# Patient Record
Sex: Female | Born: 1945
Health system: Southern US, Community
[De-identification: ages and names within clinical notes are randomized; demographics above are authoritative.]

## PROBLEM LIST (undated history)

## (undated) DIAGNOSIS — Z972 Presence of dental prosthetic device (complete) (partial): Secondary | ICD-10-CM

## (undated) DIAGNOSIS — D649 Anemia, unspecified: Secondary | ICD-10-CM

## (undated) DIAGNOSIS — K635 Polyp of colon: Secondary | ICD-10-CM

## (undated) DIAGNOSIS — E119 Type 2 diabetes mellitus without complications: Secondary | ICD-10-CM

## (undated) DIAGNOSIS — I10 Essential (primary) hypertension: Secondary | ICD-10-CM

## (undated) DIAGNOSIS — M1990 Unspecified osteoarthritis, unspecified site: Secondary | ICD-10-CM

## (undated) HISTORY — DX: Polyp of colon: K63.5

---

## 2014-06-02 DIAGNOSIS — E119 Type 2 diabetes mellitus without complications: Secondary | ICD-10-CM

## 2014-06-02 HISTORY — DX: Type 2 diabetes mellitus without complications: E11.9

## 2015-06-27 DIAGNOSIS — R030 Elevated blood-pressure reading, without diagnosis of hypertension: Secondary | ICD-10-CM | POA: Diagnosis not present

## 2015-06-27 DIAGNOSIS — J189 Pneumonia, unspecified organism: Secondary | ICD-10-CM | POA: Diagnosis not present

## 2015-06-29 DIAGNOSIS — I169 Hypertensive crisis, unspecified: Secondary | ICD-10-CM | POA: Diagnosis not present

## 2015-06-29 DIAGNOSIS — I10 Essential (primary) hypertension: Secondary | ICD-10-CM | POA: Diagnosis not present

## 2015-06-29 DIAGNOSIS — R062 Wheezing: Secondary | ICD-10-CM | POA: Diagnosis not present

## 2015-06-29 DIAGNOSIS — Z1239 Encounter for other screening for malignant neoplasm of breast: Secondary | ICD-10-CM | POA: Diagnosis not present

## 2015-06-29 DIAGNOSIS — D649 Anemia, unspecified: Secondary | ICD-10-CM | POA: Diagnosis not present

## 2015-07-03 ENCOUNTER — Other Ambulatory Visit: Payer: Self-pay | Admitting: Nurse Practitioner

## 2015-07-03 DIAGNOSIS — Z1239 Encounter for other screening for malignant neoplasm of breast: Secondary | ICD-10-CM

## 2015-07-05 DIAGNOSIS — I10 Essential (primary) hypertension: Secondary | ICD-10-CM | POA: Diagnosis not present

## 2015-07-05 DIAGNOSIS — D508 Other iron deficiency anemias: Secondary | ICD-10-CM | POA: Diagnosis not present

## 2015-07-11 ENCOUNTER — Ambulatory Visit
Admission: RE | Admit: 2015-07-11 | Discharge: 2015-07-11 | Disposition: A | Discharge status: Home / Self Care | Payer: Commercial Managed Care - HMO | Source: Ambulatory Visit | Attending: Nurse Practitioner | Admitting: Nurse Practitioner

## 2015-07-11 ENCOUNTER — Other Ambulatory Visit: Payer: Self-pay | Admitting: Nurse Practitioner

## 2015-07-11 DIAGNOSIS — Z1231 Encounter for screening mammogram for malignant neoplasm of breast: Secondary | ICD-10-CM | POA: Diagnosis not present

## 2015-07-11 DIAGNOSIS — Z1239 Encounter for other screening for malignant neoplasm of breast: Secondary | ICD-10-CM

## 2015-07-11 DIAGNOSIS — Z0000 Encounter for general adult medical examination without abnormal findings: Secondary | ICD-10-CM | POA: Diagnosis not present

## 2015-07-11 DIAGNOSIS — I10 Essential (primary) hypertension: Secondary | ICD-10-CM | POA: Diagnosis not present

## 2015-07-12 ENCOUNTER — Other Ambulatory Visit: Payer: Self-pay | Admitting: Nurse Practitioner

## 2015-07-12 DIAGNOSIS — R928 Other abnormal and inconclusive findings on diagnostic imaging of breast: Secondary | ICD-10-CM

## 2015-07-26 ENCOUNTER — Ambulatory Visit
Admission: RE | Admit: 2015-07-26 | Discharge: 2015-07-26 | Disposition: A | Discharge status: Home / Self Care | Payer: Commercial Managed Care - HMO | Source: Ambulatory Visit | Attending: Nurse Practitioner | Admitting: Nurse Practitioner

## 2015-07-26 DIAGNOSIS — R928 Other abnormal and inconclusive findings on diagnostic imaging of breast: Secondary | ICD-10-CM | POA: Diagnosis not present

## 2015-07-26 DIAGNOSIS — N6489 Other specified disorders of breast: Secondary | ICD-10-CM | POA: Diagnosis not present

## 2015-10-09 DIAGNOSIS — D509 Iron deficiency anemia, unspecified: Secondary | ICD-10-CM | POA: Diagnosis not present

## 2015-10-09 DIAGNOSIS — I10 Essential (primary) hypertension: Secondary | ICD-10-CM | POA: Diagnosis not present

## 2015-10-09 DIAGNOSIS — E119 Type 2 diabetes mellitus without complications: Secondary | ICD-10-CM | POA: Diagnosis not present

## 2015-10-09 DIAGNOSIS — Z79899 Other long term (current) drug therapy: Secondary | ICD-10-CM | POA: Diagnosis not present

## 2015-10-09 DIAGNOSIS — E782 Mixed hyperlipidemia: Secondary | ICD-10-CM | POA: Diagnosis not present

## 2015-10-15 DIAGNOSIS — Z79899 Other long term (current) drug therapy: Secondary | ICD-10-CM | POA: Diagnosis not present

## 2015-10-15 DIAGNOSIS — E782 Mixed hyperlipidemia: Secondary | ICD-10-CM | POA: Diagnosis not present

## 2015-10-15 DIAGNOSIS — I10 Essential (primary) hypertension: Secondary | ICD-10-CM | POA: Diagnosis not present

## 2015-10-15 DIAGNOSIS — Z1211 Encounter for screening for malignant neoplasm of colon: Secondary | ICD-10-CM | POA: Diagnosis not present

## 2015-10-15 DIAGNOSIS — E119 Type 2 diabetes mellitus without complications: Secondary | ICD-10-CM | POA: Diagnosis not present

## 2015-10-15 DIAGNOSIS — D649 Anemia, unspecified: Secondary | ICD-10-CM | POA: Diagnosis not present

## 2015-11-13 DIAGNOSIS — D649 Anemia, unspecified: Secondary | ICD-10-CM | POA: Diagnosis not present

## 2015-11-16 DIAGNOSIS — Z1211 Encounter for screening for malignant neoplasm of colon: Secondary | ICD-10-CM | POA: Diagnosis not present

## 2015-11-16 DIAGNOSIS — Z800 Family history of malignant neoplasm of digestive organs: Secondary | ICD-10-CM | POA: Diagnosis not present

## 2016-01-09 ENCOUNTER — Ambulatory Visit: Payer: Commercial Managed Care - HMO | Admitting: Anesthesiology

## 2016-01-09 ENCOUNTER — Encounter: Payer: Self-pay | Admitting: *Deleted

## 2016-01-09 ENCOUNTER — Encounter
Admission: RE | Disposition: A | Discharge status: Home / Self Care | Payer: Self-pay | Source: Ambulatory Visit | Attending: Gastroenterology

## 2016-01-09 ENCOUNTER — Ambulatory Visit
Admission: RE | Admit: 2016-01-09 | Discharge: 2016-01-09 | Disposition: A | Discharge status: Home / Self Care | Payer: Commercial Managed Care - HMO | Source: Ambulatory Visit | Attending: Gastroenterology | Admitting: Gastroenterology

## 2016-01-09 DIAGNOSIS — D125 Benign neoplasm of sigmoid colon: Secondary | ICD-10-CM | POA: Diagnosis not present

## 2016-01-09 DIAGNOSIS — I10 Essential (primary) hypertension: Secondary | ICD-10-CM | POA: Diagnosis not present

## 2016-01-09 DIAGNOSIS — E119 Type 2 diabetes mellitus without complications: Secondary | ICD-10-CM | POA: Diagnosis not present

## 2016-01-09 DIAGNOSIS — D649 Anemia, unspecified: Secondary | ICD-10-CM | POA: Diagnosis not present

## 2016-01-09 DIAGNOSIS — D126 Benign neoplasm of colon, unspecified: Secondary | ICD-10-CM | POA: Diagnosis not present

## 2016-01-09 DIAGNOSIS — Z1211 Encounter for screening for malignant neoplasm of colon: Secondary | ICD-10-CM | POA: Insufficient documentation

## 2016-01-09 DIAGNOSIS — K5730 Diverticulosis of large intestine without perforation or abscess without bleeding: Secondary | ICD-10-CM | POA: Diagnosis not present

## 2016-01-09 DIAGNOSIS — D123 Benign neoplasm of transverse colon: Secondary | ICD-10-CM | POA: Insufficient documentation

## 2016-01-09 DIAGNOSIS — Z79899 Other long term (current) drug therapy: Secondary | ICD-10-CM | POA: Insufficient documentation

## 2016-01-09 DIAGNOSIS — Z7984 Long term (current) use of oral hypoglycemic drugs: Secondary | ICD-10-CM | POA: Insufficient documentation

## 2016-01-09 DIAGNOSIS — K5289 Other specified noninfective gastroenteritis and colitis: Secondary | ICD-10-CM | POA: Diagnosis not present

## 2016-01-09 DIAGNOSIS — K529 Noninfective gastroenteritis and colitis, unspecified: Secondary | ICD-10-CM | POA: Insufficient documentation

## 2016-01-09 DIAGNOSIS — D127 Benign neoplasm of rectosigmoid junction: Secondary | ICD-10-CM | POA: Diagnosis not present

## 2016-01-09 DIAGNOSIS — K635 Polyp of colon: Secondary | ICD-10-CM | POA: Diagnosis not present

## 2016-01-09 DIAGNOSIS — K6389 Other specified diseases of intestine: Secondary | ICD-10-CM | POA: Diagnosis not present

## 2016-01-09 DIAGNOSIS — Z800 Family history of malignant neoplasm of digestive organs: Secondary | ICD-10-CM | POA: Diagnosis not present

## 2016-01-09 HISTORY — PX: COLONOSCOPY WITH PROPOFOL: SHX5780

## 2016-01-09 HISTORY — DX: Type 2 diabetes mellitus without complications: E11.9

## 2016-01-09 HISTORY — DX: Anemia, unspecified: D64.9

## 2016-01-09 HISTORY — DX: Essential (primary) hypertension: I10

## 2016-01-09 LAB — GLUCOSE, CAPILLARY: GLUCOSE-CAPILLARY: 123 mg/dL — AB (ref 65–99)

## 2016-01-09 SURGERY — COLONOSCOPY WITH PROPOFOL
Anesthesia: General

## 2016-01-09 MED ORDER — FENTANYL CITRATE (PF) 100 MCG/2ML IJ SOLN
INTRAMUSCULAR | Status: DC | PRN
  Administered 2016-01-09 (×2): 50 ug via INTRAVENOUS

## 2016-01-09 MED ORDER — PROPOFOL 500 MG/50ML IV EMUL
INTRAVENOUS | Status: DC | PRN
  Administered 2016-01-09: 120 ug/kg/min via INTRAVENOUS

## 2016-01-09 MED ORDER — MIDAZOLAM HCL 2 MG/2ML IJ SOLN
INTRAMUSCULAR | Status: DC | PRN
  Administered 2016-01-09: 1 mg via INTRAVENOUS

## 2016-01-09 MED ORDER — SODIUM CHLORIDE 0.9 % IV SOLN: INTRAVENOUS | Status: DC

## 2016-01-09 MED ORDER — PROPOFOL 10 MG/ML IV BOLUS
INTRAVENOUS | Status: DC | PRN
  Administered 2016-01-09: 30 mg via INTRAVENOUS

## 2016-01-09 MED ORDER — PHENYLEPHRINE HCL 10 MG/ML IJ SOLN
INTRAMUSCULAR | Status: DC | PRN
  Administered 2016-01-09 (×2): 100 ug via INTRAVENOUS

## 2016-01-09 MED ORDER — SODIUM CHLORIDE 0.9 % IV SOLN
INTRAVENOUS | Status: DC
  Administered 2016-01-09: 10:00:00 via INTRAVENOUS

## 2016-01-09 NOTE — Op Note (Signed)
[facility] Gastroenterology Patient Name: [name] Procedure Date: 01/09/2016 10:01 AM MRN: [id number] Account #: [id number] Date of Birth: 1945/11/12 Admit Type: Outpatient Age: 70 Room: [facility] ENDO ROOM 1 Gender: Female Note Status: Finalized Procedure:            Colonoscopy Indications:          Screening in patient at increased risk: Family history                        of 1st-degree relative with colorectal cancer, This is                        the patient's first colonoscopy Providers:            Lollie Sails, MD Referring MD:         Cletis Athens, MD (Referring MD) Medicines:            Monitored Anesthesia Care Complications:        No immediate complications. Procedure:            Pre-Anesthesia Assessment:                       - ASA Grade Assessment: II - A patient with mild                        systemic disease.                       After obtaining informed consent, the colonoscope was                        passed under direct vision. Throughout the procedure,                        the patient's blood pressure, pulse, and oxygen                        saturations were monitored continuously. The                        Colonoscope was introduced through the anus and                        advanced to the the cecum, identified by appendiceal                        orifice and ileocecal valve. The colonoscopy was                        performed with moderate difficulty due to poor bowel                        prep. Successful completion of the procedure was aided                        by lavage. Findings:      A 4 mm polyp was found in the recto-sigmoid colon. The polyp was       sessile. The polyp was removed with a cold snare. Resection and       retrieval were complete.  mucosal tag, noted at distal transverse colon, Biopsies were taken with       a cold forceps for histology. To prevent bleeding after the biopsy, one   hemostatic clip was successfully placed. There was no bleeding at the       end of the maneuver.      A 6 mm polyp was found in the proximal transverse colon. The polyp was       sessile. The polyp was removed with a cold snare. Resection and       retrieval were complete. To prevent bleeding after the polypectomy, two       hemostatic clips were successfully placed. There was no bleeding at the       end of the maneuver.      A 2 mm polyp was found in the proximal transverse colon. The polyp was       sessile. The polyp was removed with a cold biopsy forceps. Resection and       retrieval were complete.      An infiltrative and polypoid non-obstructing medium-sized mass was found       from 20 to 25 cm proximal to the anus. The mass was non-circumferential.       The mass measured three cm in length. Biopsies were taken with a cold       forceps for histology. Area was tattooed with an injection of 2 mL of       [place] ink.      A few small-mouthed diverticula were found in the sigmoid colon and       descending colon.      The retroflexed view of the distal rectum and anal verge was normal and       showed no anal or rectal abnormalities.      The digital rectal exam was normal. Impression:           - One 4 mm polyp at the recto-sigmoid colon, removed                        with a cold snare. Resected and retrieved.                       - One 6 mm polyp in the proximal transverse colon,                        removed with a cold snare. Resected and retrieved.                        Clips were placed.                       - One 2 mm polyp in the proximal transverse colon,                        removed with a cold biopsy forceps. Resected and                        retrieved.                       - Rule out malignancy, tumor from 20 to 25 cm proximal  to the anus. Biopsied.                       - Diverticulosis in the sigmoid colon and in the                         descending colon.                       - The distal rectum and anal verge are normal on                        retroflexion view. Recommendation:       - Await pathology results.                       - Return to GI clinic in 1 week. Procedure Code(s):    --- Professional ---                       [phone], Colonoscopy, flexible; with removal of tumor(s),                        polyp(s), or other lesion(s) by snare technique                       45380, 85, Colonoscopy, flexible; with biopsy, single                        or multiple                       45381, Colonoscopy, flexible; with directed submucosal                        injection(s), any substance Diagnosis Code(s):    --- Professional ---                       Z80.0, Family history of malignant neoplasm of                        digestive organs                       D12.7, Benign neoplasm of rectosigmoid junction                       D12.3, Benign neoplasm of transverse colon (hepatic                        flexure or splenic flexure)                       D49.0, Neoplasm of unspecified behavior of digestive                        system                       K57.30, Diverticulosis of large intestine without                        perforation or abscess without bleeding CPT copyright 2016 American Medical Association. All rights reserved. The codes  documented in this report are preliminary and upon coder review may  be revised to meet current compliance requirements. Lollie Sails, MD 01/09/2016 11:04:20 AM This report has been signed electronically. Number of Addenda: 0 Note Initiated On: 01/09/2016 10:01 AM Scope Withdrawal Time: 0 hours 28 minutes 40 seconds  Total Procedure Duration: 0 hours 48 minutes 26 seconds       [facility]

## 2016-01-09 NOTE — OR Nursing (Signed)
Copy of full liquid and low residue diet given to patient and reviewed along with discharge instructions.

## 2016-01-09 NOTE — Transfer of Care (Signed)
Immediate Anesthesia Transfer of Care Note  Patient: [name]  Procedure(s) Performed: Procedure(s): COLONOSCOPY WITH PROPOFOL (N/A)  Patient Location: PACU  Anesthesia Type:General  Level of Consciousness: awake  Airway & Oxygen Therapy: Patient Spontanous Breathing and Patient connected to nasal cannula oxygen  Post-op Assessment: Report given to RN  Post vital signs: Reviewed  Last Vitals:  Vitals:   01/09/16 0945  BP: (!) 178/99  Pulse: (!) 106  Resp: 20  Temp: 36.4 C    Last Pain:  Vitals:   01/09/16 0945  TempSrc: Tympanic         Complications: No apparent anesthesia complications

## 2016-01-09 NOTE — Anesthesia Postprocedure Evaluation (Signed)
Anesthesia Post Note  Patient: [name]  Procedure(s) Performed: Procedure(s) (LRB): COLONOSCOPY WITH PROPOFOL (N/A)  Patient location during evaluation: PACU Anesthesia Type: General Level of consciousness: awake Pain management: pain level controlled Respiratory status: spontaneous breathing Cardiovascular status: stable Anesthetic complications: no    Last Vitals:  Vitals:   01/09/16 1118 01/09/16 1128  BP: (!) 155/73 133/72  Pulse: 82 74  Resp: 14 13  Temp:      Last Pain:  Vitals:   01/09/16 1108  TempSrc:   PainSc: Asleep                 VAN STAVEREN,Sueanne Maniaci

## 2016-01-09 NOTE — H&P (Signed)
Outpatient short stay form Pre-procedure 01/09/2016 10:01 AM Lollie Sails MD  Primary Physician: Barry Dienes NP  Reason for visit:  Colon cancer screening  History of present illness:  Patient is a 70 year old female presenting today for colonoscopy. She has never had a colonoscopy before. There is a family history of colon cancer in a primary relative, sister. She tolerated her prep well. She takes no aspirin or blood thinning agents.    Current Facility-Administered Medications:  .  0.9 %  sodium chloride infusion, , Intravenous, Continuous, Lollie Sails, MD, Last Rate: 20 mL/hr at 01/09/16 0957 .  0.9 %  sodium chloride infusion, , Intravenous, Continuous, Lollie Sails, MD  Prescriptions Prior to Admission  Medication Sig Dispense Refill Last Dose  . amLODipine (NORVASC) 5 MG tablet Take 5 mg by mouth daily.   01/08/2016 at Unknown time  . ferrous sulfate 325 (65 FE) MG tablet Take 325 mg by mouth daily with breakfast.   Past Week at Unknown time  . hydrochlorothiazide (HYDRODIURIL) 25 MG tablet Take 25 mg by mouth daily.   01/09/2016 at 0600  . lisinopril (PRINIVIL,ZESTRIL) 40 MG tablet Take 40 mg by mouth daily.   01/08/2016 at Unknown time  . metFORMIN (GLUCOPHAGE) 500 MG tablet Take by mouth 2 (two) times daily with a meal.   01/08/2016 at Unknown time     No Known Allergies   Past Medical History:  Diagnosis Date  . Anemia   . Diabetes mellitus without complication ([place])   . Hypertension     Review of systems:      Physical Exam    Heart and lungs: Regular rate and rhythm without rub or gallop, lungs are bilaterally clear    HEENT: Normocephalic atraumatic eyes are anicteric    Other:     Pertinant exam for procedure: Soft nontender nondistended bowel sounds positive normoactive.    Planned proceedures: Colonoscopy and indicated procedures. I have discussed the risks benefits and complications of procedures to include not limited to bleeding, infection,  perforation and the risk of sedation and the patient wishes to proceed.    Lollie Sails, MD Gastroenterology 01/09/2016  10:01 AM

## 2016-01-09 NOTE — Anesthesia Preprocedure Evaluation (Signed)
Anesthesia Evaluation  Patient identified by MRN, date of birth, ID band Patient awake    Reviewed: Allergy & Precautions, NPO status , Patient's Chart, lab work & pertinent test results  Airway Mallampati: II       Dental  (+) Upper Dentures, Lower Dentures   Pulmonary neg pulmonary ROS,    breath sounds clear to auscultation       Cardiovascular Exercise Tolerance: Good hypertension, Pt. on medications  Rhythm:Regular     Neuro/Psych    GI/Hepatic negative GI ROS, Neg liver ROS,   Endo/Other  diabetes, Type 2, Oral Hypoglycemic Agents  Renal/GU negative Renal ROS     Musculoskeletal   Abdominal Normal abdominal exam  (+)   Peds  Hematology  (+) anemia ,   Anesthesia Other Findings   Reproductive/Obstetrics                             Anesthesia Physical Anesthesia Plan  ASA: II  Anesthesia Plan: General   Post-op Pain Management:    Induction: Intravenous  Airway Management Planned: Natural Airway and Nasal Cannula  Additional Equipment:   Intra-op Plan:   Post-operative Plan:   Informed Consent: I have reviewed the patients History and Physical, chart, labs and discussed the procedure including the risks, benefits and alternatives for the proposed anesthesia with the patient or authorized representative who has indicated his/her understanding and acceptance.     Plan Discussed with:   Anesthesia Plan Comments:         Anesthesia Quick Evaluation

## 2016-01-10 ENCOUNTER — Encounter: Payer: Self-pay | Admitting: Gastroenterology

## 2016-01-10 LAB — SURGICAL PATHOLOGY

## 2016-01-16 ENCOUNTER — Encounter: Payer: Self-pay | Admitting: *Deleted

## 2016-01-16 DIAGNOSIS — I10 Essential (primary) hypertension: Secondary | ICD-10-CM | POA: Insufficient documentation

## 2016-01-16 DIAGNOSIS — D126 Benign neoplasm of colon, unspecified: Secondary | ICD-10-CM | POA: Diagnosis not present

## 2016-01-16 DIAGNOSIS — Z800 Family history of malignant neoplasm of digestive organs: Secondary | ICD-10-CM | POA: Diagnosis not present

## 2016-01-17 DIAGNOSIS — D126 Benign neoplasm of colon, unspecified: Secondary | ICD-10-CM | POA: Diagnosis not present

## 2016-01-17 DIAGNOSIS — E119 Type 2 diabetes mellitus without complications: Secondary | ICD-10-CM | POA: Diagnosis not present

## 2016-01-21 ENCOUNTER — Encounter (INDEPENDENT_AMBULATORY_CARE_PROVIDER_SITE_OTHER): Payer: Self-pay

## 2016-01-22 ENCOUNTER — Encounter: Payer: Self-pay | Admitting: General Surgery

## 2016-01-22 ENCOUNTER — Ambulatory Visit (INDEPENDENT_AMBULATORY_CARE_PROVIDER_SITE_OTHER): Payer: Commercial Managed Care - HMO | Admitting: General Surgery

## 2016-01-22 VITALS — BP 162/92 | HR 78 | Resp 14 | Ht 63.0 in | Wt 127.0 lb

## 2016-01-22 DIAGNOSIS — D125 Benign neoplasm of sigmoid colon: Secondary | ICD-10-CM | POA: Diagnosis not present

## 2016-01-22 DIAGNOSIS — K635 Polyp of colon: Secondary | ICD-10-CM

## 2016-01-22 NOTE — Progress Notes (Signed)
Patient ID: [name], female   DOB: 08/15/1945, 70 y.o.   MRN: [id number]  Chief Complaint  Patient presents with  . Mass    colon    HPI [name] is a 70 y.o. female here today for a evaluation of a colon mass.Patient had a colonoscopy done on 01/09/16. Path showed tubular adenoma. This was not amenable to removal via colonoscope. Denies any gastrointestinal issues. Bowels move daily, no bleeding. She had a sister with colon cancer. Her primary physician found blood in her stool at an office visit back in July. She is here today with her sister in law, [name]. I have reviewed the history of present illness with the patient.  HPI  Past Medical History:  Diagnosis Date  . Anemia   . Diabetes mellitus without complication ([place])   . Hypertension     Past Surgical History:  Procedure Laterality Date  . COLONOSCOPY WITH PROPOFOL N/A 01/09/2016   Procedure: COLONOSCOPY WITH PROPOFOL;  Surgeon: Lollie Sails, MD;  Location: [facility] ENDOSCOPY;  Service: Endoscopy;  Laterality: N/A;    Family History  Problem Relation Age of Onset  . Breast cancer Sister   . Colon cancer Sister     Social History Social History  Substance Use Topics  . Smoking status: Never Smoker  . Smokeless tobacco: Never Used  . Alcohol use No    No Known Allergies  Current Outpatient Prescriptions  Medication Sig Dispense Refill  . amLODipine (NORVASC) 5 MG tablet Take 5 mg by mouth daily.    . ferrous sulfate 325 (65 FE) MG tablet Take 325 mg by mouth daily with breakfast.    . hydrochlorothiazide (HYDRODIURIL) 25 MG tablet Take 25 mg by mouth daily.    Marland Kitchen lisinopril (PRINIVIL,ZESTRIL) 40 MG tablet Take 40 mg by mouth daily.    . metFORMIN (GLUCOPHAGE) 500 MG tablet Take by mouth 2 (two) times daily with a meal.     No current facility-administered medications for this visit.     Review of Systems Review of Systems  Constitutional: Negative.   Respiratory: Negative.    Cardiovascular: Negative.     Blood pressure (!) 162/92, pulse 78, resp. rate 14, height 5\' 3"  (1.6 m), weight 127 lb (57.6 kg).  Physical Exam Physical Exam  Constitutional: She is oriented to person, place, and time. She appears well-developed and well-nourished.  HENT:  Mouth/Throat: Oropharynx is clear and moist.  Eyes: Conjunctivae are normal. No scleral icterus.  Neck: Neck supple.  Cardiovascular: Normal rate, regular rhythm and normal heart sounds.   Pulmonary/Chest: Effort normal and breath sounds normal.  Abdominal: Soft. Normal appearance and bowel sounds are normal. There is no hepatosplenomegaly. There is no tenderness.  Lymphadenopathy:    She has no cervical adenopathy.  Neurological: She is alert and oriented to person, place, and time.  Skin: Skin is warm and dry.  Psychiatric: Her behavior is normal.    Data Reviewed Colonoscopy and progress notes.  Assessment    Sigmoid colon polyp-biopsy showed tubular adenoma. Polyp appears too big for removal bo colonoscope. Given FH of colon cancer surgical resection is reasonable.     Plan    Discussed in detail risk and benefits of colon resection to completely remove colon polyp, patient agrees. Procedure explained to her.    The patient is scheduled for surgery at [facility] on 01/30/16. She will pre admit by phone. The patient is aware of date and instructions.  This information has been scribed by Rosann Auerbach  Hatch RN, BSN,BC.   Yanelli Zapanta G 01/22/2016, 10:12 AM

## 2016-01-22 NOTE — Patient Instructions (Addendum)
The patient is aware to call back for any questions or concerns. Laparoscopic Colectomy Laparoscopic colectomy is surgery to remove part or all of the large intestine (colon). This procedure is used to treat several conditions, including:  Inflammation and infection of the colon (diverticulitis).  Tumors or masses in the colon.  Inflammatory bowel disease, such as Crohn disease or ulcerative colitis. Colectomy is an option when symptoms cannot be controlled with medicines.  Bleeding from the colon that cannot be controlled by another method.  Blockage or obstruction of the colon. LET [facility] CARE PROVIDER KNOW ABOUT:  Any allergies you have.  All medicines you are taking, including vitamins, herbs, eye drops, creams, and over-the-counter medicines.  Previous problems you or members of your family have had with the use of anesthetics.  Any blood disorders you have.  Previous surgeries you have had.  Medical conditions you have. RISKS AND COMPLICATIONS Generally, this is a safe procedure. However, as with any procedure, complications can occur. Possible complications include:  Infection.  Bleeding.  Damage to other organs.  Leaking from where the colon was sewn together.  Future blockage of the small intestines from scar tissue. Another surgery may be needed to repair this. In some cases, complications such as damage to other organs or excessive bleeding may require the surgeon to convert from a laparoscopic procedure to an open procedure. This involves making a larger incision in the abdomen to perform the procedure. BEFORE THE PROCEDURE  Ask your health care provider about changing or stopping any regular medicines.  You may be prescribed an oral bowel prep. This involves drinking a large amount of medicated liquid, starting the day before your surgery. The liquid will cause you to have multiple loose stools until your stool is almost clear or light green. This cleans  out your colon in preparation for the surgery.  Do not eat or drink anything else once you have started the bowel prep, unless your health care provider tells you it is safe to do so.  You may also be given antibiotic pills to clean out your colon of bacteria. Be sure to follow the directions carefully and take the medicine at the correct time. PROCEDURE   Small monitors will be put on your body. They are used to check your heart, blood pressure, and oxygen level.  An IV access tube will be put into one of your veins. Medicine will be able to flow directly into your body through this IV tube.  You might be given a medicine to help you relax (sedative).  You will be given a medicine to make you sleep through the procedure (general anesthetic). A breathing tube may be placed into your lungs during the procedure.  A thin, flexible tube (catheter) will be placed into your bladder to collect urine.  A tube may be put in through your nose. It is called a nasogastric tube. It is used to remove stomach fluids after surgery until the intestines start working again.  Your abdomen will be filled with air so that it expands. This gives the surgeon more room to operate and makes your organs easier to see.  Several small cuts (incisions) are made in your abdomen.  A thin, lighted tube with a tiny camera on the end (laparoscope) is put through one of the small incisions. The camera on the laparoscope sends a picture to a TV screen in the operating room. This gives the surgeon a good view inside your abdomen.  Hollow tubes  are put through the other small incisions in your abdomen. The tools needed for the procedure are put through these tubes.  Clamps or staples are put on both ends of the diseased part of the colon.  The part of the intestine between the clamps or staples is removed.  If possible, the ends of the healthy colon that remain will be stitched or stapled together to allow your body to  expel waste (stool).  Sometimes, the remaining colon cannot be stitched back together. If this is the case, a colostomy is needed. For a colostomy:  An opening (stoma) to the outside of your body is made through the abdomen.  The end of the colon is brought to the opening. It is stitched to the skin.  A bag is attached to the opening. Stool will drain into this bag. The bag is removable.  The colostomy can be temporary or permanent.  The incisions from the colectomy are closed with stitches or staples. AFTER THE PROCEDURE  You will be monitored closely in a recovery area until you are stable and doing well. You will then be moved to a regular hospital room.  You will need to receive fluids through an IV tube until your bowel function has returned. This may take 1-3 days. Once your bowels are working again, you will be started on clear liquids and then advanced to solid food as tolerated.  You will be given pain medicines to control your pain.   This information is not intended to replace advice given to you by your health care provider. Make sure you discuss any questions you have with your health care provider.   Document Released: 08/09/2002 Document Revised: 03/09/2013 Document Reviewed: 12/29/2012 Elsevier Interactive Patient Education [employer].  The patient is scheduled for surgery at [facility] on 01/30/16. She will pre admit by phone. The patient is aware of date and instructions.

## 2016-01-23 ENCOUNTER — Telehealth: Payer: Self-pay | Admitting: *Deleted

## 2016-01-23 ENCOUNTER — Encounter
Admission: RE | Admit: 2016-01-23 | Discharge: 2016-01-23 | Disposition: A | Discharge status: Home / Self Care | Payer: Commercial Managed Care - HMO | Source: Ambulatory Visit | Attending: General Surgery | Admitting: General Surgery

## 2016-01-23 HISTORY — DX: Unspecified osteoarthritis, unspecified site: M19.90

## 2016-01-23 LAB — BASIC METABOLIC PANEL
BUN / CREAT RATIO: 26 (ref 12–28)
BUN: 29 mg/dL — ABNORMAL HIGH (ref 8–27)
CHLORIDE: 102 mmol/L (ref 96–106)
CO2: 23 mmol/L (ref 18–29)
Calcium: 10.2 mg/dL (ref 8.7–10.3)
Creatinine, Ser: 1.11 mg/dL — ABNORMAL HIGH (ref 0.57–1.00)
GFR calc non Af Amer: 51 mL/min/{1.73_m2} — ABNORMAL LOW (ref >59)
GFR, EST AFRICAN AMERICAN: 59 mL/min/{1.73_m2} — AB (ref >59)
GLUCOSE: 151 mg/dL — AB (ref 65–99)
POTASSIUM: 5.1 mmol/L (ref 3.5–5.2)
Sodium: 139 mmol/L (ref 134–144)

## 2016-01-23 LAB — CBC WITH DIFFERENTIAL/PLATELET
BASOS: 0 %
Basophils Absolute: 0 10*3/uL (ref 0.0–0.2)
EOS (ABSOLUTE): 0.1 10*3/uL (ref 0.0–0.4)
EOS: 1 %
HEMATOCRIT: 29.9 % — AB (ref 34.0–46.6)
Hemoglobin: 9.9 g/dL — ABNORMAL LOW (ref 11.1–15.9)
IMMATURE GRANS (ABS): 0 10*3/uL (ref 0.0–0.1)
IMMATURE GRANULOCYTES: 0 %
LYMPHS: 44 %
Lymphocytes Absolute: 3.8 10*3/uL — ABNORMAL HIGH (ref 0.7–3.1)
MCH: 26.5 pg — ABNORMAL LOW (ref 26.6–33.0)
MCHC: 33.1 g/dL (ref 31.5–35.7)
MCV: 80 fL (ref 79–97)
Monocytes Absolute: 0.4 10*3/uL (ref 0.1–0.9)
Monocytes: 4 %
NEUTROS PCT: 51 %
Neutrophils Absolute: 4.4 10*3/uL (ref 1.4–7.0)
PLATELETS: 250 10*3/uL (ref 150–379)
RBC: 3.73 x10E6/uL — ABNORMAL LOW (ref 3.77–5.28)
RDW: 12.5 % (ref 12.3–15.4)
WBC: 8.7 10*3/uL (ref 3.4–10.8)

## 2016-01-23 LAB — CEA: CEA: 2.7 ng/mL (ref 0.0–4.7)

## 2016-01-23 NOTE — Telephone Encounter (Signed)
Notified patient as instructed, patient pleased. Discussed follow-up appointments, patient agrees  

## 2016-01-23 NOTE — Progress Notes (Signed)
Inform pt labs are normal. Hgb is 9.7- stable.Tell her to drink more fluids.

## 2016-01-23 NOTE — Patient Instructions (Signed)
  Your procedure is scheduled on: 01-30-16 Report to Same Day Surgery 2nd floor medical mall To find out your arrival time please call [phone] between 1PM - 3PM on 01-29-16  Remember: Instructions that are not followed completely may result in serious medical risk, up to and including death, or upon the discretion of your surgeon and anesthesiologist your surgery may need to be rescheduled.    _x___ 1. Do not eat food or drink liquids after midnight. No gum chewing or hard candies.     __x__ 2. No Alcohol for 24 hours before or after surgery.   __x__3. No Smoking for 24 prior to surgery.   ____  4. Bring all medications with you on the day of surgery if instructed.    __x__ 5. Notify your doctor if there is any change in your medical condition     (cold, fever, infections).     Do not wear jewelry, make-up, hairpins, clips or nail polish.  Do not wear lotions, powders, or perfumes. You may wear deodorant.  Do not shave 48 hours prior to surgery. Men may shave face and neck.  Do not bring valuables to the hospital.    [facility] is not responsible for any belongings or valuables.               Contacts, dentures or bridgework may not be worn into surgery.  Leave your suitcase in the car. After surgery it may be brought to your room.  For patients admitted to the hospital, discharge time is determined by your treatment team.   Patients discharged the day of surgery will not be allowed to drive home.    Please read over the following fact sheets that you were given:    _x___ Take these medicines the morning of surgery with A SIP OF WATER:    1. AMLODIPINE  2.  3.  4.  5.  6.  ____ Fleet Enema (as directed)   _x___ Use CHG Soap or sage wipes as directed on instruction sheet   ____ Use inhalers on the day of surgery and bring to hospital day of surgery  _X___ Stop metformin 2 days prior to surgery-LAST DOSE ON Sunday AUGUST 27TH    ____ Take 1/2 of usual insulin dose  the night before surgery and none on the morning of   surgery.   ____ Stop aspirin or coumadin, or plavix  _x__ Stop Anti-inflammatories such as Advil, Aleve, Ibuprofen, Motrin, Naproxen,          Naprosyn, Goodies powders or aspirin products. Ok to take Tylenol.   ____ Stop supplements until after surgery.    ____ Bring C-Pap to the hospital.

## 2016-01-23 NOTE — Telephone Encounter (Signed)
-----   Message from Christene Lye, MD sent at 01/23/2016  2:03 PM EDT ----- Inform pt labs are normal. Hgb is 9.7- stable.Tell her to drink more fluids.

## 2016-01-28 NOTE — Pre-Procedure Instructions (Signed)
Phone call to Dr. Jennette Kettle office to follow up on a request for EKG to be faxed to our office for upcoming surgery with Dr. Jamal Collin on 01/30/16.  Received Dr. Jennette Kettle nurse Ashely's voice mail, message for above left on voicemail.

## 2016-01-29 NOTE — Pre-Procedure Instructions (Signed)
Spoke with Melissa at Dr. Jennette Kettle office regarding faxing EKG to PAT for upcoming surgery with Dr. Jamal Collin.  Melissa said she would fax over the EKG tracing.

## 2016-01-29 NOTE — Pre-Procedure Instructions (Signed)
EKG tracing received, NSR no acute changes.

## 2016-01-30 ENCOUNTER — Encounter: Payer: Self-pay | Admitting: *Deleted

## 2016-01-30 ENCOUNTER — Encounter
Admission: RE | Disposition: A | Discharge status: Home / Self Care | Payer: Self-pay | Source: Ambulatory Visit | Attending: General Surgery

## 2016-01-30 ENCOUNTER — Inpatient Hospital Stay
Admission: RE | Admit: 2016-01-30 | Discharge: 2016-02-02 | DRG: 331 | Disposition: A | Discharge status: Home / Self Care | Payer: Commercial Managed Care - HMO | Source: Ambulatory Visit | Attending: General Surgery | Admitting: General Surgery

## 2016-01-30 ENCOUNTER — Inpatient Hospital Stay: Payer: Commercial Managed Care - HMO | Admitting: Anesthesiology

## 2016-01-30 DIAGNOSIS — Z800 Family history of malignant neoplasm of digestive organs: Secondary | ICD-10-CM

## 2016-01-30 DIAGNOSIS — Z853 Personal history of malignant neoplasm of breast: Secondary | ICD-10-CM

## 2016-01-30 DIAGNOSIS — K635 Polyp of colon: Secondary | ICD-10-CM

## 2016-01-30 DIAGNOSIS — Z79899 Other long term (current) drug therapy: Secondary | ICD-10-CM

## 2016-01-30 DIAGNOSIS — Z7984 Long term (current) use of oral hypoglycemic drugs: Secondary | ICD-10-CM | POA: Diagnosis not present

## 2016-01-30 DIAGNOSIS — Z9889 Other specified postprocedural states: Secondary | ICD-10-CM

## 2016-01-30 DIAGNOSIS — E119 Type 2 diabetes mellitus without complications: Secondary | ICD-10-CM | POA: Diagnosis present

## 2016-01-30 DIAGNOSIS — D125 Benign neoplasm of sigmoid colon: Principal | ICD-10-CM | POA: Diagnosis present

## 2016-01-30 DIAGNOSIS — I10 Essential (primary) hypertension: Secondary | ICD-10-CM | POA: Diagnosis present

## 2016-01-30 DIAGNOSIS — Z23 Encounter for immunization: Secondary | ICD-10-CM

## 2016-01-30 DIAGNOSIS — K660 Peritoneal adhesions (postprocedural) (postinfection): Secondary | ICD-10-CM | POA: Diagnosis not present

## 2016-01-30 HISTORY — DX: Polyp of colon: K63.5

## 2016-01-30 HISTORY — PX: LAPAROSCOPIC SIGMOID COLECTOMY: SHX5928

## 2016-01-30 LAB — CBC
HCT: 28.4 % — ABNORMAL LOW (ref 35.0–47.0)
HEMOGLOBIN: 9.9 g/dL — AB (ref 12.0–16.0)
MCH: 27.3 pg (ref 26.0–34.0)
MCHC: 34.8 g/dL (ref 32.0–36.0)
MCV: 78.7 fL — ABNORMAL LOW (ref 80.0–100.0)
Platelets: 203 10*3/uL (ref 150–440)
RBC: 3.61 MIL/uL — AB (ref 3.80–5.20)
RDW: 12.6 % (ref 11.5–14.5)
WBC: 11.9 10*3/uL — ABNORMAL HIGH (ref 3.6–11.0)

## 2016-01-30 LAB — GLUCOSE, CAPILLARY
GLUCOSE-CAPILLARY: 151 mg/dL — AB (ref 65–99)
Glucose-Capillary: 158 mg/dL — ABNORMAL HIGH (ref 65–99)

## 2016-01-30 LAB — CREATININE, SERUM
CREATININE: 0.97 mg/dL (ref 0.44–1.00)
GFR calc Af Amer: >60 mL/min (ref >60)
GFR, EST NON AFRICAN AMERICAN: 59 mL/min — AB (ref >60)

## 2016-01-30 LAB — MRSA PCR SCREENING: MRSA BY PCR: NEGATIVE

## 2016-01-30 SURGERY — COLECTOMY, SIGMOID, LAPAROSCOPIC
Anesthesia: General | Wound class: Clean Contaminated

## 2016-01-30 MED ORDER — ENOXAPARIN SODIUM 40 MG/0.4ML ~~LOC~~ SOLN
40.0000 mg | SUBCUTANEOUS | Status: DC
  Administered 2016-01-31 – 2016-02-02 (×3): 40 mg via SUBCUTANEOUS
  Filled 2016-01-30 (×3): qty 0.4

## 2016-01-30 MED ORDER — FENTANYL CITRATE (PF) 100 MCG/2ML IJ SOLN
INTRAMUSCULAR | Status: DC | PRN
  Administered 2016-01-30 (×4): 50 ug via INTRAVENOUS

## 2016-01-30 MED ORDER — FAMOTIDINE 20 MG PO TABS
20.0000 mg | ORAL_TABLET | Freq: Once | ORAL | Status: AC
  Administered 2016-01-30: 20 mg via ORAL

## 2016-01-30 MED ORDER — SUGAMMADEX SODIUM 200 MG/2ML IV SOLN
INTRAVENOUS | Status: DC | PRN
  Administered 2016-01-30: 110 mg via INTRAVENOUS

## 2016-01-30 MED ORDER — ONDANSETRON HCL 4 MG/2ML IJ SOLN: 4.0000 mg | Freq: Once | INTRAMUSCULAR | Status: DC | PRN

## 2016-01-30 MED ORDER — SODIUM CHLORIDE 0.9 % IV SOLN
INTRAVENOUS | Status: DC
  Administered 2016-01-30: 11:00:00 via INTRAVENOUS

## 2016-01-30 MED ORDER — LIDOCAINE HCL (CARDIAC) 20 MG/ML IV SOLN
INTRAVENOUS | Status: DC | PRN
  Administered 2016-01-30: 50 mg via INTRAVENOUS

## 2016-01-30 MED ORDER — LISINOPRIL 20 MG PO TABS
40.0000 mg | ORAL_TABLET | Freq: Every day | ORAL | Status: DC
  Administered 2016-01-30 – 2016-02-01 (×3): 40 mg via ORAL
  Filled 2016-01-30 (×3): qty 2

## 2016-01-30 MED ORDER — BUPIVACAINE HCL (PF) 0.5 % IJ SOLN
INTRAMUSCULAR | Status: DC | PRN
  Administered 2016-01-30: 30 mL

## 2016-01-30 MED ORDER — FENTANYL CITRATE (PF) 100 MCG/2ML IJ SOLN
INTRAMUSCULAR | Status: AC
  Filled 2016-01-30: qty 2

## 2016-01-30 MED ORDER — ROCURONIUM BROMIDE 100 MG/10ML IV SOLN
INTRAVENOUS | Status: DC | PRN
  Administered 2016-01-30: 50 mg via INTRAVENOUS

## 2016-01-30 MED ORDER — CHLORHEXIDINE GLUCONATE CLOTH 2 % EX PADS: 6.0000 | MEDICATED_PAD | Freq: Once | CUTANEOUS | Status: DC

## 2016-01-30 MED ORDER — ERTAPENEM SODIUM 1 G IJ SOLR
1.0000 g | INTRAMUSCULAR | Status: AC
  Administered 2016-01-30: 1 g via INTRAVENOUS
  Filled 2016-01-30: qty 1

## 2016-01-30 MED ORDER — FENTANYL CITRATE (PF) 100 MCG/2ML IJ SOLN
25.0000 ug | INTRAMUSCULAR | Status: DC | PRN
  Administered 2016-01-30 (×4): 25 ug via INTRAVENOUS

## 2016-01-30 MED ORDER — OXYCODONE HCL 5 MG PO TABS
5.0000 mg | ORAL_TABLET | ORAL | Status: DC | PRN
  Administered 2016-01-30: 5 mg via ORAL
  Filled 2016-01-30: qty 1

## 2016-01-30 MED ORDER — ACETAMINOPHEN 10 MG/ML IV SOLN
INTRAVENOUS | Status: AC
  Filled 2016-01-30: qty 100

## 2016-01-30 MED ORDER — ONDANSETRON HCL 4 MG/2ML IJ SOLN
4.0000 mg | Freq: Four times a day (QID) | INTRAMUSCULAR | Status: DC | PRN
Start: 2016-01-30 — End: 2016-02-02
  Administered 2016-01-30: 4 mg via INTRAVENOUS
  Filled 2016-01-30: qty 2

## 2016-01-30 MED ORDER — FAMOTIDINE 20 MG PO TABS
ORAL_TABLET | ORAL | Status: AC
  Administered 2016-01-30: 20 mg via ORAL
  Filled 2016-01-30: qty 1

## 2016-01-30 MED ORDER — BUPIVACAINE HCL (PF) 0.5 % IJ SOLN
INTRAMUSCULAR | Status: AC
  Filled 2016-01-30: qty 30

## 2016-01-30 MED ORDER — ACETAMINOPHEN 325 MG PO TABS: 650.0000 mg | ORAL_TABLET | Freq: Four times a day (QID) | ORAL | Status: DC | PRN

## 2016-01-30 MED ORDER — ALVIMOPAN 12 MG PO CAPS
ORAL_CAPSULE | ORAL | Status: AC
  Administered 2016-01-30: 12 mg via ORAL
  Filled 2016-01-30: qty 1

## 2016-01-30 MED ORDER — ACETAMINOPHEN 650 MG RE SUPP: 650.0000 mg | Freq: Four times a day (QID) | RECTAL | Status: DC | PRN

## 2016-01-30 MED ORDER — PANTOPRAZOLE SODIUM 40 MG PO TBEC
40.0000 mg | DELAYED_RELEASE_TABLET | Freq: Every day | ORAL | Status: DC
  Administered 2016-01-31 – 2016-02-02 (×3): 40 mg via ORAL
  Filled 2016-01-30 (×3): qty 1

## 2016-01-30 MED ORDER — HYDROCHLOROTHIAZIDE 25 MG PO TABS
25.0000 mg | ORAL_TABLET | Freq: Every day | ORAL | Status: DC
Start: 2016-01-30 — End: 2016-02-02
  Administered 2016-01-31 – 2016-02-02 (×3): 25 mg via ORAL
  Filled 2016-01-30 (×3): qty 1

## 2016-01-30 MED ORDER — FERROUS SULFATE 325 (65 FE) MG PO TABS
325.0000 mg | ORAL_TABLET | Freq: Every day | ORAL | Status: DC
  Administered 2016-01-31 – 2016-02-02 (×3): 325 mg via ORAL
  Filled 2016-01-30 (×3): qty 1

## 2016-01-30 MED ORDER — ALVIMOPAN 12 MG PO CAPS
12.0000 mg | ORAL_CAPSULE | Freq: Two times a day (BID) | ORAL | Status: DC
  Administered 2016-01-31 – 2016-02-02 (×5): 12 mg via ORAL
  Filled 2016-01-30 (×5): qty 1

## 2016-01-30 MED ORDER — PNEUMOCOCCAL VAC POLYVALENT 25 MCG/0.5ML IJ INJ
0.5000 mL | INJECTION | INTRAMUSCULAR | Status: AC
  Administered 2016-02-01: 0.5 mL via INTRAMUSCULAR
  Filled 2016-01-30 (×2): qty 0.5

## 2016-01-30 MED ORDER — ZOLPIDEM TARTRATE 5 MG PO TABS: 5.0000 mg | ORAL_TABLET | Freq: Every evening | ORAL | Status: DC | PRN

## 2016-01-30 MED ORDER — PROPOFOL 10 MG/ML IV BOLUS
INTRAVENOUS | Status: DC | PRN
Start: 2016-01-30 — End: 2016-01-30
  Administered 2016-01-30: 30 mg via INTRAVENOUS
  Administered 2016-01-30: 140 mg via INTRAVENOUS

## 2016-01-30 MED ORDER — ALVIMOPAN 12 MG PO CAPS
12.0000 mg | ORAL_CAPSULE | Freq: Once | ORAL | Status: AC
  Administered 2016-01-30: 12 mg via ORAL

## 2016-01-30 MED ORDER — SODIUM CHLORIDE 0.45 % IV SOLN
INTRAVENOUS | Status: DC
  Administered 2016-01-30: 1000 mL via INTRAVENOUS
  Administered 2016-01-30 – 2016-02-01 (×4): via INTRAVENOUS

## 2016-01-30 MED ORDER — MIDAZOLAM HCL 2 MG/2ML IJ SOLN
INTRAMUSCULAR | Status: DC | PRN
  Administered 2016-01-30: 2 mg via INTRAVENOUS

## 2016-01-30 MED ORDER — SUCCINYLCHOLINE CHLORIDE 20 MG/ML IJ SOLN
INTRAMUSCULAR | Status: DC | PRN
  Administered 2016-01-30: 100 mg via INTRAVENOUS

## 2016-01-30 MED ORDER — MORPHINE SULFATE (PF) 2 MG/ML IV SOLN
2.0000 mg | INTRAVENOUS | Status: DC | PRN
  Filled 2016-01-30: qty 1

## 2016-01-30 MED ORDER — ACETAMINOPHEN 10 MG/ML IV SOLN
INTRAVENOUS | Status: DC | PRN
  Administered 2016-01-30: 1000 mg via INTRAVENOUS

## 2016-01-30 MED ORDER — AMLODIPINE BESYLATE 5 MG PO TABS
5.0000 mg | ORAL_TABLET | ORAL | Status: DC
  Administered 2016-01-31 – 2016-02-02 (×3): 5 mg via ORAL
  Filled 2016-01-30 (×4): qty 1

## 2016-01-30 MED ORDER — LACTATED RINGERS IV SOLN
INTRAVENOUS | Status: DC | PRN
  Administered 2016-01-30: 14:00:00 via INTRAVENOUS

## 2016-01-30 MED ORDER — ONDANSETRON 4 MG PO TBDP: 4.0000 mg | ORAL_TABLET | Freq: Four times a day (QID) | ORAL | Status: DC | PRN

## 2016-01-30 MED ORDER — ONDANSETRON HCL 4 MG/2ML IJ SOLN
INTRAMUSCULAR | Status: DC | PRN
  Administered 2016-01-30: 4 mg via INTRAVENOUS

## 2016-01-30 SURGICAL SUPPLY — 82 items
APPLIER CLIP ROT 10 11.4 M/L (STAPLE) ×3
BLADE SURG 10 STRL SS SAFETY (BLADE) ×3 IMPLANT
BLADE SURG 11 STRL SS SAFETY (MISCELLANEOUS) ×3 IMPLANT
CANISTER SUCT 1200ML W/VALVE (MISCELLANEOUS) ×3 IMPLANT
CANNULA DILATOR 10 W/SLV (CANNULA) ×2 IMPLANT
CANNULA DILATOR 10MM W/SLV (CANNULA) ×1
CATH TRAY 16F METER LATEX (MISCELLANEOUS) ×3 IMPLANT
CHLORAPREP W/TINT 26ML (MISCELLANEOUS) ×3 IMPLANT
CLEANER CAUTERY TIP 5X5 PAD (MISCELLANEOUS) ×1 IMPLANT
CLIP APPLIE ROT 10 11.4 M/L (STAPLE) ×1 IMPLANT
COVER CLAMP SIL LG PBX B (MISCELLANEOUS) IMPLANT
DEFOGGER SCOPE WARMER CLEARIFY (MISCELLANEOUS) ×3 IMPLANT
DERMABOND ADVANCED (GAUZE/BANDAGES/DRESSINGS) ×2
DERMABOND ADVANCED .7 DNX12 (GAUZE/BANDAGES/DRESSINGS) ×1 IMPLANT
DEVICE HAND ACCESS DEXTUS (MISCELLANEOUS) IMPLANT
DRAPE INCISE IOBAN 66X45 STRL (DRAPES) ×3 IMPLANT
DRAPE LEGGINS SURG 28X43 STRL (DRAPES) ×3 IMPLANT
DRAPE PERI LITHO V/GYN (MISCELLANEOUS) ×3 IMPLANT
DRAPE UNDER BUTTOCK W/FLU (DRAPES) ×3 IMPLANT
DRSG OPSITE POSTOP 4X10 (GAUZE/BANDAGES/DRESSINGS) IMPLANT
DRSG OPSITE POSTOP 4X8 (GAUZE/BANDAGES/DRESSINGS) IMPLANT
DRSG TEGADERM 2-3/8X2-3/4 SM (GAUZE/BANDAGES/DRESSINGS) IMPLANT
DRSG TEGADERM 4X4.75 (GAUZE/BANDAGES/DRESSINGS) IMPLANT
DRSG TELFA 3X8 NADH (GAUZE/BANDAGES/DRESSINGS) IMPLANT
DRSG TELFA 4X8 ISLAND PHMB (GAUZE/BANDAGES/DRESSINGS) IMPLANT
ELECT BLADE 6.5 EXT (BLADE) ×3 IMPLANT
ELECT REM PT RETURN 9FT ADLT (ELECTROSURGICAL) ×3
ELECTRODE REM PT RTRN 9FT ADLT (ELECTROSURGICAL) ×1 IMPLANT
FILTER LAP SMOKE EVAC STRL (MISCELLANEOUS) ×3 IMPLANT
GLOVE BIO SURGEON STRL SZ7 (GLOVE) ×12 IMPLANT
GLOVE BIO SURGEON STRL SZ7.5 (GLOVE) ×12 IMPLANT
GLOVE INDICATOR 8.0 STRL GRN (GLOVE) ×6 IMPLANT
GOWN STRL REUS W/ TWL LRG LVL3 (GOWN DISPOSABLE) ×5 IMPLANT
GOWN STRL REUS W/TWL LRG LVL3 (GOWN DISPOSABLE) ×10
HANDLE YANKAUER SUCT BULB TIP (MISCELLANEOUS) ×3 IMPLANT
IRRIGATION STRYKERFLOW (MISCELLANEOUS) ×1 IMPLANT
IRRIGATOR STRYKERFLOW (MISCELLANEOUS) ×3
IV LACTATED RINGERS 1000ML (IV SOLUTION) ×3 IMPLANT
LABEL OR SOLS (LABEL) ×3 IMPLANT
NDL INSUFF ACCESS 14 VERSASTEP (NEEDLE) ×3 IMPLANT
NEEDLE HYPO 22GX1.5 SAFETY (NEEDLE) ×3 IMPLANT
NS IRRIG 1000ML POUR BTL (IV SOLUTION) ×3 IMPLANT
NS IRRIG 500ML POUR BTL (IV SOLUTION) ×3 IMPLANT
PACK COLON CLEAN CLOSURE (MISCELLANEOUS) ×3 IMPLANT
PACK LAP CHOLECYSTECTOMY (MISCELLANEOUS) ×3 IMPLANT
PAD CLEANER CAUTERY TIP 5X5 (MISCELLANEOUS) ×2
PAD PREP 24X41 OB/GYN DISP (PERSONAL CARE ITEMS) ×3 IMPLANT
PENCIL ELECTRO HAND CTR (MISCELLANEOUS) ×6 IMPLANT
PROT DEXTUS HAND ACCESS (MISCELLANEOUS)
RELOAD PROXIMATE 75MM BLUE (ENDOMECHANICALS) ×3 IMPLANT
RETRACTOR FIXED LENGTH SML (MISCELLANEOUS) IMPLANT
RETRACTOR WOUND ALXS 18CM MED (MISCELLANEOUS) ×1 IMPLANT
RTRCTR WOUND ALEXIS O 18CM MED (MISCELLANEOUS) ×3
SCISSORS METZENBAUM CVD 33 (INSTRUMENTS) ×3 IMPLANT
SET YANKAUER POOLE SUCT (MISCELLANEOUS) ×3 IMPLANT
SHEARS HARMONIC ACE PLUS 36CM (ENDOMECHANICALS) ×3 IMPLANT
SLEEVE ENDOPATH XCEL 5M (ENDOMECHANICALS) ×3 IMPLANT
SPONGE LAP 18X18 5 PK (GAUZE/BANDAGES/DRESSINGS) ×3 IMPLANT
STAPLER PROXIMATE 75MM BLUE (STAPLE) ×3 IMPLANT
SUT MNCRL AB 3-0 PS2 27 (SUTURE) ×3 IMPLANT
SUT PDS AB 0 CT1 27 (SUTURE) ×6 IMPLANT
SUT PROLENE 0 CT 1 30 (SUTURE) ×12 IMPLANT
SUT SILK 2 0 (SUTURE) ×2
SUT SILK 2-0 18XBRD TIE 12 (SUTURE) ×1 IMPLANT
SUT SILK 3-0 (SUTURE) ×3 IMPLANT
SUT VIC AB 0 CT1 36 (SUTURE) ×3 IMPLANT
SUT VIC AB 2-0 BRD 54 (SUTURE) ×3 IMPLANT
SUT VIC AB 2-0 CT1 27 (SUTURE) ×4
SUT VIC AB 2-0 CT1 TAPERPNT 27 (SUTURE) ×2 IMPLANT
SUT VIC AB 3-0 54X BRD REEL (SUTURE) ×2 IMPLANT
SUT VIC AB 3-0 BRD 54 (SUTURE) ×4
SUT VIC AB 3-0 SH 27 (SUTURE) ×8
SUT VIC AB 3-0 SH 27X BRD (SUTURE) ×4 IMPLANT
SUT VIC AB 4-0 FS2 27 (SUTURE) ×6 IMPLANT
SWABSTK COMLB BENZOIN TINCTURE (MISCELLANEOUS) ×3 IMPLANT
SYR 30ML LL (SYRINGE) ×3 IMPLANT
SYR BULB IRRIG 60ML STRL (SYRINGE) ×3 IMPLANT
TROCAR XCEL NON-BLD 11X100MML (ENDOMECHANICALS) ×3 IMPLANT
TROCAR XCEL NON-BLD 5MMX100MML (ENDOMECHANICALS) ×3 IMPLANT
TROCAR XCEL UNIV SLVE 11M 100M (ENDOMECHANICALS) ×6 IMPLANT
TUBING INSUFFLATOR HEATED (MISCELLANEOUS) ×3 IMPLANT
WATER STERILE IRR 1000ML POUR (IV SOLUTION) ×3 IMPLANT

## 2016-01-30 NOTE — Anesthesia Procedure Notes (Signed)
Procedure Name: Intubation Performed by: Livia Tarr Pre-anesthesia Checklist: Patient identified, Emergency Drugs available, Timeout performed, Patient being monitored and Suction available Patient Re-evaluated:Patient Re-evaluated prior to inductionOxygen Delivery Method: Circle system utilized Preoxygenation: Pre-oxygenation with 100% oxygen Intubation Type: IV induction Laryngoscope Size: Mac and 3 Grade View: Grade I Tube type: Oral Number of attempts: 1 Secured at: 21 cm Tube secured with: Tape

## 2016-01-30 NOTE — Transfer of Care (Signed)
Immediate Anesthesia Transfer of Care Note  Patient: [name]  Procedure(s) Performed: Procedure(s): LAPAROSCOPIC SIGMOID COLECTOMY (N/A)  Patient Location: PACU  Anesthesia Type:General  Level of Consciousness: awake and alert   Airway & Oxygen Therapy: Patient Spontanous Breathing and Patient connected to face mask oxygen  Post-op Assessment: Report given to RN  Post vital signs: Reviewed and stable  Last Vitals:  Vitals:   01/30/16 1421 01/30/16 1422  BP: (!) 158/72 (!) 152/78  Pulse: 88 88  Resp: 17 17  Temp: 36.3 C 36.8 C    Last Pain:  Vitals:   01/30/16 1130  TempSrc: Oral         Complications: No apparent anesthesia complications

## 2016-01-30 NOTE — H&P (View-Only) (Signed)
Patient ID: [name], female   DOB: Sep 15, 1945, 70 y.o.   MRN: [id number]  Chief Complaint  Patient presents with  . Mass    colon    HPI [name] is a 70 y.o. female here today for a evaluation of a colon mass.Patient had a colonoscopy done on 01/09/16. Path showed tubular adenoma. This was not amenable to removal via colonoscope. Denies any gastrointestinal issues. Bowels move daily, no bleeding. She had a sister with colon cancer. Her primary physician found blood in her stool at an office visit back in July. She is here today with her sister in law, [name]. I have reviewed the history of present illness with the patient.  HPI  Past Medical History:  Diagnosis Date  . Anemia   . Diabetes mellitus without complication ([place])   . Hypertension     Past Surgical History:  Procedure Laterality Date  . COLONOSCOPY WITH PROPOFOL N/A 01/09/2016   Procedure: COLONOSCOPY WITH PROPOFOL;  Surgeon: Lollie Sails, MD;  Location: [facility] ENDOSCOPY;  Service: Endoscopy;  Laterality: N/A;    Family History  Problem Relation Age of Onset  . Breast cancer Sister   . Colon cancer Sister     Social History Social History  Substance Use Topics  . Smoking status: Never Smoker  . Smokeless tobacco: Never Used  . Alcohol use No    No Known Allergies  Current Outpatient Prescriptions  Medication Sig Dispense Refill  . amLODipine (NORVASC) 5 MG tablet Take 5 mg by mouth daily.    . ferrous sulfate 325 (65 FE) MG tablet Take 325 mg by mouth daily with breakfast.    . hydrochlorothiazide (HYDRODIURIL) 25 MG tablet Take 25 mg by mouth daily.    Marland Kitchen lisinopril (PRINIVIL,ZESTRIL) 40 MG tablet Take 40 mg by mouth daily.    . metFORMIN (GLUCOPHAGE) 500 MG tablet Take by mouth 2 (two) times daily with a meal.     No current facility-administered medications for this visit.     Review of Systems Review of Systems  Constitutional: Negative.   Respiratory: Negative.    Cardiovascular: Negative.     Blood pressure (!) 162/92, pulse 78, resp. rate 14, height 5\' 3"  (1.6 m), weight 127 lb (57.6 kg).  Physical Exam Physical Exam  Constitutional: She is oriented to person, place, and time. She appears well-developed and well-nourished.  HENT:  Mouth/Throat: Oropharynx is clear and moist.  Eyes: Conjunctivae are normal. No scleral icterus.  Neck: Neck supple.  Cardiovascular: Normal rate, regular rhythm and normal heart sounds.   Pulmonary/Chest: Effort normal and breath sounds normal.  Abdominal: Soft. Normal appearance and bowel sounds are normal. There is no hepatosplenomegaly. There is no tenderness.  Lymphadenopathy:    She has no cervical adenopathy.  Neurological: She is alert and oriented to person, place, and time.  Skin: Skin is warm and dry.  Psychiatric: Her behavior is normal.    Data Reviewed Colonoscopy and progress notes.  Assessment    Sigmoid colon polyp-biopsy showed tubular adenoma. Polyp appears too big for removal bo colonoscope. Given FH of colon cancer surgical resection is reasonable.     Plan    Discussed in detail risk and benefits of colon resection to completely remove colon polyp, patient agrees. Procedure explained to her.    The patient is scheduled for surgery at [facility] on 01/30/16. She will pre admit by phone. The patient is aware of date and instructions.  This information has been scribed by Rosann Auerbach  Hatch RN, BSN,BC.   SANKAR,SEEPLAPUTHUR G 01/22/2016, 10:12 AM

## 2016-01-30 NOTE — Interval H&P Note (Signed)
History and Physical Interval Note:  01/30/2016 11:12 AM  [name]  has presented today for surgery, with the diagnosis of SIGMOID COLON OLYP  The various methods of treatment have been discussed with the patient and family. After consideration of risks, benefits and other options for treatment, the patient has consented to  Procedure(s): LAPAROSCOPIC SIGMOID COLECTOMY (N/A) as a surgical intervention .  The patient's history has been reviewed, patient examined, no change in status, stable for surgery.  I have reviewed the patient's chart and labs.  Questions were answered to the patient's satisfaction.     SANKAR,SEEPLAPUTHUR G

## 2016-01-30 NOTE — OR Nursing (Signed)
Dr. Jamal Collin notified that MRSA screen was sent, no new orders

## 2016-01-30 NOTE — Op Note (Signed)
Preop diagnosis: Polyp of the sigmoid colon  Post op diagnosis: Same  Operation: Laparoscopy sigmoid colon resection  Surgeon: Mckinley Jewel  Assistant: Ollen Bowl   Anesthesia: Gen.  Complications: None  EBL: 50 mL  Drains: None  Description: Patient was put to sleep in supine position the operating table. Foley catheter was inserted. Her legs were placed in adjustable stirrups. The abdomen and the rectal areas were prepped and draped as sterile field using Betadine for rectal area and ChloraPrep for the abdominal area. Timeout was then performed. Initial port side was set up planned over the epigastric region where a small transverse incision was made and a Veress needle position the peritoneal cavity verified of the hanging drop method. 10 mm port was placed after instillation of CO2 and the camera introduced with good visualization the peritoneal cavity. Right lower and right mid port side was planned 11 mm in the lower aspect and 5 mm in the mid right side. The sigmoid colon was noted be fairly mobile. There are couplet tight adhesions on the lateral aspect the sigmoid to the lateral peritoneum and these were taken down with the harmonic device. Sigmoid colon and the distal descending colon was then mobilized by taking down the peritoneal reflection on that side with the use of the harmonic scalpel. The ovary was identified and appeared to be free of any pathology. The inked area was between the midportion and the distal portion of the sigmoid colon. After the sigmoid was adequately mobilized was noted to be easily movable all the way to the right side. This point the laparoscopy portion was concluded and the ports removed and infraumbilical vertical midline incision was made approximately 5 cm and deepened through the layers of abdominal wall and bleeding controlled cautery. A wound protector was placed in the sigmoid colon was brought up and visualized effectively the inked area was  noted contain the palpable 2 cm polyp. Leaving an adequate margin it appeared that the the very distal sigmoid colon near the rectosigmoid junction with twice a day he'll follow distal side of transection. In the proximal colon the midportion was selected for transection the mesentery was then scored and then taken down with the use of the harmonic device except for the main the vessel supplying the sigmoid which was doubly ligated with 2-0 silk and cut. Given the free mobility was decided to was side-to-side anastomosis of the proximal and distal bowel these were then brought together and held in place with some silk stitches. Opening was made on both the size of the bowel and it GIA stapler was used to perform the side-to-side anastomosis. A second application of the GIA was used to transect the bowel and thereby closed the remaining opening. The anastomotic corners were then reinforced with 3-0 silk. The mesentery was closed with a running 3-0 Vicryl.. Abdomen irrigated with this but 200 mL of saline and after ensuring proper hemostasis closure obtained. The fascia and peritoneum closed in single layer with interrupted figure-of-eight stitches of 0 PDS. Wound was irrigated and subcutaneous tissue closed with 2-0 Vicryl. The skin and the port site skin incision was then closed with subcuticular 3-0 Monocryl covered with Dermabond. Patient  tolerated the procedure well with no immediate problems encountered. She subsequently was extubated and returned recovery room stable condition

## 2016-01-30 NOTE — Anesthesia Preprocedure Evaluation (Signed)
Anesthesia Evaluation  Patient identified by MRN, date of birth, ID band Patient awake    Reviewed: Allergy & Precautions, NPO status , Patient's Chart, lab work & pertinent test results  Airway Mallampati: II       Dental  (+) Upper Dentures, Lower Dentures   Pulmonary neg pulmonary ROS,    breath sounds clear to auscultation       Cardiovascular Exercise Tolerance: Good hypertension, Pt. on medications  Rhythm:Regular     Neuro/Psych negative neurological ROS     GI/Hepatic negative GI ROS, Neg liver ROS,   Endo/Other  diabetes, Type 2, Oral Hypoglycemic Agents  Renal/GU negative Renal ROS     Musculoskeletal   Abdominal Normal abdominal exam  (+)   Peds  Hematology  (+) anemia ,   Anesthesia Other Findings   Reproductive/Obstetrics                             Anesthesia Physical Anesthesia Plan  ASA: II  Anesthesia Plan: General   Post-op Pain Management:    Induction: Intravenous  Airway Management Planned: Oral ETT  Additional Equipment:   Intra-op Plan:   Post-operative Plan: Extubation in OR  Informed Consent: I have reviewed the patients History and Physical, chart, labs and discussed the procedure including the risks, benefits and alternatives for the proposed anesthesia with the patient or authorized representative who has indicated his/her understanding and acceptance.     Plan Discussed with: CRNA  Anesthesia Plan Comments:         Anesthesia Quick Evaluation

## 2016-01-31 ENCOUNTER — Encounter: Payer: Self-pay | Admitting: General Surgery

## 2016-01-31 LAB — BASIC METABOLIC PANEL
ANION GAP: 7 (ref 5–15)
BUN: 16 mg/dL (ref 6–20)
CHLORIDE: 105 mmol/L (ref 101–111)
CO2: 23 mmol/L (ref 22–32)
Calcium: 8.5 mg/dL — ABNORMAL LOW (ref 8.9–10.3)
Creatinine, Ser: 0.85 mg/dL (ref 0.44–1.00)
GFR calc Af Amer: >60 mL/min (ref >60)
GLUCOSE: 112 mg/dL — AB (ref 65–99)
POTASSIUM: 3.9 mmol/L (ref 3.5–5.1)
SODIUM: 135 mmol/L (ref 135–145)

## 2016-01-31 LAB — CBC
HEMATOCRIT: 27.5 % — AB (ref 35.0–47.0)
HEMOGLOBIN: 9.5 g/dL — AB (ref 12.0–16.0)
MCH: 27.1 pg (ref 26.0–34.0)
MCHC: 34.6 g/dL (ref 32.0–36.0)
MCV: 78.2 fL — AB (ref 80.0–100.0)
Platelets: 195 10*3/uL (ref 150–440)
RBC: 3.52 MIL/uL — ABNORMAL LOW (ref 3.80–5.20)
RDW: 12.6 % (ref 11.5–14.5)
WBC: 14.2 10*3/uL — AB (ref 3.6–11.0)

## 2016-01-31 NOTE — Progress Notes (Signed)
Patient ID: [name], female   DOB: 16-Oct-1945, 70 y.o.   MRN: [id number] Patient with no complaints. Pain is minimal. No nausea or vomiting. AVSS. Excellent urine output. Abdomen is soft and flat with good bowel sounds. Incision and port sites are clean. Lungs clear   Labs-hemoglobin stable at 9.5, WBC is mildly elevated 14 K, chemistries are normal. Stable course post sigmoid colon resection. Advised patient to get out of bed and ambulate today and discussed this with nursing also. Started on clear liquid diet.

## 2016-01-31 NOTE — Progress Notes (Signed)
Initial Nutrition Assessment  DOCUMENTATION CODES:   Not applicable  INTERVENTION:  -Recommend addition of Ensure Enlive po BID once diet advanced, each supplement provides 350 kcal and 20 grams of protein -Cater to pt preferences  NUTRITION DIAGNOSIS:   Inadequate oral intake related to acute illness as evidenced by per patient/family report (CL diet).  GOAL:   Patient will meet greater than or equal to 90% of their needs  MONITOR:   Diet advancement, PO intake, Labs, Weight trends  REASON FOR ASSESSMENT:   Malnutrition Screening Tool    ASSESSMENT:    70 yo female admitted with colon polyp s/p sigmoid colon resection on 8/30  Tolerating CL diet without signs of GI intolerance.   Pt reports fairly good appetite prior to admission, eating 3 meals per day and drinking Ensure 1-2 x per day. Pt reports 10 pound wt loss in past couple of months (7.1% wt loss)   Nutrition-Focused physical exam completed. Findings are WDL for fat depletion, muscle depletion, and edema.    Past Medical History:  Diagnosis Date  . Anemia   . Arthritis    hands and knees  . Diabetes mellitus without complication ([place])   . Hypertension     Diet Order:  Diet bariatric clear liquid Room service appropriate? Yes; Fluid consistency: Thin  Skin:  Reviewed, no issues  Last BM:  8/30   Labs: reviewed  Meds: 1/2 NS at 100 ml/hr  Height:   Ht Readings from Last 1 Encounters:  01/30/16 5\' 3"  (1.6 m)    Weight:   Wt Readings from Last 1 Encounters:  01/30/16 130 lb 1.6 oz (59 kg)    BMI:  Body mass index is 23.05 kg/m.  Estimated Nutritional Needs:   Kcal:  1770-2065 kcals   Protein:  88-100 g  Fluid:  >/= 1.8 L  EDUCATION NEEDS:   No education needs identified at this time  [place], [place], [place] [phone] Pager  [phone] Weekend/On-Call Pager

## 2016-02-01 DIAGNOSIS — I10 Essential (primary) hypertension: Secondary | ICD-10-CM | POA: Diagnosis not present

## 2016-02-01 DIAGNOSIS — D125 Benign neoplasm of sigmoid colon: Secondary | ICD-10-CM | POA: Diagnosis not present

## 2016-02-01 DIAGNOSIS — E119 Type 2 diabetes mellitus without complications: Secondary | ICD-10-CM | POA: Diagnosis not present

## 2016-02-01 DIAGNOSIS — Z9889 Other specified postprocedural states: Secondary | ICD-10-CM | POA: Diagnosis not present

## 2016-02-01 DIAGNOSIS — Z7984 Long term (current) use of oral hypoglycemic drugs: Secondary | ICD-10-CM | POA: Diagnosis not present

## 2016-02-01 DIAGNOSIS — Z853 Personal history of malignant neoplasm of breast: Secondary | ICD-10-CM | POA: Diagnosis not present

## 2016-02-01 DIAGNOSIS — Z79899 Other long term (current) drug therapy: Secondary | ICD-10-CM | POA: Diagnosis not present

## 2016-02-01 DIAGNOSIS — Z800 Family history of malignant neoplasm of digestive organs: Secondary | ICD-10-CM | POA: Diagnosis not present

## 2016-02-01 DIAGNOSIS — K660 Peritoneal adhesions (postprocedural) (postinfection): Secondary | ICD-10-CM | POA: Diagnosis not present

## 2016-02-01 LAB — CBC WITH DIFFERENTIAL/PLATELET
Basophils Absolute: 0 10*3/uL (ref 0–0.1)
Basophils Relative: 0 %
Eosinophils Absolute: 0.1 10*3/uL (ref 0–0.7)
Eosinophils Relative: 1 %
HEMATOCRIT: 26.6 % — AB (ref 35.0–47.0)
HEMOGLOBIN: 9.4 g/dL — AB (ref 12.0–16.0)
LYMPHS ABS: 3.8 10*3/uL — AB (ref 1.0–3.6)
Lymphocytes Relative: 27 %
MCH: 27.8 pg (ref 26.0–34.0)
MCHC: 35.2 g/dL (ref 32.0–36.0)
MCV: 78.8 fL — AB (ref 80.0–100.0)
MONO ABS: 0.7 10*3/uL (ref 0.2–0.9)
MONOS PCT: 5 %
NEUTROS ABS: 9.5 10*3/uL — AB (ref 1.4–6.5)
NEUTROS PCT: 67 %
Platelets: 181 10*3/uL (ref 150–440)
RBC: 3.37 MIL/uL — ABNORMAL LOW (ref 3.80–5.20)
RDW: 12.6 % (ref 11.5–14.5)
WBC: 14.1 10*3/uL — ABNORMAL HIGH (ref 3.6–11.0)

## 2016-02-01 LAB — POTASSIUM: Potassium: 3.4 mmol/L — ABNORMAL LOW (ref 3.5–5.1)

## 2016-02-01 LAB — SURGICAL PATHOLOGY

## 2016-02-01 NOTE — Care Management Important Message (Signed)
Important Message  Patient Details  Name: [name] MRN: [id number] Date of Birth: 1945-06-18   Medicare Important Message Given:  Yes    Beverly Sessions, RN 02/01/2016, 12:52 PM

## 2016-02-01 NOTE — Progress Notes (Signed)
Patient ID: [name], female   DOB: 1945-06-22, 70 y.o.   MRN: [id number] No complaints. Had a good BM this am. AVSS.  Abdomen soft, flat, active bowel sounds. Incision clean and intact. Lungs clear. Labs stable. Good u/o. Doing very well. Advance diet. Anticipate discharge in 1-2 days.

## 2016-02-02 MED ORDER — OXYCODONE HCL 5 MG PO TABS: 5.0000 mg | ORAL_TABLET | ORAL | 0 refills | Status: DC | PRN

## 2016-02-02 NOTE — Discharge Summary (Signed)
Physician Discharge Summary  Patient ID: [name] MRN: [id number] DOB/AGE: 10-22-1945 70 y.o.  Admit date: 01/30/2016 Discharge date: 02/02/2016  Admission Diagnoses:Polyp of sigmoid colon  Discharge Diagnoses: Tubulovillous adenoma sigmoid colon Active Problems:   Benign neoplasm of sigmoid colon   Discharged Condition: good  Hospital Course: This 70 year old female was found on colonoscopy to have a polyp in the distal sigmoid colon that was difficult to remove the other colonoscope. Few other adenomatous polyps removed at the same time. Surgical resection was planned. On 01/30/2016 the patient underwent a laparoscopy and sigmoid colon resection. Procedure was uneventful. She had a very stable postoperative course. She was started on clear liquid diet on day 1 and advanced to solid food on day 2. Her bowels are functioning. Pain is minimal. Incision is clean and intact as also the port site.  Consults: None  Significant Diagnostic Studies: Pathology  Treatments: surgery: Laparoscopy and sigmoid colon resection  Discharge Exam: Blood pressure 138/60, pulse 86, temperature 98.6 F (37 C), temperature source Oral, resp. rate 20, height 5\' 3"  (1.6 m), weight 130 lb 1.6 oz (59 kg), SpO2 100 %. Incision/Wound: Intact and clean wound. She is afebrile with stable vital signs. Abdomen is soft with good bowel sounds  Disposition: 01-Home or Self Care  Discharge Instructions    Call MD for:  persistant nausea and vomiting    Complete by:  As directed   Call MD for:  redness, tenderness, or signs of infection (pain, swelling, redness, odor or green/yellow discharge around incision site)    Complete by:  As directed   Call MD for:  severe uncontrolled pain    Complete by:  As directed   Diet - low sodium heart healthy    Complete by:  As directed   Discharge instructions    Complete by:  As directed   May shower. No exertional activity. No driving for 1 more week.       Medication  List    TAKE these medications   amLODipine 5 MG tablet Commonly known as:  NORVASC Take 5 mg by mouth every morning.   ENSURE Take 237 mLs by mouth daily.   ferrous sulfate 325 (65 FE) MG tablet Take 325 mg by mouth daily with breakfast.   hydrochlorothiazide 25 MG tablet Commonly known as:  HYDRODIURIL Take 25 mg by mouth daily.   lisinopril 40 MG tablet Commonly known as:  PRINIVIL,ZESTRIL Take 40 mg by mouth at bedtime.   metFORMIN 500 MG tablet Commonly known as:  GLUCOPHAGE Take by mouth 2 (two) times daily with a meal.   oxyCODONE 5 MG immediate release tablet Commonly known as:  Oxy IR/ROXICODONE Take 1-2 tablets (5-10 mg total) by mouth every 4 (four) hours as needed for moderate pain.        SignedChristene Lye 02/02/2016, 8:45 AM

## 2016-02-05 ENCOUNTER — Telehealth: Payer: Self-pay | Admitting: General Surgery

## 2016-02-05 NOTE — Telephone Encounter (Signed)
PT CALLED IN TO MAKE AN APPOINTMENT FROM BEING IN THE HOSPITAL.HAD LAP SIG COLECTOMY SX ON 01-30-16.SHE STATES DR Jamal Collin TOLD HER TO CALL HERE ON Tuesday & MAKE AN APPOINTMENT. CAOYL-LYN LOOKED IN THE NOTES & WAS UNABLE TO TELL WHEN SHE NEEDED TO RETURN FOR HER P/O. SHE VERBALLY STATED TO MAKE IT FOR NEXT WEEK. WHEN DOES PT NEED TO RETURN FOR HER POST OP?? CURRENT APPT SCHEDULED FOR 02-12-16 @ 1:30PM

## 2016-02-06 NOTE — Telephone Encounter (Signed)
PER CARYL-LYN,DR SANKAR STATES PTS P/O APPT  SCHEDULED FOR 02-12-16 @ 1:30 WAS FINE/MTH

## 2016-02-10 NOTE — Anesthesia Postprocedure Evaluation (Signed)
Anesthesia Post Note  Patient: [name]  Procedure(s) Performed: Procedure(s) (LRB): LAPAROSCOPIC SIGMOID COLECTOMY (N/A)  Patient location during evaluation: PACU Anesthesia Type: General Level of consciousness: awake Pain management: satisfactory to patient Vital Signs Assessment: post-procedure vital signs reviewed and stable Respiratory status: nonlabored ventilation Cardiovascular status: stable Anesthetic complications: no    Last Vitals:  Vitals:   02/01/16 2028 02/02/16 0455  BP: 125/69 138/60  Pulse: 94 86  Resp: 20 20  Temp: 37.5 C 37 C    Last Pain:  Vitals:   02/02/16 0455  TempSrc: Oral  PainSc:                  VAN STAVEREN,Aritzel Krusemark

## 2016-02-12 ENCOUNTER — Ambulatory Visit (INDEPENDENT_AMBULATORY_CARE_PROVIDER_SITE_OTHER): Payer: Commercial Managed Care - HMO | Admitting: General Surgery

## 2016-02-12 ENCOUNTER — Encounter: Payer: Self-pay | Admitting: General Surgery

## 2016-02-12 VITALS — BP 120/68 | HR 72 | Resp 14 | Ht 63.0 in | Wt 125.0 lb

## 2016-02-12 DIAGNOSIS — D125 Benign neoplasm of sigmoid colon: Secondary | ICD-10-CM

## 2016-02-12 NOTE — Progress Notes (Signed)
Patient ID: [name], female   DOB: 28-Feb-1946, 70 y.o.   MRN: [id number]  Chief Complaint  Patient presents with  . Routine Post Op    HPI [name] is a 70 y.o. female here today for her post-op colectomy done on 01/30/16. Patient states she is doing well and has no complaints.   I have reviewed the history of present illness with the patient.  HPI  Past Medical History:  Diagnosis Date  . Anemia   . Arthritis    hands and knees  . Diabetes mellitus without complication ([place])   . Hypertension     Past Surgical History:  Procedure Laterality Date  . COLONOSCOPY WITH PROPOFOL N/A 01/09/2016   Procedure: COLONOSCOPY WITH PROPOFOL;  Surgeon: Lollie Sails, MD;  Location: [facility] ENDOSCOPY;  Service: Endoscopy;  Laterality: N/A;  . LAPAROSCOPIC SIGMOID COLECTOMY N/A 01/30/2016   Procedure: LAPAROSCOPIC SIGMOID COLECTOMY;  Surgeon: Christene Lye, MD;  Location: [facility] ORS;  Service: General;  Laterality: N/A;    Family History  Problem Relation Age of Onset  . Breast cancer Sister   . Colon cancer Sister     Social History Social History  Substance Use Topics  . Smoking status: Never Smoker  . Smokeless tobacco: Never Used  . Alcohol use No    No Known Allergies  Current Outpatient Prescriptions  Medication Sig Dispense Refill  . amLODipine (NORVASC) 5 MG tablet Take 5 mg by mouth every morning.     Marland Kitchen ENSURE (ENSURE) Take 237 mLs by mouth daily.    . ferrous sulfate 325 (65 FE) MG tablet Take 325 mg by mouth daily with breakfast.    . hydrochlorothiazide (HYDRODIURIL) 25 MG tablet Take 25 mg by mouth daily.    Marland Kitchen lisinopril (PRINIVIL,ZESTRIL) 40 MG tablet Take 40 mg by mouth at bedtime.     . metFORMIN (GLUCOPHAGE) 500 MG tablet Take by mouth 2 (two) times daily with a meal.    . oxyCODONE (OXY IR/ROXICODONE) 5 MG immediate release tablet Take 1-2 tablets (5-10 mg total) by mouth every 4 (four) hours as needed for moderate pain. 30 tablet 0   No current  facility-administered medications for this visit.     Review of Systems Review of Systems  Constitutional: Negative.   Respiratory: Negative.   Cardiovascular: Negative.     Blood pressure 120/68, pulse 72, resp. rate 14, height 5\' 3"  (1.6 m), weight 125 lb (56.7 kg).  Physical Exam Physical Exam  Constitutional: She is oriented to person, place, and time. She appears well-developed and well-nourished.  Cardiovascular: Normal rate, regular rhythm and normal heart sounds.   Pulmonary/Chest: Effort normal and breath sounds normal.  Abdominal: Soft. Normal appearance and bowel sounds are normal. There is no tenderness.    Neurological: She is alert and oriented to person, place, and time.  Skin: Skin is warm and dry.    Data Reviewed   Path- benign adenoma of sigmoid colon  Assessment    Stable exam. Port sites and incision are clean, dry, and intact without evidence of infection.    Plan    Patient to follow up in one month. Repeat colonoscopy in 1 year.    This information has been scribed by Gaspar Cola CMA.    SANKAR,SEEPLAPUTHUR G 02/12/2016, 2:43 PM

## 2016-02-12 NOTE — Patient Instructions (Signed)
Return in one month.  

## 2016-03-05 ENCOUNTER — Other Ambulatory Visit: Payer: Self-pay | Admitting: Internal Medicine

## 2016-03-05 DIAGNOSIS — N6489 Other specified disorders of breast: Secondary | ICD-10-CM

## 2016-03-11 DIAGNOSIS — D126 Benign neoplasm of colon, unspecified: Secondary | ICD-10-CM | POA: Diagnosis not present

## 2016-03-11 DIAGNOSIS — R627 Adult failure to thrive: Secondary | ICD-10-CM | POA: Diagnosis not present

## 2016-03-12 ENCOUNTER — Ambulatory Visit (INDEPENDENT_AMBULATORY_CARE_PROVIDER_SITE_OTHER): Payer: Commercial Managed Care - HMO | Admitting: General Surgery

## 2016-03-12 ENCOUNTER — Encounter: Payer: Self-pay | Admitting: General Surgery

## 2016-03-12 VITALS — BP 132/70 | HR 70 | Resp 12 | Ht 63.0 in | Wt 136.0 lb

## 2016-03-12 DIAGNOSIS — K635 Polyp of colon: Secondary | ICD-10-CM

## 2016-03-12 DIAGNOSIS — D125 Benign neoplasm of sigmoid colon: Secondary | ICD-10-CM

## 2016-03-12 NOTE — Progress Notes (Signed)
Patient ID: [name], female   DOB: 1945/09/23, 70 y.o.   MRN: [id number]  Chief Complaint  Patient presents with  . Routine Post Op    Colectomy    HPI [name] is a 70 y.o. female here today for a post op colectomy done on 01/30/16 for a colon polyp. She states she is doing well. No problems moving her bowels.   I have reviewed the history of present illness with the patient.  HPI  Past Medical History:  Diagnosis Date  . Anemia   . Arthritis    hands and knees  . Colon polyp 01/30/2016  . Diabetes mellitus without complication ([place])   . Hypertension     Past Surgical History:  Procedure Laterality Date  . COLONOSCOPY WITH PROPOFOL N/A 01/09/2016   Procedure: COLONOSCOPY WITH PROPOFOL;  Surgeon: Lollie Sails, MD;  Location: [facility] ENDOSCOPY;  Service: Endoscopy;  Laterality: N/A;  . LAPAROSCOPIC SIGMOID COLECTOMY N/A 01/30/2016   Procedure: LAPAROSCOPIC SIGMOID COLECTOMY;  Surgeon: Christene Lye, MD;  Location: [facility] ORS;  Service: General;  Laterality: N/A;    Family History  Problem Relation Age of Onset  . Breast cancer Sister   . Colon cancer Sister     Social History Social History  Substance Use Topics  . Smoking status: Never Smoker  . Smokeless tobacco: Never Used  . Alcohol use No    No Known Allergies  Current Outpatient Prescriptions  Medication Sig Dispense Refill  . amLODipine (NORVASC) 5 MG tablet Take 5 mg by mouth every morning.     Marland Kitchen ENSURE (ENSURE) Take 237 mLs by mouth daily.    . ferrous sulfate 325 (65 FE) MG tablet Take 325 mg by mouth daily with breakfast.    . hydrochlorothiazide (HYDRODIURIL) 25 MG tablet Take 25 mg by mouth daily.    Marland Kitchen lisinopril (PRINIVIL,ZESTRIL) 40 MG tablet Take 40 mg by mouth at bedtime.     . metFORMIN (GLUCOPHAGE) 500 MG tablet Take by mouth 2 (two) times daily with a meal.    . oxyCODONE (OXY IR/ROXICODONE) 5 MG immediate release tablet Take 1-2 tablets (5-10 mg total) by mouth every 4 (four)  hours as needed for moderate pain. 30 tablet 0   No current facility-administered medications for this visit.     Review of Systems Review of Systems  Constitutional: Negative.   Respiratory: Negative.   Cardiovascular: Negative.   Gastrointestinal: Negative.     Blood pressure 132/70, pulse 70, resp. rate 12, height 5\' 3"  (1.6 m), weight 136 lb (61.7 kg).  Physical Exam Physical Exam  Constitutional: She is oriented to person, place, and time. She appears well-developed and well-nourished.  Eyes: Conjunctivae are normal. No scleral icterus.  Neck: Neck supple.  Cardiovascular: Normal rate and regular rhythm.   Pulmonary/Chest: Effort normal and breath sounds normal.  Abdominal: Soft. Bowel sounds are normal. She exhibits no distension. There is no tenderness.  Abdominal incision and port sites are well healed  Neurological: She is alert and oriented to person, place, and time.  Skin: Skin is warm and dry.    Data Reviewed Prior notes  Assessment    Stable post-op exam    Plan    Return to full activity. Follow up in 4 months. Call sooner for any concerns.       Zaki Gertsch G 03/12/2016, 3:02 PM

## 2016-03-12 NOTE — Patient Instructions (Signed)
Return to full activity. Follow up in 4 months.

## 2016-04-04 ENCOUNTER — Ambulatory Visit
Admission: RE | Admit: 2016-04-04 | Discharge: 2016-04-04 | Disposition: A | Discharge status: Home / Self Care | Payer: Commercial Managed Care - HMO | Source: Ambulatory Visit | Attending: Internal Medicine | Admitting: Internal Medicine

## 2016-04-04 DIAGNOSIS — N6489 Other specified disorders of breast: Secondary | ICD-10-CM | POA: Diagnosis not present

## 2016-04-04 DIAGNOSIS — R928 Other abnormal and inconclusive findings on diagnostic imaging of breast: Secondary | ICD-10-CM | POA: Diagnosis not present

## 2016-06-06 ENCOUNTER — Other Ambulatory Visit: Payer: Self-pay | Admitting: Internal Medicine

## 2016-06-06 DIAGNOSIS — Z1231 Encounter for screening mammogram for malignant neoplasm of breast: Secondary | ICD-10-CM

## 2016-06-10 DIAGNOSIS — Z23 Encounter for immunization: Secondary | ICD-10-CM | POA: Diagnosis not present

## 2016-07-07 ENCOUNTER — Encounter: Payer: Self-pay | Admitting: General Surgery

## 2016-07-07 ENCOUNTER — Ambulatory Visit (INDEPENDENT_AMBULATORY_CARE_PROVIDER_SITE_OTHER): Payer: Medicare HMO | Admitting: General Surgery

## 2016-07-07 VITALS — BP 130/64 | HR 96 | Resp 14 | Ht 63.0 in | Wt 137.0 lb

## 2016-07-07 DIAGNOSIS — D125 Benign neoplasm of sigmoid colon: Secondary | ICD-10-CM | POA: Diagnosis not present

## 2016-07-07 DIAGNOSIS — Z803 Family history of malignant neoplasm of breast: Secondary | ICD-10-CM

## 2016-07-07 DIAGNOSIS — D638 Anemia in other chronic diseases classified elsewhere: Secondary | ICD-10-CM

## 2016-07-07 DIAGNOSIS — K635 Polyp of colon: Secondary | ICD-10-CM

## 2016-07-07 DIAGNOSIS — Z800 Family history of malignant neoplasm of digestive organs: Secondary | ICD-10-CM

## 2016-07-07 NOTE — Progress Notes (Signed)
Patient ID: [name], female   DOB: 05-18-1946, 71 y.o.   MRN: [id number]  Chief Complaint  Patient presents with  . Follow-up    HPI [name] is a 71 y.o. female here today for her follow up colectomy done on 01/30/16 for a colon polyp. She states she is doing well. Reports that her bowels are moving regularly. I have reviewed the history of present illness with the patient.  HPI  Past Medical History:  Diagnosis Date  . Anemia   . Arthritis    hands and knees  . Colon polyp 01/30/2016  . Diabetes mellitus without complication ([place])   . Hypertension     Past Surgical History:  Procedure Laterality Date  . COLONOSCOPY WITH PROPOFOL N/A 01/09/2016   Procedure: COLONOSCOPY WITH PROPOFOL;  Surgeon: Lollie Sails, MD;  Location: [facility] ENDOSCOPY;  Service: Endoscopy;  Laterality: N/A;  . LAPAROSCOPIC SIGMOID COLECTOMY N/A 01/30/2016   Tubulovillous adenoma    Family History  Problem Relation Age of Onset  . Breast cancer Sister   . Colon cancer Sister     Social History Social History  Substance Use Topics  . Smoking status: Never Smoker  . Smokeless tobacco: Never Used  . Alcohol use No    No Known Allergies  Current Outpatient Prescriptions  Medication Sig Dispense Refill  . amLODipine (NORVASC) 5 MG tablet Take 5 mg by mouth every morning.     Marland Kitchen ENSURE (ENSURE) Take 237 mLs by mouth daily.    . ferrous sulfate 325 (65 FE) MG tablet Take 325 mg by mouth daily with breakfast.    . hydrochlorothiazide (HYDRODIURIL) 25 MG tablet Take 25 mg by mouth daily.    Marland Kitchen lisinopril (PRINIVIL,ZESTRIL) 40 MG tablet Take 40 mg by mouth at bedtime.     . metFORMIN (GLUCOPHAGE) 500 MG tablet Take by mouth 2 (two) times daily with a meal.     No current facility-administered medications for this visit.     Review of Systems Review of Systems  Constitutional: Negative.   Respiratory: Negative.   Cardiovascular: Negative.     Blood pressure 130/64, pulse 96, resp.  rate 14, height 5\' 3"  (1.6 m), weight 137 lb (62.1 kg).  Physical Exam Physical Exam  Constitutional: She is oriented to person, place, and time. She appears well-developed and well-nourished.  Eyes: Conjunctivae are normal. No scleral icterus.  Pale appearing conjunctiva  Neck: Neck supple.  Cardiovascular: Normal rate, regular rhythm and normal heart sounds.   Pulmonary/Chest: Effort normal and breath sounds normal.  Abdominal: Soft. Bowel sounds are normal. She exhibits no mass. There is no hepatomegaly. There is no tenderness. No hernia.    Lymphadenopathy:    She has no cervical adenopathy.  Neurological: She is alert and oriented to person, place, and time.  Skin: Skin is warm and dry.  Psychiatric: She has a normal mood and affect.    Data Reviewed Prior labs and pathology report.  Assessment    Colon polyp - postoperative sigmoid colectomy 6 mo ago Chronic anemia - hemoglobin ranging from 9-10 - patient takes iron supplements Family history of colon and breast cancer    Plan    Mammogram is scheduled for next week.  Follow up in 6 mos for exam and subsequent colonoscopy.   This information has been scribed by Gaspar Cola CMA.     [name] G 07/07/2016, 9:36 AM

## 2016-07-08 DIAGNOSIS — E119 Type 2 diabetes mellitus without complications: Secondary | ICD-10-CM | POA: Diagnosis not present

## 2016-07-08 DIAGNOSIS — D126 Benign neoplasm of colon, unspecified: Secondary | ICD-10-CM | POA: Diagnosis not present

## 2016-07-08 DIAGNOSIS — E785 Hyperlipidemia, unspecified: Secondary | ICD-10-CM | POA: Diagnosis not present

## 2016-07-08 DIAGNOSIS — R627 Adult failure to thrive: Secondary | ICD-10-CM | POA: Diagnosis not present

## 2016-07-10 DIAGNOSIS — R5381 Other malaise: Secondary | ICD-10-CM | POA: Diagnosis not present

## 2016-07-10 DIAGNOSIS — I10 Essential (primary) hypertension: Secondary | ICD-10-CM | POA: Diagnosis not present

## 2016-07-10 DIAGNOSIS — D518 Other vitamin B12 deficiency anemias: Secondary | ICD-10-CM | POA: Diagnosis not present

## 2016-07-10 DIAGNOSIS — E784 Other hyperlipidemia: Secondary | ICD-10-CM | POA: Diagnosis not present

## 2016-07-14 ENCOUNTER — Ambulatory Visit
Admission: RE | Admit: 2016-07-14 | Discharge: 2016-07-14 | Disposition: A | Discharge status: Home / Self Care | Payer: Commercial Managed Care - HMO | Source: Ambulatory Visit | Attending: Internal Medicine | Admitting: Internal Medicine

## 2016-07-14 DIAGNOSIS — Z1231 Encounter for screening mammogram for malignant neoplasm of breast: Secondary | ICD-10-CM | POA: Insufficient documentation

## 2016-07-16 DIAGNOSIS — E113212 Type 2 diabetes mellitus with mild nonproliferative diabetic retinopathy with macular edema, left eye: Secondary | ICD-10-CM | POA: Diagnosis not present

## 2016-07-16 DIAGNOSIS — H2589 Other age-related cataract: Secondary | ICD-10-CM | POA: Diagnosis not present

## 2016-07-21 DIAGNOSIS — E113312 Type 2 diabetes mellitus with moderate nonproliferative diabetic retinopathy with macular edema, left eye: Secondary | ICD-10-CM | POA: Diagnosis not present

## 2016-07-30 DIAGNOSIS — H2589 Other age-related cataract: Secondary | ICD-10-CM | POA: Diagnosis not present

## 2016-08-04 ENCOUNTER — Encounter: Payer: Self-pay | Admitting: *Deleted

## 2016-08-05 ENCOUNTER — Encounter
Admission: RE | Disposition: A | Discharge status: Home / Self Care | Payer: Self-pay | Source: Ambulatory Visit | Attending: Ophthalmology

## 2016-08-05 ENCOUNTER — Ambulatory Visit
Admission: RE | Admit: 2016-08-05 | Discharge: 2016-08-05 | Disposition: A | Discharge status: Home / Self Care | Payer: Medicare HMO | Source: Ambulatory Visit | Attending: Ophthalmology | Admitting: Ophthalmology

## 2016-08-05 ENCOUNTER — Ambulatory Visit: Payer: Medicare HMO | Admitting: Certified Registered Nurse Anesthetist

## 2016-08-05 DIAGNOSIS — Z7984 Long term (current) use of oral hypoglycemic drugs: Secondary | ICD-10-CM | POA: Insufficient documentation

## 2016-08-05 DIAGNOSIS — H2511 Age-related nuclear cataract, right eye: Secondary | ICD-10-CM | POA: Diagnosis not present

## 2016-08-05 DIAGNOSIS — D649 Anemia, unspecified: Secondary | ICD-10-CM | POA: Insufficient documentation

## 2016-08-05 DIAGNOSIS — I10 Essential (primary) hypertension: Secondary | ICD-10-CM | POA: Diagnosis not present

## 2016-08-05 DIAGNOSIS — M1990 Unspecified osteoarthritis, unspecified site: Secondary | ICD-10-CM | POA: Insufficient documentation

## 2016-08-05 DIAGNOSIS — Z79899 Other long term (current) drug therapy: Secondary | ICD-10-CM | POA: Diagnosis not present

## 2016-08-05 DIAGNOSIS — E1136 Type 2 diabetes mellitus with diabetic cataract: Secondary | ICD-10-CM | POA: Insufficient documentation

## 2016-08-05 DIAGNOSIS — E119 Type 2 diabetes mellitus without complications: Secondary | ICD-10-CM | POA: Diagnosis not present

## 2016-08-05 DIAGNOSIS — H2589 Other age-related cataract: Secondary | ICD-10-CM | POA: Diagnosis not present

## 2016-08-05 HISTORY — PX: CATARACT EXTRACTION W/PHACO: SHX586

## 2016-08-05 LAB — GLUCOSE, CAPILLARY: GLUCOSE-CAPILLARY: 133 mg/dL — AB (ref 65–99)

## 2016-08-05 SURGERY — PHACOEMULSIFICATION, CATARACT, WITH IOL INSERTION
Anesthesia: General | Site: Eye | Laterality: Right | Wound class: Clean

## 2016-08-05 MED ORDER — SODIUM CHLORIDE 0.9 % IV SOLN
INTRAVENOUS | Status: DC
  Administered 2016-08-05: 06:00:00 via INTRAVENOUS

## 2016-08-05 MED ORDER — ONDANSETRON HCL 4 MG/2ML IJ SOLN: 4.0000 mg | Freq: Once | INTRAMUSCULAR | Status: DC | PRN

## 2016-08-05 MED ORDER — EPINEPHRINE PF 1 MG/ML IJ SOLN
INTRAMUSCULAR | Status: AC
  Filled 2016-08-05: qty 1

## 2016-08-05 MED ORDER — MOXIFLOXACIN HCL 0.5 % OP SOLN: 1.0000 [drp] | OPHTHALMIC | Status: DC | PRN

## 2016-08-05 MED ORDER — FENTANYL CITRATE (PF) 250 MCG/5ML IJ SOLN
INTRAMUSCULAR | Status: AC
  Filled 2016-08-05: qty 5

## 2016-08-05 MED ORDER — LIDOCAINE HCL (PF) 4 % IJ SOLN
INTRAOCULAR | Status: DC | PRN
  Administered 2016-08-05: 4 mL via OPHTHALMIC

## 2016-08-05 MED ORDER — EPINEPHRINE PF 1 MG/ML IJ SOLN
INTRAMUSCULAR | Status: DC | PRN
  Administered 2016-08-05: 1 mL via OPHTHALMIC

## 2016-08-05 MED ORDER — EPHEDRINE SULFATE 50 MG/ML IJ SOLN
INTRAMUSCULAR | Status: AC
  Filled 2016-08-05: qty 1

## 2016-08-05 MED ORDER — MIDAZOLAM HCL 2 MG/2ML IJ SOLN
INTRAMUSCULAR | Status: DC | PRN
  Administered 2016-08-05: 1 mg via INTRAVENOUS

## 2016-08-05 MED ORDER — MOXIFLOXACIN HCL 0.5 % OP SOLN
OPHTHALMIC | Status: AC
  Filled 2016-08-05: qty 3

## 2016-08-05 MED ORDER — FENTANYL CITRATE (PF) 100 MCG/2ML IJ SOLN
INTRAMUSCULAR | Status: DC | PRN
  Administered 2016-08-05: 50 ug via INTRAVENOUS

## 2016-08-05 MED ORDER — MOXIFLOXACIN HCL 0.5 % OP SOLN
OPHTHALMIC | Status: DC | PRN
  Administered 2016-08-05: 0.2 mL via OPHTHALMIC

## 2016-08-05 MED ORDER — FENTANYL CITRATE (PF) 100 MCG/2ML IJ SOLN: 25.0000 ug | INTRAMUSCULAR | Status: DC | PRN

## 2016-08-05 MED ORDER — CARBACHOL 0.01 % IO SOLN
INTRAOCULAR | Status: DC | PRN
  Administered 2016-08-05: 0.5 mL via INTRAOCULAR

## 2016-08-05 MED ORDER — ARMC OPHTHALMIC DILATING DROPS
1.0000 "application " | OPHTHALMIC | Status: AC
  Administered 2016-08-05 (×3): 1 via OPHTHALMIC

## 2016-08-05 MED ORDER — ARMC OPHTHALMIC DILATING DROPS
OPHTHALMIC | Status: AC
  Administered 2016-08-05: 1 via OPHTHALMIC
  Filled 2016-08-05: qty 0.4

## 2016-08-05 MED ORDER — NA CHONDROIT SULF-NA HYALURON 40-17 MG/ML IO SOLN
INTRAOCULAR | Status: AC
  Filled 2016-08-05: qty 1

## 2016-08-05 MED ORDER — TRYPAN BLUE 0.06 % OP SOLN
OPHTHALMIC | Status: AC
  Filled 2016-08-05: qty 0.5

## 2016-08-05 MED ORDER — POVIDONE-IODINE 5 % OP SOLN
OPHTHALMIC | Status: AC
  Filled 2016-08-05: qty 30

## 2016-08-05 MED ORDER — PHENYLEPHRINE HCL 10 MG/ML IJ SOLN
INTRAMUSCULAR | Status: AC
  Filled 2016-08-05: qty 1

## 2016-08-05 MED ORDER — MIDAZOLAM HCL 5 MG/5ML IJ SOLN
INTRAMUSCULAR | Status: AC
  Filled 2016-08-05: qty 5

## 2016-08-05 MED ORDER — PROPOFOL 10 MG/ML IV BOLUS
INTRAVENOUS | Status: AC
  Filled 2016-08-05: qty 20

## 2016-08-05 MED ORDER — DEXMEDETOMIDINE HCL IN NACL 200 MCG/50ML IV SOLN
INTRAVENOUS | Status: AC
  Filled 2016-08-05: qty 50

## 2016-08-05 MED ORDER — NA CHONDROIT SULF-NA HYALURON 40-17 MG/ML IO SOLN
INTRAOCULAR | Status: DC | PRN
  Administered 2016-08-05: 1 mL via INTRAOCULAR

## 2016-08-05 SURGICAL SUPPLY — 21 items
CANNULA ANT/CHMB 27GA (MISCELLANEOUS) ×3 IMPLANT
CUP MEDICINE 2OZ PLAST GRAD ST (MISCELLANEOUS) ×3 IMPLANT
GLOVE BIO SURGEON STRL SZ8 (GLOVE) ×3 IMPLANT
GLOVE BIOGEL M 6.5 STRL (GLOVE) ×3 IMPLANT
GLOVE SURG LX 8.0 MICRO (GLOVE) ×2
GLOVE SURG LX STRL 8.0 MICRO (GLOVE) ×1 IMPLANT
GOWN STRL REUS W/ TWL LRG LVL3 (GOWN DISPOSABLE) ×2 IMPLANT
GOWN STRL REUS W/TWL LRG LVL3 (GOWN DISPOSABLE) ×4
LENS IOL TECNIS ITEC 24.5 (Intraocular Lens) ×3 IMPLANT
PACK CATARACT (MISCELLANEOUS) ×3 IMPLANT
PACK CATARACT BRASINGTON LX (MISCELLANEOUS) ×3 IMPLANT
PACK EYE AFTER SURG (MISCELLANEOUS) ×3 IMPLANT
SOL BSS BAG (MISCELLANEOUS) ×3
SOL PREP PVP 2OZ (MISCELLANEOUS) ×3
SOLUTION BSS BAG (MISCELLANEOUS) ×1 IMPLANT
SOLUTION PREP PVP 2OZ (MISCELLANEOUS) ×1 IMPLANT
SYR 3ML LL SCALE MARK (SYRINGE) ×3 IMPLANT
SYR 5ML LL (SYRINGE) ×3 IMPLANT
SYR TB 1ML 27GX1/2 LL (SYRINGE) ×3 IMPLANT
WATER STERILE IRR 250ML POUR (IV SOLUTION) ×3 IMPLANT
WIPE NON LINTING 3.25X3.25 (MISCELLANEOUS) ×3 IMPLANT

## 2016-08-05 NOTE — Discharge Instructions (Signed)
Eye Surgery Discharge Instructions  Expect mild scratchy sensation or mild soreness. DO NOT RUB YOUR EYE!  The day of surgery:  Minimal physical activity, but bed rest is not required  No reading, computer work, or close hand work  No bending, lifting, or straining.  May watch TV  For 24 hours:  No driving, legal decisions, or alcoholic beverages  Safety precautions  Eat anything you prefer: It is better to start with liquids, then soup then solid foods.  _____ Eye patch should be worn until postoperative exam tomorrow.  ____ Solar shield eyeglasses should be worn for comfort in the sunlight/patch while sleeping  Resume all regular medications including aspirin or Coumadin if these were discontinued prior to surgery. You may shower, bathe, shave, or wash your hair. Tylenol may be taken for mild discomfort.  Call your doctor if you experience significant pain, nausea, or vomiting, fever > 101 or other signs of infection. [phone] or [phone] Specific instructions:  Follow-up Information    PORFILIO,WILLIAM LOUIS, MD Follow up on 08/06/2016.   Specialty:  Ophthalmology Why:  10:05 Contact information: [address] [place] [address] [phone]

## 2016-08-05 NOTE — Anesthesia Post-op Follow-up Note (Cosign Needed)
Anesthesia QCDR form completed.        

## 2016-08-05 NOTE — Anesthesia Postprocedure Evaluation (Signed)
Anesthesia Post Note  Patient: [name]  Procedure(s) Performed: Procedure(s) (LRB): CATARACT EXTRACTION PHACO AND INTRAOCULAR LENS PLACEMENT (IOC) (Right)  Anesthesia Type: General     Last Vitals:  Vitals:   08/05/16 0603  BP: (!) 189/77  Pulse: 76  Resp: 16  Temp: 36.7 C    Last Pain:  Vitals:   08/05/16 0603  TempSrc: Tympanic                 Bernardo Heater

## 2016-08-05 NOTE — H&P (Signed)
All labs reviewed. Abnormal studies sent to patients PCP when indicated.  Previous H&P reviewed, patient examined, there are NO CHANGES.  [name] LOUIS3/6/20187:12 AM

## 2016-08-05 NOTE — Anesthesia Preprocedure Evaluation (Addendum)
Anesthesia Evaluation  Patient identified by MRN, date of birth, ID band Patient awake    Reviewed: Allergy & Precautions, NPO status , Patient's Chart, lab work & pertinent test results  Airway Mallampati: II       Dental  (+) Upper Dentures, Lower Dentures   Pulmonary neg pulmonary ROS, PE   breath sounds clear to auscultation       Cardiovascular Exercise Tolerance: Good hypertension, Pt. on medications  Rhythm:Regular     Neuro/Psych negative neurological ROS  negative psych ROS   GI/Hepatic negative GI ROS, Neg liver ROS,   Endo/Other  diabetes, Type 2, Oral Hypoglycemic Agents  Renal/GU negative Renal ROS  negative genitourinary   Musculoskeletal  (+) Arthritis , Osteoarthritis,    Abdominal Normal abdominal exam  (+)   Peds negative pediatric ROS (+)  Hematology  (+) anemia ,   Anesthesia Other Findings   Reproductive/Obstetrics                             Anesthesia Physical  Anesthesia Plan  ASA: III  Anesthesia Plan: MAC   Post-op Pain Management:    Induction: Intravenous  Airway Management Planned: Nasal Cannula  Additional Equipment:   Intra-op Plan:   Post-operative Plan:   Informed Consent: I have reviewed the patients History and Physical, chart, labs and discussed the procedure including the risks, benefits and alternatives for the proposed anesthesia with the patient or authorized representative who has indicated his/her understanding and acceptance.     Plan Discussed with: CRNA  Anesthesia Plan Comments:        Anesthesia Quick Evaluation

## 2016-08-05 NOTE — Op Note (Signed)
PREOPERATIVE DIAGNOSIS:  Nuclear sclerotic cataract of the right eye.   POSTOPERATIVE DIAGNOSIS:  NUCLEAR SCLERTOIC CATARACT RIGHT EYE   OPERATIVE PROCEDURE: Procedure(s): CATARACT EXTRACTION PHACO AND INTRAOCULAR LENS PLACEMENT (IOC)   SURGEON:  Birder Robson, MD.   ANESTHESIA:  Anesthesiologist: Alvin Critchley, MD CRNA: Bernardo Heater, CRNA  1.      Managed anesthesia care. 2.      0.32ml of Shugarcaine was instilled in the eye following the paracentesis.   COMPLICATIONS:  None.   TECHNIQUE:   Stop and chop   DESCRIPTION OF PROCEDURE:  The patient was examined and consented in the preoperative holding area where the aforementioned topical anesthesia was applied to the right eye and then brought back to the Operating Room where the right eye was prepped and draped in the usual sterile ophthalmic fashion and a lid speculum was placed. A paracentesis was created with the side port blade and the anterior chamber was filled with viscoelastic. A near clear corneal incision was performed with the steel keratome. A continuous curvilinear capsulorrhexis was performed with a cystotome followed by the capsulorrhexis forceps. Hydrodissection and hydrodelineation were carried out with BSS on a blunt cannula. The lens was removed in a stop and chop  technique and the remaining cortical material was removed with the irrigation-aspiration handpiece. The capsular bag was inflated with viscoelastic and the Technis [id number]  lens was placed in the capsular bag without complication. The remaining viscoelastic was removed from the eye with the irrigation-aspiration handpiece. The wounds were hydrated. The anterior chamber was flushed with Miostat and the eye was inflated to physiologic pressure. 0.60ml of Vigamox was placed in the anterior chamber. The wounds were found to be water tight. The eye was dressed with Vigamox. The patient was given protective glasses to wear throughout the day and a shield with which to  sleep tonight. The patient was also given drops with which to begin a drop regimen today and will follow-up with me in one day.  Implant Name Type Inv. Item Serial No. Manufacturer Lot No. LRB No. Used  LENS IOL DIOP 24.5 - [id number] Intraocular Lens LENS IOL DIOP 24.5 [id number] AMO   Right 1   Procedure(s) with comments: CATARACT EXTRACTION PHACO AND INTRAOCULAR LENS PLACEMENT (IOC) (Right) - [place] 39.5 AP% 21.4 CDE 8.45 Fluid pack lot # [id number]  Electronically signed: [place] 08/05/2016 7:44 AM

## 2016-08-05 NOTE — Transfer of Care (Signed)
Immediate Anesthesia Transfer of Care Note  Patient: [name]  Procedure(s) Performed: Procedure(s) with comments: CATARACT EXTRACTION PHACO AND INTRAOCULAR LENS PLACEMENT (IOC) (Right) - [place] 39.5 AP% 21.4 CDE 8.45 Fluid pack lot # [id number]  Patient Location: PACU  Anesthesia Type:MAC  Level of Consciousness: awake, alert , oriented and patient cooperative  Airway & Oxygen Therapy: Patient Spontanous Breathing  Post-op Assessment: Report given to RN and Post -op Vital signs reviewed and stable  Post vital signs: Reviewed and stable  Last Vitals:  Vitals:   08/05/16 0603  BP: (!) 189/77  Pulse: 76  Resp: 16  Temp: 36.7 C    Last Pain:  Vitals:   08/05/16 0603  TempSrc: Tympanic         Complications: No apparent anesthesia complications

## 2016-08-05 NOTE — Addendum Note (Signed)
Addendum  created 08/05/16 0804 by Bernardo Heater, CRNA   Anesthesia Event deleted, Anesthesia Event edited

## 2016-09-08 DIAGNOSIS — E785 Hyperlipidemia, unspecified: Secondary | ICD-10-CM | POA: Diagnosis not present

## 2016-09-08 DIAGNOSIS — E119 Type 2 diabetes mellitus without complications: Secondary | ICD-10-CM | POA: Diagnosis not present

## 2016-09-08 DIAGNOSIS — R627 Adult failure to thrive: Secondary | ICD-10-CM | POA: Diagnosis not present

## 2016-09-23 DIAGNOSIS — E113312 Type 2 diabetes mellitus with moderate nonproliferative diabetic retinopathy with macular edema, left eye: Secondary | ICD-10-CM | POA: Diagnosis not present

## 2016-11-10 DIAGNOSIS — H59031 Cystoid macular edema following cataract surgery, right eye: Secondary | ICD-10-CM | POA: Diagnosis not present

## 2016-11-28 DIAGNOSIS — E119 Type 2 diabetes mellitus without complications: Secondary | ICD-10-CM | POA: Diagnosis not present

## 2016-11-28 DIAGNOSIS — E785 Hyperlipidemia, unspecified: Secondary | ICD-10-CM | POA: Diagnosis not present

## 2016-11-28 DIAGNOSIS — R627 Adult failure to thrive: Secondary | ICD-10-CM | POA: Diagnosis not present

## 2016-12-08 DIAGNOSIS — E113211 Type 2 diabetes mellitus with mild nonproliferative diabetic retinopathy with macular edema, right eye: Secondary | ICD-10-CM | POA: Diagnosis not present

## 2016-12-08 DIAGNOSIS — H59031 Cystoid macular edema following cataract surgery, right eye: Secondary | ICD-10-CM | POA: Diagnosis not present

## 2016-12-16 DIAGNOSIS — H4321 Crystalline deposits in vitreous body, right eye: Secondary | ICD-10-CM | POA: Diagnosis not present

## 2016-12-26 DIAGNOSIS — E785 Hyperlipidemia, unspecified: Secondary | ICD-10-CM | POA: Diagnosis not present

## 2016-12-26 DIAGNOSIS — R627 Adult failure to thrive: Secondary | ICD-10-CM | POA: Diagnosis not present

## 2016-12-26 DIAGNOSIS — E119 Type 2 diabetes mellitus without complications: Secondary | ICD-10-CM | POA: Diagnosis not present

## 2017-01-05 ENCOUNTER — Ambulatory Visit (INDEPENDENT_AMBULATORY_CARE_PROVIDER_SITE_OTHER): Payer: Medicare HMO | Admitting: General Surgery

## 2017-01-05 ENCOUNTER — Encounter: Payer: Self-pay | Admitting: General Surgery

## 2017-01-05 ENCOUNTER — Other Ambulatory Visit: Payer: Self-pay | Admitting: *Deleted

## 2017-01-05 VITALS — BP 148/72 | HR 78 | Resp 14 | Ht 63.0 in | Wt 142.0 lb

## 2017-01-05 DIAGNOSIS — Z800 Family history of malignant neoplasm of digestive organs: Secondary | ICD-10-CM

## 2017-01-05 DIAGNOSIS — D125 Benign neoplasm of sigmoid colon: Secondary | ICD-10-CM

## 2017-01-05 DIAGNOSIS — K635 Polyp of colon: Secondary | ICD-10-CM

## 2017-01-05 MED ORDER — POLYETHYLENE GLYCOL 3350 17 GM/SCOOP PO POWD: ORAL | 0 refills | Status: DC

## 2017-01-05 NOTE — Patient Instructions (Signed)
Plan for repeat colonoscopy.    Colonoscopy, Adult A colonoscopy is an exam to look at the entire large intestine. During the exam, a lubricated, bendable tube is inserted into the anus and then passed into the rectum, colon, and other parts of the large intestine. A colonoscopy is often done as a part of normal colorectal screening or in response to certain symptoms, such as anemia, persistent diarrhea, abdominal pain, and blood in the stool. The exam can help screen for and diagnose medical problems, including:  Tumors.  Polyps.  Inflammation.  Areas of bleeding.  Tell a health care provider about:  Any allergies you have.  All medicines you are taking, including vitamins, herbs, eye drops, creams, and over-the-counter medicines.  Any problems you or family members have had with anesthetic medicines.  Any blood disorders you have.  Any surgeries you have had.  Any medical conditions you have.  Any problems you have had passing stool. What are the risks? Generally, this is a safe procedure. However, problems may occur, including:  Bleeding.  A tear in the intestine.  A reaction to medicines given during the exam.  Infection (rare).  What happens before the procedure? Eating and drinking restrictions Follow instructions from your health care provider about eating and drinking, which may include:  A few days before the procedure - follow a low-fiber diet. Avoid nuts, seeds, dried fruit, raw fruits, and vegetables.  1-3 days before the procedure - follow a clear liquid diet. Drink only clear liquids, such as clear broth or bouillon, black coffee or tea, clear juice, clear soft drinks or sports drinks, gelatin dessert, and popsicles. Avoid any liquids that contain red or purple dye.  On the day of the procedure - do not eat or drink anything during the 2 hours before the procedure, or within the time period that your health care provider recommends.  Bowel prep If you  were prescribed an oral bowel prep to clean out your colon:  Take it as told by your health care provider. Starting the day before your procedure, you will need to drink a large amount of medicated liquid. The liquid will cause you to have multiple loose stools until your stool is almost clear or light green.  If your skin or anus gets irritated from diarrhea, you may use these to relieve the irritation: ? Medicated wipes, such as adult wet wipes with aloe and vitamin E. ? A skin soothing-product like petroleum jelly.  If you vomit while drinking the bowel prep, take a break for up to 60 minutes and then begin the bowel prep again. If vomiting continues and you cannot take the bowel prep without vomiting, call your health care provider.  General instructions  Ask your health care provider about changing or stopping your regular medicines. This is especially important if you are taking diabetes medicines or blood thinners.  Plan to have someone take you home from the hospital or clinic. What happens during the procedure?  An IV tube may be inserted into one of your veins.  You will be given medicine to help you relax (sedative).  To reduce your risk of infection: ? Your health care team will wash or sanitize their hands. ? Your anal area will be washed with soap.  You will be asked to lie on your side with your knees bent.  Your health care provider will lubricate a long, thin, flexible tube. The tube will have a camera and a light on the end.  The tube will be inserted into your anus.  The tube will be gently eased through your rectum and colon.  Air will be delivered into your colon to keep it open. You may feel some pressure or cramping.  The camera will be used to take images during the procedure.  A small tissue sample may be removed from your body to be examined under a microscope (biopsy). If any potential problems are found, the tissue will be sent to a lab for  testing.  If small polyps are found, your health care provider may remove them and have them checked for cancer cells.  The tube that was inserted into your anus will be slowly removed. The procedure may vary among health care providers and hospitals. What happens after the procedure?  Your blood pressure, heart rate, breathing rate, and blood oxygen level will be monitored until the medicines you were given have worn off.  Do not drive for 24 hours after the exam.  You may have a small amount of blood in your stool.  You may pass gas and have mild abdominal cramping or bloating due to the air that was used to inflate your colon during the exam.  It is up to you to get the results of your procedure. Ask your health care provider, or the department performing the procedure, when your results will be ready. This information is not intended to replace advice given to you by your health care provider. Make sure you discuss any questions you have with your health care provider. Document Released: 05/16/2000 Document Revised: 03/19/2016 Document Reviewed: 07/31/2015 Elsevier Interactive Patient Education  2018 [employer].

## 2017-01-05 NOTE — Progress Notes (Signed)
Dictation #1 [id number]  [id number] Patient ID: [name], female   DOB: [birth year], 71 y.o.   MRN: [id number]  Chief Complaint  Patient presents with  . Colonoscopy    HPI [name] is a 71 y.o. female is here today for a 6 month follow up. Sigmoid Colectomy done on 01/30/16 for a colon polyp. She states she is doing well. Reports that her bowels are moving regularly. Denies gastrointestinal issues. Pt has hx of chronic anemia.    HPI  Past Medical History:  Diagnosis Date  . Anemia   . Arthritis    hands and knees  . Colon polyp 01/30/2016  . Diabetes mellitus without complication ([place])   . Hypertension     Past Surgical History:  Procedure Laterality Date  . CATARACT EXTRACTION W/PHACO Right 08/05/2016   Procedure: CATARACT EXTRACTION PHACO AND INTRAOCULAR LENS PLACEMENT (IOC);  Surgeon: Birder Robson, MD;  Location: [facility] ORS;  Service: Ophthalmology;  Laterality: Right;  [place] 39.5 AP% 21.4 CDE 8.45 Fluid pack lot # [id number]  . COLONOSCOPY WITH PROPOFOL N/A 01/09/2016   Procedure: COLONOSCOPY WITH PROPOFOL;  Surgeon: Lollie Sails, MD;  Location: [facility] ENDOSCOPY;  Service: Endoscopy;  Laterality: N/A;  . LAPAROSCOPIC SIGMOID COLECTOMY N/A 01/30/2016   Tubulovillous adenoma    Family History  Problem Relation Age of Onset  . Breast cancer Sister        26's  . Colon cancer Sister     Social History Social History  Substance Use Topics  . Smoking status: Never Smoker  . Smokeless tobacco: Never Used  . Alcohol use No    Allergies  Allergen Reactions  . Oxycodone Nausea And Vomiting    Current Outpatient Prescriptions  Medication Sig Dispense Refill  . acetaminophen (TYLENOL) 500 MG tablet Take 500 mg by mouth every 6 (six) hours as needed for moderate pain or headache.    Marland Kitchen amLODipine (NORVASC) 5 MG tablet Take 5 mg by mouth daily.     Marland Kitchen atorvastatin (LIPITOR) 10 MG tablet Take 10 mg by mouth daily.    . ferrous sulfate 325 (65 FE) MG tablet  Take 325-650 mg by mouth 2 (two) times daily. Take 650mg  in the morning and 325mg  at night    . hydrochlorothiazide (HYDRODIURIL) 25 MG tablet Take 25 mg by mouth daily.    Marland Kitchen lisinopril (PRINIVIL,ZESTRIL) 40 MG tablet Take 40 mg by mouth daily.     . metFORMIN (GLUCOPHAGE) 500 MG tablet Take 1,000 mg by mouth 2 (two) times daily with a meal.     . Multiple Vitamin (MULTIVITAMIN WITH MINERALS) TABS tablet Take 1 tablet by mouth daily.    Marland Kitchen telmisartan (MICARDIS) 40 MG tablet     . polyethylene glycol powder (GLYCOLAX/MIRALAX) powder 255 grams one bottle for colonoscopy prep 255 g 0   No current facility-administered medications for this visit.     Review of Systems Review of Systems  Constitutional: Negative.   Respiratory: Negative.   Cardiovascular: Negative.   Gastrointestinal: Negative.     Blood pressure (!) 148/72, pulse 78, resp. rate 14, height 5\' 3"  (1.6 m), weight 142 lb (64.4 kg).  Physical Exam Physical Exam  Constitutional: She is oriented to person, place, and time. She appears well-developed and well-nourished.  Eyes: Conjunctivae are normal. No scleral icterus.  Cardiovascular: Normal rate, regular rhythm and normal heart sounds.   Pulmonary/Chest: Effort normal and breath sounds normal.  Abdominal: Bowel sounds are normal. There is no hepatosplenomegaly. There is no tenderness.  No hernia.  Lymphadenopathy:    She has no cervical adenopathy.  Neurological: She is alert and oriented to person, place, and time.  Skin: Skin is warm and dry.    Data Reviewed Previous notes reviewed   Assessment    Colon polyp - postoperative sigmoid colectomy. Stable exam, pt is doing well. She is due for a repeat colonoscopy at this time, procedure discussed, pt is agreeable.  Chronic anemia - being followed by PCP. Last recorded Hgb was over 9.0 last year.     Plan     Colonoscopy with possible biopsy/polypectomy prn: Information regarding the procedure, including its  potential risks and complications (including but not limited to perforation of the bowel, which may require emergency surgery to repair, and bleeding) was verbally given to the patient. Educational information regarding lower intestinal endoscopy was given to the patient. Written instructions for how to complete the bowel prep using Miralax were provided. The importance of drinking ample fluids to avoid dehydration as a result of the prep emphasized.  Follow-up TBA   HPI, Physical Exam, Assessment and Plan have been scribed under the direction and in the presence of Mckinley Jewel, MD.  Verlene Mayer, CMA     I have completed the exam and reviewed the above documentation for accuracy and completeness.  I agree with the above.  Haematologist has been used and any errors in dictation or transcription are unintentional.  Seeplaputhur G. Jamal Collin, M.D., F.A.C.S.  Junie Panning G 01/05/2017, 11:25 AM  Patient has been scheduled for a colonoscopy on 01-28-17 at [facility]. Miralax prescription has been sent in to the patient's pharmacy today. This patient has been asked to hold metformin day of colonoscopy prep and procedure. Colonoscopy instructions have been reviewed with the patient. This patient is aware to call the office if they have further questions.   Dominga Ferry, CMA

## 2017-01-06 DIAGNOSIS — E113211 Type 2 diabetes mellitus with mild nonproliferative diabetic retinopathy with macular edema, right eye: Secondary | ICD-10-CM | POA: Diagnosis not present

## 2017-01-20 ENCOUNTER — Other Ambulatory Visit: Payer: Self-pay | Admitting: General Surgery

## 2017-01-20 DIAGNOSIS — Z86010 Personal history of colonic polyps: Secondary | ICD-10-CM

## 2017-01-23 ENCOUNTER — Telehealth: Payer: Self-pay | Admitting: General Surgery

## 2017-01-23 NOTE — Telephone Encounter (Signed)
I CALLED PATIENT TO LET HER KNOW I SPOKE WITH DR Jamal Collin. HE STATED IF DR MASOUD OKED HER TO HAVE THE PROCEDURE THEN WE WERE A GO. IF HE FEELS SHE NEEDS TO WAIT THEN SHE WILL NEED TO CALL & RESCHEDULE [place]

## 2017-01-23 NOTE — Telephone Encounter (Signed)
PATIENT CALLED STATING SHE IS SCHEDULED FOR A COLONOSCOPY FOR Wednesday 01-28-17. SHE WANTED TO CHECK TO SEE IF IT WAS STILL OK TO HAVE.SHE IS HAVING AN ALLERGIC REACTION TO HER BLOOD PRESSURE MEDICATION & HAS CONTACTED DR MASOUD'S OFC.WAITING ON A RESPONSE FROM THEM.SHE HAS DEVELOPED A COUGH AND WANTED TO MAKE SURE SHE COULD STILL HAVE THE PROCEDURE?? PLEASE ADVISE. CALL PATIENT @ [phone]. (I HAVE ROUTED THIS TO DR Jamal Collin & PRINTED IT OUT & PLACED ON HIS DESK.

## 2017-01-26 ENCOUNTER — Telehealth: Payer: Self-pay | Admitting: *Deleted

## 2017-01-26 NOTE — Telephone Encounter (Signed)
I contacted patient this morning to find out what Dr. Jennette Kettle recommendations were as far as proceeding with colonoscopy which is scheduled for this Wednesday, 01-28-17.   Per patient, it is okay for her to proceed as scheduled per Dr. Lavera Guise. They recommended that patient continue to check her blood pressure and call their office today with readings.   Patient was instructed to let us know if anything changes between now and then.

## 2017-01-28 ENCOUNTER — Ambulatory Visit
Admission: RE | Admit: 2017-01-28 | Discharge: 2017-01-28 | Disposition: A | Discharge status: Home / Self Care | Payer: Medicare HMO | Source: Ambulatory Visit | Attending: General Surgery | Admitting: General Surgery

## 2017-01-28 ENCOUNTER — Encounter
Admission: RE | Disposition: A | Discharge status: Home / Self Care | Payer: Self-pay | Source: Ambulatory Visit | Attending: General Surgery

## 2017-01-28 ENCOUNTER — Ambulatory Visit: Payer: Medicare HMO | Admitting: Anesthesiology

## 2017-01-28 DIAGNOSIS — Z9049 Acquired absence of other specified parts of digestive tract: Secondary | ICD-10-CM | POA: Diagnosis not present

## 2017-01-28 DIAGNOSIS — Z79899 Other long term (current) drug therapy: Secondary | ICD-10-CM | POA: Insufficient documentation

## 2017-01-28 DIAGNOSIS — Z1211 Encounter for screening for malignant neoplasm of colon: Secondary | ICD-10-CM | POA: Diagnosis not present

## 2017-01-28 DIAGNOSIS — Z7984 Long term (current) use of oral hypoglycemic drugs: Secondary | ICD-10-CM | POA: Insufficient documentation

## 2017-01-28 DIAGNOSIS — Z86010 Personal history of colonic polyps: Secondary | ICD-10-CM | POA: Insufficient documentation

## 2017-01-28 DIAGNOSIS — E119 Type 2 diabetes mellitus without complications: Secondary | ICD-10-CM | POA: Insufficient documentation

## 2017-01-28 DIAGNOSIS — I10 Essential (primary) hypertension: Secondary | ICD-10-CM | POA: Insufficient documentation

## 2017-01-28 HISTORY — PX: COLONOSCOPY WITH PROPOFOL: SHX5780

## 2017-01-28 LAB — GLUCOSE, CAPILLARY: Glucose-Capillary: 171 mg/dL — ABNORMAL HIGH (ref 65–99)

## 2017-01-28 SURGERY — COLONOSCOPY WITH PROPOFOL
Anesthesia: General

## 2017-01-28 MED ORDER — MIDAZOLAM HCL 2 MG/2ML IJ SOLN
INTRAMUSCULAR | Status: AC
  Filled 2017-01-28: qty 2

## 2017-01-28 MED ORDER — PROPOFOL 10 MG/ML IV BOLUS
INTRAVENOUS | Status: AC
  Filled 2017-01-28: qty 20

## 2017-01-28 MED ORDER — FENTANYL CITRATE (PF) 100 MCG/2ML IJ SOLN
INTRAMUSCULAR | Status: DC | PRN
  Administered 2017-01-28 (×2): 50 ug via INTRAVENOUS

## 2017-01-28 MED ORDER — PROPOFOL 500 MG/50ML IV EMUL
INTRAVENOUS | Status: DC | PRN
  Administered 2017-01-28: 120 ug/kg/min via INTRAVENOUS

## 2017-01-28 MED ORDER — PROPOFOL 10 MG/ML IV BOLUS
INTRAVENOUS | Status: DC | PRN
  Administered 2017-01-28: 30 mg via INTRAVENOUS

## 2017-01-28 MED ORDER — LIDOCAINE HCL (PF) 2 % IJ SOLN
INTRAMUSCULAR | Status: AC
  Filled 2017-01-28: qty 2

## 2017-01-28 MED ORDER — FENTANYL CITRATE (PF) 100 MCG/2ML IJ SOLN
INTRAMUSCULAR | Status: AC
  Filled 2017-01-28: qty 2

## 2017-01-28 MED ORDER — SODIUM CHLORIDE 0.9 % IV SOLN
INTRAVENOUS | Status: DC
  Administered 2017-01-28: 09:00:00 via INTRAVENOUS

## 2017-01-28 MED ORDER — MIDAZOLAM HCL 2 MG/2ML IJ SOLN
INTRAMUSCULAR | Status: DC | PRN
  Administered 2017-01-28 (×2): 1 mg via INTRAVENOUS

## 2017-01-28 NOTE — H&P (Signed)
Please see OV note of 01/05/17. No changes in Hand P since then. Pt with sigmoid colectomy 1 yr ago for large polyp.Proceed with colonoscopy

## 2017-01-28 NOTE — Transfer of Care (Signed)
Immediate Anesthesia Transfer of Care Note  Patient: [name]  Procedure(s) Performed: Procedure(s): COLONOSCOPY WITH PROPOFOL (N/A)  Patient Location: PACU  Anesthesia Type:General  Level of Consciousness: awake and alert   Airway & Oxygen Therapy: Patient Spontanous Breathing and Patient connected to nasal cannula oxygen  Post-op Assessment: Report given to RN and Post -op Vital signs reviewed and stable  Post vital signs: Reviewed  Last Vitals:  Vitals:   01/28/17 0902  BP: (!) 162/66  Pulse: (!) 108  Resp: 18  Temp: (!) 35.6 C  SpO2: 100%    Last Pain:  Vitals:   01/28/17 0902  TempSrc: Tympanic         Complications: No apparent anesthesia complications

## 2017-01-28 NOTE — Op Note (Signed)
[facility] Patient Name: [name] Procedure Date: 01/28/2017 9:24 AM MRN: [id number] Account #: [id number] Date of Birth: 01-May-1946 Admit Type: Outpatient Age: 71 Room: [facility] ENDO ROOM 1 Gender: Female Note Status: Finalized Procedure:            Colonoscopy Indications:          High risk colon cancer surveillance: Personal history                        of colonic polyps Providers:            Levonte Molina G. Jamal Collin, MD Referring MD:         Cletis Athens, MD (Referring MD) Medicines:            Total IV Anesthesia (TIVA) Complications:        No immediate complications. Procedure:            Pre-Anesthesia Assessment:                       - Using IV propofol under the supervision of an                        anesthesiologist was determined to be medically                        necessary for this procedure based on review of the                        patient's medical history, medications, and prior                        anesthesia history.                       After obtaining informed consent, the colonoscope was                        passed under direct vision. Throughout the procedure,                        the patient's blood pressure, pulse, and oxygen                        saturations were monitored continuously. The                        Colonoscope was introduced through the anus and                        advanced to the the cecum, identified by the ileocecal                        valve. The colonoscopy was performed with moderate                        difficulty due to poor bowel prep. Successful                        completion of the procedure was aided by lavage. The  patient tolerated the procedure well. Findings:      The perianal and digital rectal examinations were normal. Sigmoid colon       anastamosis area looked clean      A moderate amount of stool with very dark fluid was found in  the entire       colon, making visualization difficult.      The exam was otherwise without abnormality. Impression:           - Stool in the entire examined colon.                       - The examination was otherwise normal.                       - No specimens collected. Recommendation:       - Discharge patient to home.                       - Resume previous diet.                       - Continue present medications.                       - Repeat colonoscopy in 3 years for surveillance. Procedure Code(s):    --- Professional ---                       [phone], Colonoscopy, flexible; diagnostic, including                        collection of specimen(s) by brushing or washing, when                        performed (separate procedure) Diagnosis Code(s):    --- Professional ---                       Z86.010, Personal history of colonic polyps CPT copyright 2016 American Medical Association. All rights reserved. The codes documented in this report are preliminary and upon coder review may  be revised to meet current compliance requirements. Christene Lye, MD 01/28/2017 10:05:53 AM This report has been signed electronically. Number of Addenda: 0 Note Initiated On: 01/28/2017 9:24 AM Scope Withdrawal Time: 0 hours 12 minutes 42 seconds  Total Procedure Duration: 0 hours 21 minutes 33 seconds       [facility]

## 2017-01-28 NOTE — Anesthesia Preprocedure Evaluation (Signed)
Anesthesia Evaluation  Patient identified by MRN, date of birth, ID band Patient awake    Reviewed: Allergy & Precautions, NPO status , Patient's Chart, lab work & pertinent test results  Airway Mallampati: II       Dental  (+) Upper Dentures, Lower Dentures   Pulmonary neg pulmonary ROS,    breath sounds clear to auscultation       Cardiovascular Exercise Tolerance: Good hypertension, Pt. on medications  Rhythm:Regular     Neuro/Psych    GI/Hepatic negative GI ROS, Neg liver ROS,   Endo/Other  diabetes, Type 2  Renal/GU negative Renal ROS     Musculoskeletal   Abdominal Normal abdominal exam  (+)   Peds  Hematology  (+) anemia ,   Anesthesia Other Findings   Reproductive/Obstetrics                             Anesthesia Physical Anesthesia Plan  ASA: II  Anesthesia Plan: General   Post-op Pain Management:    Induction: Intravenous  PONV Risk Score and Plan: 0  Airway Management Planned: Natural Airway and Nasal Cannula  Additional Equipment:   Intra-op Plan:   Post-operative Plan:   Informed Consent: I have reviewed the patients History and Physical, chart, labs and discussed the procedure including the risks, benefits and alternatives for the proposed anesthesia with the patient or authorized representative who has indicated his/her understanding and acceptance.     Plan Discussed with: Surgeon  Anesthesia Plan Comments:         Anesthesia Quick Evaluation

## 2017-01-28 NOTE — Anesthesia Postprocedure Evaluation (Signed)
Anesthesia Post Note  Patient: [name]  Procedure(s) Performed: Procedure(s) (LRB): COLONOSCOPY WITH PROPOFOL (N/A)  Patient location during evaluation: PACU Anesthesia Type: General Level of consciousness: awake Pain management: pain level controlled Vital Signs Assessment: post-procedure vital signs reviewed and stable Respiratory status: nonlabored ventilation Cardiovascular status: stable Anesthetic complications: no     Last Vitals:  Vitals:   01/28/17 1024 01/28/17 1034  BP: 105/66 114/61  Pulse: 74 78  Resp: 18 (!) 21  Temp:    SpO2: 100% 100%    Last Pain:  Vitals:   01/28/17 1004  TempSrc: Tympanic                 VAN STAVEREN,Zelene Barga

## 2017-01-28 NOTE — Anesthesia Post-op Follow-up Note (Signed)
Anesthesia QCDR form completed.        

## 2017-02-17 DIAGNOSIS — H59031 Cystoid macular edema following cataract surgery, right eye: Secondary | ICD-10-CM | POA: Diagnosis not present

## 2017-02-17 DIAGNOSIS — E113211 Type 2 diabetes mellitus with mild nonproliferative diabetic retinopathy with macular edema, right eye: Secondary | ICD-10-CM | POA: Diagnosis not present

## 2017-03-24 DIAGNOSIS — E113211 Type 2 diabetes mellitus with mild nonproliferative diabetic retinopathy with macular edema, right eye: Secondary | ICD-10-CM | POA: Diagnosis not present

## 2017-03-24 DIAGNOSIS — E113312 Type 2 diabetes mellitus with moderate nonproliferative diabetic retinopathy with macular edema, left eye: Secondary | ICD-10-CM | POA: Diagnosis not present

## 2017-05-04 DIAGNOSIS — E113211 Type 2 diabetes mellitus with mild nonproliferative diabetic retinopathy with macular edema, right eye: Secondary | ICD-10-CM | POA: Diagnosis not present

## 2017-08-05 IMAGING — US US BREAST LTD UNI LEFT INC AXILLA
1 series · 1 of 1 positions shown · non-contrast
Comparison: Screening mammogram dated 07/11/2015

CLINICAL DATA: 69-year-old female, callback from screening
mammogram for possible left breast asymmetry

EXAM:
DIGITAL DIAGNOSTIC LEFT MAMMOGRAM WITH 3D TOMOSYNTHESIS WITH CAD
ULTRASOUND LEFT BREAST

[Series 1: us breast ltd uni left inc axilla · 0.05mm/px · 1 of 1 slices shown]
[im 1/1]
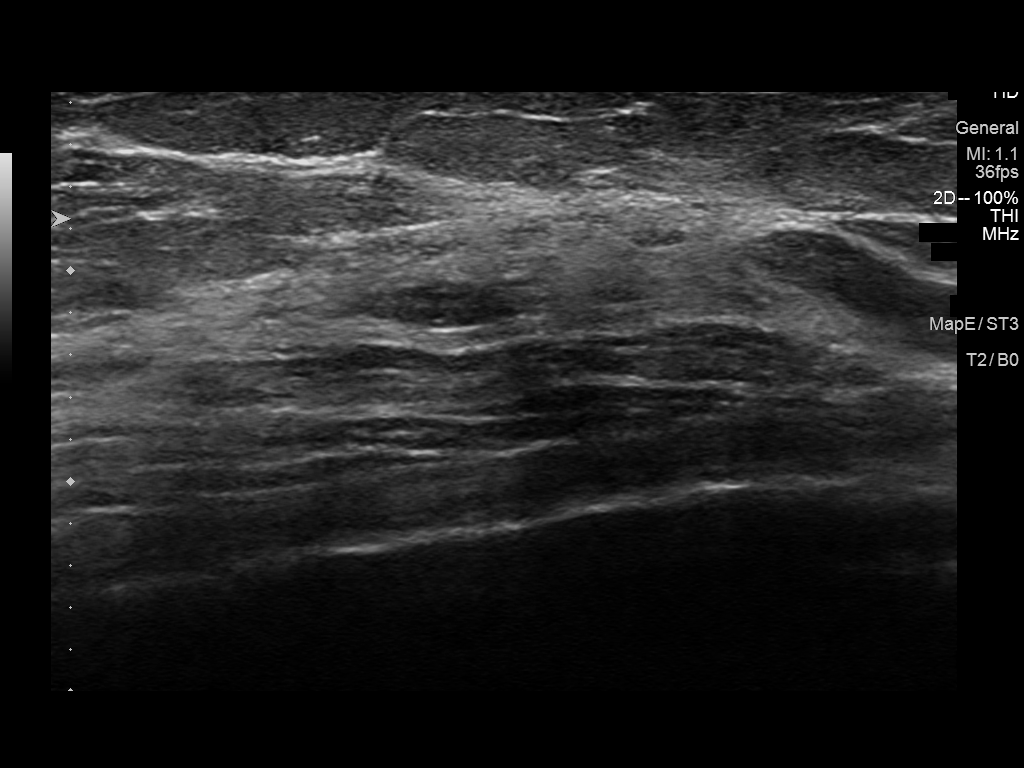

[1 of 1 positions shown; findings below may reference images not displayed]

ACR Breast Density Category b: There are scattered areas of
fibroglandular density.
FINDINGS: Cc and MLO spot-compression views of the left breast with
tomosynthesis were performed. The possible asymmetry persists but is
thought to likely represent normal asymmetric tissue. No suspicious
mass or other abnormality is identified in the area of concern.

Mammographic images were processed with CAD.

On physical exam, no discrete mass is felt in the area of concern
within the upper, outer left breast.

Targeted ultrasound of the upper, outer left breast was performed
demonstrating normal appearing tissue throughout the upper, outer
quadrant. No suspicious cystic or solid sonographic finding was
identified.
IMPRESSION: Probably benign left breast asymmetry.

RECOMMENDATION:
Left diagnostic mammogram and possible ultrasound in 6 months.

I have discussed the findings and recommendations with the patient.
Results were also provided in writing at the conclusion of the
visit. If applicable, a reminder letter will be sent to the patient
regarding the next appointment.

BI-RADS CATEGORY  3: Probably benign.

## 2017-09-03 DIAGNOSIS — H2000 Unspecified acute and subacute iridocyclitis: Secondary | ICD-10-CM | POA: Diagnosis not present

## 2017-09-11 DIAGNOSIS — H2000 Unspecified acute and subacute iridocyclitis: Secondary | ICD-10-CM | POA: Diagnosis not present

## 2017-09-23 ENCOUNTER — Other Ambulatory Visit: Payer: Self-pay

## 2017-09-23 DIAGNOSIS — Z1231 Encounter for screening mammogram for malignant neoplasm of breast: Secondary | ICD-10-CM

## 2017-09-23 NOTE — Progress Notes (Signed)
[place] 

## 2017-09-28 DIAGNOSIS — H2000 Unspecified acute and subacute iridocyclitis: Secondary | ICD-10-CM | POA: Diagnosis not present

## 2017-10-09 ENCOUNTER — Ambulatory Visit
Admission: RE | Admit: 2017-10-09 | Discharge: 2017-10-09 | Disposition: A | Discharge status: Home / Self Care | Payer: Medicare HMO | Source: Ambulatory Visit | Attending: General Surgery | Admitting: General Surgery

## 2017-10-09 DIAGNOSIS — Z1231 Encounter for screening mammogram for malignant neoplasm of breast: Secondary | ICD-10-CM | POA: Diagnosis not present

## 2017-10-20 ENCOUNTER — Ambulatory Visit: Payer: Medicare HMO | Admitting: General Surgery

## 2017-10-20 ENCOUNTER — Encounter: Payer: Self-pay | Admitting: General Surgery

## 2017-10-20 VITALS — BP 140/80 | HR 76 | Resp 16 | Ht 63.0 in | Wt 146.0 lb

## 2017-10-20 DIAGNOSIS — Z1231 Encounter for screening mammogram for malignant neoplasm of breast: Secondary | ICD-10-CM | POA: Insufficient documentation

## 2017-10-20 NOTE — Patient Instructions (Signed)
  Patient will be asked to return to the office in one year with a bilateral screening mammogram. Return in 2021 for colonoscopy. . The patient is aware to call back for any questions or concerns.

## 2017-10-20 NOTE — Progress Notes (Signed)
Patient ID: [name], female   DOB: [birth year], 72 y.o.   MRN: [id number]  Chief Complaint  Patient presents with  . Follow-up    HPI [name] is a 72 y.o. female who presents for a breast evaluation. The most recent mammogram was done on 10/09/2017. Patient does perform regular self breast checks and gets regular mammograms done.  Moves her bowels daily.   HPI  Past Medical History:  Diagnosis Date  . Anemia   . Arthritis    hands and knees  . Colon polyp 01/30/2016  . Diabetes mellitus without complication ([place]) 1093  . Hypertension     Past Surgical History:  Procedure Laterality Date  . CATARACT EXTRACTION W/PHACO Right 08/05/2016   Procedure: CATARACT EXTRACTION PHACO AND INTRAOCULAR LENS PLACEMENT (IOC);  Surgeon: Birder Robson, MD;  Location: [facility] ORS;  Service: Ophthalmology;  Laterality: Right;  [place] 39.5 AP% 21.4 CDE 8.45 Fluid pack lot # [id number]  . COLONOSCOPY WITH PROPOFOL N/A 01/09/2016   Procedure: COLONOSCOPY WITH PROPOFOL;  Surgeon: Lollie Sails, MD;  Location: [facility] ENDOSCOPY;  Service: Endoscopy;  Laterality: N/A;  . COLONOSCOPY WITH PROPOFOL N/A 01/28/2017   Procedure: COLONOSCOPY WITH PROPOFOL;  Surgeon: Christene Lye, MD;  Location: [facility] ENDOSCOPY;  Service: Endoscopy;  Laterality: N/A;  . LAPAROSCOPIC SIGMOID COLECTOMY N/A 01/30/2016   Tubulovillous adenoma    Family History  Problem Relation Age of Onset  . Breast cancer Sister        50's  . Colon cancer Sister     Social History Social History   Tobacco Use  . Smoking status: Never Smoker  . Smokeless tobacco: Never Used  Substance Use Topics  . Alcohol use: No  . Drug use: No    Allergies  Allergen Reactions  . Oxycodone Nausea And Vomiting    Current Outpatient Medications  Medication Sig Dispense Refill  . acetaminophen (TYLENOL) 500 MG tablet Take 500 mg by mouth every 6 (six) hours as needed for moderate pain or headache.    Marland Kitchen amLODipine (NORVASC) 5 MG  tablet Take 5 mg by mouth daily.     Marland Kitchen atorvastatin (LIPITOR) 10 MG tablet Take 10 mg by mouth daily.    . ferrous sulfate 325 (65 FE) MG tablet Take 325-650 mg by mouth 2 (two) times daily. Take 650mg  in the morning and 325mg  at night    . hydrochlorothiazide (HYDRODIURIL) 25 MG tablet Take 25 mg by mouth daily.    . metFORMIN (GLUCOPHAGE) 500 MG tablet Take 1,000 mg by mouth 2 (two) times daily with a meal.     . Multiple Vitamin (MULTIVITAMIN WITH MINERALS) TABS tablet Take 1 tablet by mouth daily.    . polyethylene glycol powder (GLYCOLAX/MIRALAX) powder 255 grams one bottle for colonoscopy prep 255 g 0  . telmisartan (MICARDIS) 40 MG tablet      No current facility-administered medications for this visit.     Review of Systems Review of Systems  Constitutional: Negative.   Respiratory: Negative.   Cardiovascular: Negative.     Blood pressure 140/80, pulse 76, resp. rate 16, height 5\' 3"  (1.6 m), weight 146 lb (66.2 kg).  Physical Exam Physical Exam  Constitutional: She is oriented to person, place, and time. She appears well-developed and well-nourished.  Eyes: Conjunctivae are normal. No scleral icterus.  Neck: Neck supple.  Cardiovascular: Normal rate, regular rhythm and normal heart sounds.  Pulmonary/Chest: Effort normal and breath sounds normal. Right breast exhibits no inverted nipple, no mass, no  nipple discharge, no skin change and no tenderness. Left breast exhibits no inverted nipple, no mass, no nipple discharge, no skin change and no tenderness.  Abdominal: Soft. Bowel sounds are normal. There is no tenderness.  Lymphadenopathy:    She has no cervical adenopathy.    She has no axillary adenopathy.  Neurological: She is alert and oriented to person, place, and time.  Skin: Skin is warm and dry.    Data Reviewed Oct 09, 2017 bilateral screening mammograms reviewed.  BI-RADS-1.  Assessment    Unremarkable abdominal exam.  Benign breast exam.     Plan  Patient desires to return to the office in one year with a bilateral screening mammogram. Return in 2021 for colonoscopy. .  The patient is aware to call back for any questions or concerns.  Repeat colonoscopy in 2023.   HPI, Physical Exam, Assessment and Plan have been scribed under the direction and in the presence of Hervey Ard, MD.  Gaspar Cola, CMA  I have completed the exam and reviewed the above documentation for accuracy and completeness.  I agree with the above.  Haematologist has been used and any errors in dictation or transcription are unintentional.  Hervey Ard, M.D., F.A.C.S.  [name] 10/20/2017, 9:01 PM

## 2018-03-19 DIAGNOSIS — E7849 Other hyperlipidemia: Secondary | ICD-10-CM | POA: Diagnosis not present

## 2018-03-19 DIAGNOSIS — E119 Type 2 diabetes mellitus without complications: Secondary | ICD-10-CM | POA: Diagnosis not present

## 2018-03-19 DIAGNOSIS — D518 Other vitamin B12 deficiency anemias: Secondary | ICD-10-CM | POA: Diagnosis not present

## 2018-03-19 DIAGNOSIS — I10 Essential (primary) hypertension: Secondary | ICD-10-CM | POA: Diagnosis not present

## 2018-03-19 DIAGNOSIS — R5381 Other malaise: Secondary | ICD-10-CM | POA: Diagnosis not present

## 2018-03-29 DIAGNOSIS — E113211 Type 2 diabetes mellitus with mild nonproliferative diabetic retinopathy with macular edema, right eye: Secondary | ICD-10-CM | POA: Diagnosis not present

## 2018-05-04 DIAGNOSIS — Z0000 Encounter for general adult medical examination without abnormal findings: Secondary | ICD-10-CM | POA: Diagnosis not present

## 2018-05-04 DIAGNOSIS — M069 Rheumatoid arthritis, unspecified: Secondary | ICD-10-CM | POA: Diagnosis not present

## 2018-05-04 DIAGNOSIS — E785 Hyperlipidemia, unspecified: Secondary | ICD-10-CM | POA: Diagnosis not present

## 2018-05-04 DIAGNOSIS — R627 Adult failure to thrive: Secondary | ICD-10-CM | POA: Diagnosis not present

## 2018-05-04 DIAGNOSIS — E119 Type 2 diabetes mellitus without complications: Secondary | ICD-10-CM | POA: Diagnosis not present

## 2018-05-05 DIAGNOSIS — M05611 Rheumatoid arthritis of right shoulder with involvement of other organs and systems: Secondary | ICD-10-CM | POA: Diagnosis not present

## 2018-05-05 DIAGNOSIS — M0560 Rheumatoid arthritis of unspecified site with involvement of other organs and systems: Secondary | ICD-10-CM | POA: Diagnosis not present

## 2018-06-07 DIAGNOSIS — E113211 Type 2 diabetes mellitus with mild nonproliferative diabetic retinopathy with macular edema, right eye: Secondary | ICD-10-CM | POA: Diagnosis not present

## 2018-06-07 DIAGNOSIS — E113312 Type 2 diabetes mellitus with moderate nonproliferative diabetic retinopathy with macular edema, left eye: Secondary | ICD-10-CM | POA: Diagnosis not present

## 2018-07-04 DIAGNOSIS — E119 Type 2 diabetes mellitus without complications: Secondary | ICD-10-CM | POA: Insufficient documentation

## 2018-07-04 DIAGNOSIS — R000 Tachycardia, unspecified: Secondary | ICD-10-CM | POA: Diagnosis not present

## 2018-07-04 DIAGNOSIS — Z96641 Presence of right artificial hip joint: Secondary | ICD-10-CM | POA: Diagnosis not present

## 2018-07-04 DIAGNOSIS — M80051A Age-related osteoporosis with current pathological fracture, right femur, initial encounter for fracture: Secondary | ICD-10-CM | POA: Diagnosis not present

## 2018-07-04 DIAGNOSIS — M25551 Pain in right hip: Secondary | ICD-10-CM | POA: Diagnosis not present

## 2018-07-04 DIAGNOSIS — E860 Dehydration: Secondary | ICD-10-CM | POA: Diagnosis not present

## 2018-07-04 DIAGNOSIS — Z7984 Long term (current) use of oral hypoglycemic drugs: Secondary | ICD-10-CM | POA: Diagnosis not present

## 2018-07-04 DIAGNOSIS — M810 Age-related osteoporosis without current pathological fracture: Secondary | ICD-10-CM | POA: Diagnosis not present

## 2018-07-04 DIAGNOSIS — I10 Essential (primary) hypertension: Secondary | ICD-10-CM | POA: Insufficient documentation

## 2018-07-04 DIAGNOSIS — Z79899 Other long term (current) drug therapy: Secondary | ICD-10-CM | POA: Diagnosis not present

## 2018-07-04 DIAGNOSIS — D638 Anemia in other chronic diseases classified elsewhere: Secondary | ICD-10-CM | POA: Diagnosis not present

## 2018-07-04 DIAGNOSIS — S72001A Fracture of unspecified part of neck of right femur, initial encounter for closed fracture: Secondary | ICD-10-CM | POA: Diagnosis not present

## 2018-07-04 DIAGNOSIS — Z471 Aftercare following joint replacement surgery: Secondary | ICD-10-CM | POA: Diagnosis not present

## 2018-07-04 DIAGNOSIS — M6281 Muscle weakness (generalized): Secondary | ICD-10-CM | POA: Diagnosis not present

## 2018-07-04 DIAGNOSIS — R279 Unspecified lack of coordination: Secondary | ICD-10-CM | POA: Diagnosis not present

## 2018-07-04 DIAGNOSIS — N179 Acute kidney failure, unspecified: Secondary | ICD-10-CM | POA: Diagnosis not present

## 2018-07-04 DIAGNOSIS — R5381 Other malaise: Secondary | ICD-10-CM | POA: Diagnosis not present

## 2018-07-04 DIAGNOSIS — S72009A Fracture of unspecified part of neck of unspecified femur, initial encounter for closed fracture: Secondary | ICD-10-CM | POA: Diagnosis not present

## 2018-07-04 DIAGNOSIS — W19XXXA Unspecified fall, initial encounter: Secondary | ICD-10-CM | POA: Diagnosis not present

## 2018-07-04 DIAGNOSIS — S72011A Unspecified intracapsular fracture of right femur, initial encounter for closed fracture: Secondary | ICD-10-CM | POA: Diagnosis not present

## 2018-07-04 DIAGNOSIS — D62 Acute posthemorrhagic anemia: Secondary | ICD-10-CM | POA: Diagnosis not present

## 2018-07-04 DIAGNOSIS — D649 Anemia, unspecified: Secondary | ICD-10-CM | POA: Insufficient documentation

## 2018-07-04 DIAGNOSIS — M06849 Other specified rheumatoid arthritis, unspecified hand: Secondary | ICD-10-CM | POA: Diagnosis not present

## 2018-07-04 HISTORY — PX: ANTERIOR APPROACH HEMI HIP ARTHROPLASTY: SHX6690

## 2018-07-08 DIAGNOSIS — M19049 Primary osteoarthritis, unspecified hand: Secondary | ICD-10-CM | POA: Diagnosis not present

## 2018-07-08 DIAGNOSIS — E119 Type 2 diabetes mellitus without complications: Secondary | ICD-10-CM | POA: Diagnosis not present

## 2018-07-08 DIAGNOSIS — I10 Essential (primary) hypertension: Secondary | ICD-10-CM | POA: Diagnosis not present

## 2018-07-08 DIAGNOSIS — D649 Anemia, unspecified: Secondary | ICD-10-CM | POA: Diagnosis not present

## 2018-07-08 DIAGNOSIS — Z96641 Presence of right artificial hip joint: Secondary | ICD-10-CM | POA: Diagnosis not present

## 2018-07-08 DIAGNOSIS — Z7984 Long term (current) use of oral hypoglycemic drugs: Secondary | ICD-10-CM | POA: Diagnosis not present

## 2018-07-08 DIAGNOSIS — Z7901 Long term (current) use of anticoagulants: Secondary | ICD-10-CM | POA: Diagnosis not present

## 2018-07-08 DIAGNOSIS — M069 Rheumatoid arthritis, unspecified: Secondary | ICD-10-CM | POA: Diagnosis not present

## 2018-07-08 DIAGNOSIS — S72001D Fracture of unspecified part of neck of right femur, subsequent encounter for closed fracture with routine healing: Secondary | ICD-10-CM | POA: Diagnosis not present

## 2018-07-09 DIAGNOSIS — S72001D Fracture of unspecified part of neck of right femur, subsequent encounter for closed fracture with routine healing: Secondary | ICD-10-CM | POA: Diagnosis not present

## 2018-07-09 DIAGNOSIS — D649 Anemia, unspecified: Secondary | ICD-10-CM | POA: Diagnosis not present

## 2018-07-09 DIAGNOSIS — Z96641 Presence of right artificial hip joint: Secondary | ICD-10-CM | POA: Diagnosis not present

## 2018-07-09 DIAGNOSIS — M069 Rheumatoid arthritis, unspecified: Secondary | ICD-10-CM | POA: Diagnosis not present

## 2018-07-09 DIAGNOSIS — Z7984 Long term (current) use of oral hypoglycemic drugs: Secondary | ICD-10-CM | POA: Diagnosis not present

## 2018-07-09 DIAGNOSIS — Z7901 Long term (current) use of anticoagulants: Secondary | ICD-10-CM | POA: Diagnosis not present

## 2018-07-09 DIAGNOSIS — M19049 Primary osteoarthritis, unspecified hand: Secondary | ICD-10-CM | POA: Diagnosis not present

## 2018-07-09 DIAGNOSIS — I10 Essential (primary) hypertension: Secondary | ICD-10-CM | POA: Diagnosis not present

## 2018-07-09 DIAGNOSIS — E119 Type 2 diabetes mellitus without complications: Secondary | ICD-10-CM | POA: Diagnosis not present

## 2018-07-12 ENCOUNTER — Other Ambulatory Visit: Payer: Self-pay

## 2018-07-12 DIAGNOSIS — E11311 Type 2 diabetes mellitus with unspecified diabetic retinopathy with macular edema: Secondary | ICD-10-CM | POA: Diagnosis not present

## 2018-07-12 NOTE — Patient Outreach (Signed)
[facility]) Care Management  07/12/2018  [name] [birth year] [id number]  Transition of care  Referral date: 07/12/18 Referral source: discharged from the [place] on 07/07/18 Insurance: [facility]  Telephone call to patient regarding transition of care referral. . Unable to reach patient. HIPAA compliant voice message left with call back phone number.   PLAN: RNCM will attempt 2nd telephone call to patient within 4 business days. RNCM will send outreach letter.   Quinn Plowman RN,BSN, CCM [facility] Telephonic  [phone]   PLAN:

## 2018-07-13 ENCOUNTER — Other Ambulatory Visit: Payer: Self-pay

## 2018-07-13 DIAGNOSIS — Z96641 Presence of right artificial hip joint: Secondary | ICD-10-CM | POA: Diagnosis not present

## 2018-07-13 DIAGNOSIS — Z7984 Long term (current) use of oral hypoglycemic drugs: Secondary | ICD-10-CM | POA: Diagnosis not present

## 2018-07-13 DIAGNOSIS — M19049 Primary osteoarthritis, unspecified hand: Secondary | ICD-10-CM | POA: Diagnosis not present

## 2018-07-13 DIAGNOSIS — E119 Type 2 diabetes mellitus without complications: Secondary | ICD-10-CM | POA: Diagnosis not present

## 2018-07-13 DIAGNOSIS — I10 Essential (primary) hypertension: Secondary | ICD-10-CM | POA: Diagnosis not present

## 2018-07-13 DIAGNOSIS — Z7901 Long term (current) use of anticoagulants: Secondary | ICD-10-CM | POA: Diagnosis not present

## 2018-07-13 DIAGNOSIS — M069 Rheumatoid arthritis, unspecified: Secondary | ICD-10-CM | POA: Diagnosis not present

## 2018-07-13 DIAGNOSIS — D649 Anemia, unspecified: Secondary | ICD-10-CM | POA: Diagnosis not present

## 2018-07-13 DIAGNOSIS — S72001D Fracture of unspecified part of neck of right femur, subsequent encounter for closed fracture with routine healing: Secondary | ICD-10-CM | POA: Diagnosis not present

## 2018-07-13 NOTE — Patient Outreach (Signed)
[facility]) Care Management  07/13/2018  [name] 03-26-1946 [id number]    Transition of care  Referral date: 07/12/18 Referral source: discharged from the [place] on 07/07/18 Insurance: [employer] Attempt #1  Telephone call to patient regarding transition of care  referral. Unable to reach patient. HIPAA compliant voice message left with call back phone number.   PLAN: RNCM will attempt 2nd telephone call to patient within 4 business days. RNCM will send outreach letter.   Quinn Plowman RN,BSN, [place] Telephonic  [phone]

## 2018-07-13 NOTE — Patient Outreach (Signed)
[facility]) Care Management  07/13/2018  Shermaine Rivet [birth year] [id number]  Transition of care  Referral date: 07/12/18 Referral source: Discharged from the [place] on 07/07/18 Insurance: [facility]  Telephone call to patient regarding transition of care referral. HIPAA verified by patient. Explained reason for call.  Patient states she was in the hospital due to having a partial right hip replacement following a fall.   Patient reports she has a follow up appointment with the surgeon on 07/30/18 and will have her staples removed at that time. Patient states [facility] is providing her home physical therapy and nurse follow up. Patient states her incision is healing well. RNCm reviewed signs of infection. Advised patient to call doctor for these signs/ symptoms.  Patient states she is taking her medications as prescribed.  Patient reports she has a follow up scheduled with her primary MD on 07/15/18. She states she has transportation to her appointments provided by family. Patient states she lives with her husband and daughter.  Patient denies any further needs or concerns at this time.  RNCM advised patient to notify MD of any changes in condition prior to scheduled appointment. RNCM provided contact name and number: [phone] or main office number [phone] and 24 hour nurse advise line [phone].  RNCM verified patient aware of 911 services for urgent/ emergent needs.  PLAN:  RNCM will close patient due to being assessed and having no further needs.  RNCM will send patient [facility] care management brochure/ magnet RNCM will send closure letter to patients primary MD   Quinn Plowman RN,BSN,CCM [facility] Telephonic  [phone]

## 2018-07-15 DIAGNOSIS — I10 Essential (primary) hypertension: Secondary | ICD-10-CM | POA: Diagnosis not present

## 2018-07-15 DIAGNOSIS — Z7984 Long term (current) use of oral hypoglycemic drugs: Secondary | ICD-10-CM | POA: Diagnosis not present

## 2018-07-15 DIAGNOSIS — M1990 Unspecified osteoarthritis, unspecified site: Secondary | ICD-10-CM | POA: Diagnosis not present

## 2018-07-15 DIAGNOSIS — S72001D Fracture of unspecified part of neck of right femur, subsequent encounter for closed fracture with routine healing: Secondary | ICD-10-CM | POA: Diagnosis not present

## 2018-07-15 DIAGNOSIS — E119 Type 2 diabetes mellitus without complications: Secondary | ICD-10-CM | POA: Diagnosis not present

## 2018-07-15 DIAGNOSIS — Z96641 Presence of right artificial hip joint: Secondary | ICD-10-CM | POA: Diagnosis not present

## 2018-07-15 DIAGNOSIS — D649 Anemia, unspecified: Secondary | ICD-10-CM | POA: Diagnosis not present

## 2018-07-15 DIAGNOSIS — Z7901 Long term (current) use of anticoagulants: Secondary | ICD-10-CM | POA: Diagnosis not present

## 2018-07-15 DIAGNOSIS — M069 Rheumatoid arthritis, unspecified: Secondary | ICD-10-CM | POA: Diagnosis not present

## 2018-07-15 DIAGNOSIS — M19049 Primary osteoarthritis, unspecified hand: Secondary | ICD-10-CM | POA: Diagnosis not present

## 2018-07-16 DIAGNOSIS — E119 Type 2 diabetes mellitus without complications: Secondary | ICD-10-CM | POA: Diagnosis not present

## 2018-07-16 DIAGNOSIS — Z7984 Long term (current) use of oral hypoglycemic drugs: Secondary | ICD-10-CM | POA: Diagnosis not present

## 2018-07-16 DIAGNOSIS — Z7901 Long term (current) use of anticoagulants: Secondary | ICD-10-CM | POA: Diagnosis not present

## 2018-07-16 DIAGNOSIS — D649 Anemia, unspecified: Secondary | ICD-10-CM | POA: Diagnosis not present

## 2018-07-16 DIAGNOSIS — Z96641 Presence of right artificial hip joint: Secondary | ICD-10-CM | POA: Diagnosis not present

## 2018-07-16 DIAGNOSIS — I10 Essential (primary) hypertension: Secondary | ICD-10-CM | POA: Diagnosis not present

## 2018-07-16 DIAGNOSIS — M069 Rheumatoid arthritis, unspecified: Secondary | ICD-10-CM | POA: Diagnosis not present

## 2018-07-16 DIAGNOSIS — M19049 Primary osteoarthritis, unspecified hand: Secondary | ICD-10-CM | POA: Diagnosis not present

## 2018-07-16 DIAGNOSIS — S72001D Fracture of unspecified part of neck of right femur, subsequent encounter for closed fracture with routine healing: Secondary | ICD-10-CM | POA: Diagnosis not present

## 2018-07-19 DIAGNOSIS — M069 Rheumatoid arthritis, unspecified: Secondary | ICD-10-CM | POA: Diagnosis not present

## 2018-07-19 DIAGNOSIS — I10 Essential (primary) hypertension: Secondary | ICD-10-CM | POA: Diagnosis not present

## 2018-07-19 DIAGNOSIS — Z96641 Presence of right artificial hip joint: Secondary | ICD-10-CM | POA: Diagnosis not present

## 2018-07-19 DIAGNOSIS — D649 Anemia, unspecified: Secondary | ICD-10-CM | POA: Diagnosis not present

## 2018-07-19 DIAGNOSIS — E119 Type 2 diabetes mellitus without complications: Secondary | ICD-10-CM | POA: Diagnosis not present

## 2018-07-19 DIAGNOSIS — Z7984 Long term (current) use of oral hypoglycemic drugs: Secondary | ICD-10-CM | POA: Diagnosis not present

## 2018-07-19 DIAGNOSIS — S72001D Fracture of unspecified part of neck of right femur, subsequent encounter for closed fracture with routine healing: Secondary | ICD-10-CM | POA: Diagnosis not present

## 2018-07-19 DIAGNOSIS — M19049 Primary osteoarthritis, unspecified hand: Secondary | ICD-10-CM | POA: Diagnosis not present

## 2018-07-19 DIAGNOSIS — Z7901 Long term (current) use of anticoagulants: Secondary | ICD-10-CM | POA: Diagnosis not present

## 2018-07-21 DIAGNOSIS — D649 Anemia, unspecified: Secondary | ICD-10-CM | POA: Diagnosis not present

## 2018-07-21 DIAGNOSIS — M19049 Primary osteoarthritis, unspecified hand: Secondary | ICD-10-CM | POA: Diagnosis not present

## 2018-07-21 DIAGNOSIS — Z96641 Presence of right artificial hip joint: Secondary | ICD-10-CM | POA: Diagnosis not present

## 2018-07-21 DIAGNOSIS — Z7901 Long term (current) use of anticoagulants: Secondary | ICD-10-CM | POA: Diagnosis not present

## 2018-07-21 DIAGNOSIS — I10 Essential (primary) hypertension: Secondary | ICD-10-CM | POA: Diagnosis not present

## 2018-07-21 DIAGNOSIS — S72001D Fracture of unspecified part of neck of right femur, subsequent encounter for closed fracture with routine healing: Secondary | ICD-10-CM | POA: Diagnosis not present

## 2018-07-21 DIAGNOSIS — Z7984 Long term (current) use of oral hypoglycemic drugs: Secondary | ICD-10-CM | POA: Diagnosis not present

## 2018-07-21 DIAGNOSIS — M069 Rheumatoid arthritis, unspecified: Secondary | ICD-10-CM | POA: Diagnosis not present

## 2018-07-21 DIAGNOSIS — E119 Type 2 diabetes mellitus without complications: Secondary | ICD-10-CM | POA: Diagnosis not present

## 2018-07-23 DIAGNOSIS — I10 Essential (primary) hypertension: Secondary | ICD-10-CM | POA: Diagnosis not present

## 2018-07-23 DIAGNOSIS — Z96641 Presence of right artificial hip joint: Secondary | ICD-10-CM | POA: Diagnosis not present

## 2018-07-23 DIAGNOSIS — D649 Anemia, unspecified: Secondary | ICD-10-CM | POA: Diagnosis not present

## 2018-07-23 DIAGNOSIS — Z7984 Long term (current) use of oral hypoglycemic drugs: Secondary | ICD-10-CM | POA: Diagnosis not present

## 2018-07-23 DIAGNOSIS — M19049 Primary osteoarthritis, unspecified hand: Secondary | ICD-10-CM | POA: Diagnosis not present

## 2018-07-23 DIAGNOSIS — Z7901 Long term (current) use of anticoagulants: Secondary | ICD-10-CM | POA: Diagnosis not present

## 2018-07-23 DIAGNOSIS — E119 Type 2 diabetes mellitus without complications: Secondary | ICD-10-CM | POA: Diagnosis not present

## 2018-07-23 DIAGNOSIS — M069 Rheumatoid arthritis, unspecified: Secondary | ICD-10-CM | POA: Diagnosis not present

## 2018-07-23 DIAGNOSIS — S72001D Fracture of unspecified part of neck of right femur, subsequent encounter for closed fracture with routine healing: Secondary | ICD-10-CM | POA: Diagnosis not present

## 2018-07-26 DIAGNOSIS — Z7984 Long term (current) use of oral hypoglycemic drugs: Secondary | ICD-10-CM | POA: Diagnosis not present

## 2018-07-26 DIAGNOSIS — S72001D Fracture of unspecified part of neck of right femur, subsequent encounter for closed fracture with routine healing: Secondary | ICD-10-CM | POA: Diagnosis not present

## 2018-07-26 DIAGNOSIS — I10 Essential (primary) hypertension: Secondary | ICD-10-CM | POA: Diagnosis not present

## 2018-07-26 DIAGNOSIS — E119 Type 2 diabetes mellitus without complications: Secondary | ICD-10-CM | POA: Diagnosis not present

## 2018-07-26 DIAGNOSIS — Z7901 Long term (current) use of anticoagulants: Secondary | ICD-10-CM | POA: Diagnosis not present

## 2018-07-26 DIAGNOSIS — M069 Rheumatoid arthritis, unspecified: Secondary | ICD-10-CM | POA: Diagnosis not present

## 2018-07-26 DIAGNOSIS — Z96641 Presence of right artificial hip joint: Secondary | ICD-10-CM | POA: Diagnosis not present

## 2018-07-26 DIAGNOSIS — M19049 Primary osteoarthritis, unspecified hand: Secondary | ICD-10-CM | POA: Diagnosis not present

## 2018-07-26 DIAGNOSIS — D649 Anemia, unspecified: Secondary | ICD-10-CM | POA: Diagnosis not present

## 2018-07-28 DIAGNOSIS — M069 Rheumatoid arthritis, unspecified: Secondary | ICD-10-CM | POA: Diagnosis not present

## 2018-07-28 DIAGNOSIS — S72001D Fracture of unspecified part of neck of right femur, subsequent encounter for closed fracture with routine healing: Secondary | ICD-10-CM | POA: Diagnosis not present

## 2018-07-28 DIAGNOSIS — E119 Type 2 diabetes mellitus without complications: Secondary | ICD-10-CM | POA: Diagnosis not present

## 2018-07-28 DIAGNOSIS — I10 Essential (primary) hypertension: Secondary | ICD-10-CM | POA: Diagnosis not present

## 2018-07-28 DIAGNOSIS — Z96641 Presence of right artificial hip joint: Secondary | ICD-10-CM | POA: Diagnosis not present

## 2018-07-28 DIAGNOSIS — Z7984 Long term (current) use of oral hypoglycemic drugs: Secondary | ICD-10-CM | POA: Diagnosis not present

## 2018-07-28 DIAGNOSIS — D649 Anemia, unspecified: Secondary | ICD-10-CM | POA: Diagnosis not present

## 2018-07-28 DIAGNOSIS — Z7901 Long term (current) use of anticoagulants: Secondary | ICD-10-CM | POA: Diagnosis not present

## 2018-07-28 DIAGNOSIS — M19049 Primary osteoarthritis, unspecified hand: Secondary | ICD-10-CM | POA: Diagnosis not present

## 2018-08-03 DIAGNOSIS — D649 Anemia, unspecified: Secondary | ICD-10-CM | POA: Diagnosis not present

## 2018-08-03 DIAGNOSIS — I10 Essential (primary) hypertension: Secondary | ICD-10-CM | POA: Diagnosis not present

## 2018-08-03 DIAGNOSIS — Z96641 Presence of right artificial hip joint: Secondary | ICD-10-CM | POA: Diagnosis not present

## 2018-08-03 DIAGNOSIS — Z7984 Long term (current) use of oral hypoglycemic drugs: Secondary | ICD-10-CM | POA: Diagnosis not present

## 2018-08-03 DIAGNOSIS — Z7901 Long term (current) use of anticoagulants: Secondary | ICD-10-CM | POA: Diagnosis not present

## 2018-08-03 DIAGNOSIS — S72001D Fracture of unspecified part of neck of right femur, subsequent encounter for closed fracture with routine healing: Secondary | ICD-10-CM | POA: Diagnosis not present

## 2018-08-03 DIAGNOSIS — M19049 Primary osteoarthritis, unspecified hand: Secondary | ICD-10-CM | POA: Diagnosis not present

## 2018-08-03 DIAGNOSIS — M069 Rheumatoid arthritis, unspecified: Secondary | ICD-10-CM | POA: Diagnosis not present

## 2018-08-03 DIAGNOSIS — E119 Type 2 diabetes mellitus without complications: Secondary | ICD-10-CM | POA: Diagnosis not present

## 2018-08-05 DIAGNOSIS — Z7901 Long term (current) use of anticoagulants: Secondary | ICD-10-CM | POA: Diagnosis not present

## 2018-08-05 DIAGNOSIS — S72001D Fracture of unspecified part of neck of right femur, subsequent encounter for closed fracture with routine healing: Secondary | ICD-10-CM | POA: Diagnosis not present

## 2018-08-05 DIAGNOSIS — E119 Type 2 diabetes mellitus without complications: Secondary | ICD-10-CM | POA: Diagnosis not present

## 2018-08-05 DIAGNOSIS — M069 Rheumatoid arthritis, unspecified: Secondary | ICD-10-CM | POA: Diagnosis not present

## 2018-08-05 DIAGNOSIS — D649 Anemia, unspecified: Secondary | ICD-10-CM | POA: Diagnosis not present

## 2018-08-05 DIAGNOSIS — I10 Essential (primary) hypertension: Secondary | ICD-10-CM | POA: Diagnosis not present

## 2018-08-05 DIAGNOSIS — Z7984 Long term (current) use of oral hypoglycemic drugs: Secondary | ICD-10-CM | POA: Diagnosis not present

## 2018-08-05 DIAGNOSIS — Z96641 Presence of right artificial hip joint: Secondary | ICD-10-CM | POA: Diagnosis not present

## 2018-08-05 DIAGNOSIS — M19049 Primary osteoarthritis, unspecified hand: Secondary | ICD-10-CM | POA: Diagnosis not present

## 2018-08-16 DIAGNOSIS — E113211 Type 2 diabetes mellitus with mild nonproliferative diabetic retinopathy with macular edema, right eye: Secondary | ICD-10-CM | POA: Diagnosis not present

## 2018-09-21 ENCOUNTER — Other Ambulatory Visit: Payer: Self-pay | Admitting: General Surgery

## 2018-09-21 DIAGNOSIS — Z1231 Encounter for screening mammogram for malignant neoplasm of breast: Secondary | ICD-10-CM

## 2018-09-27 ENCOUNTER — Other Ambulatory Visit: Payer: Self-pay

## 2018-09-27 DIAGNOSIS — Z1231 Encounter for screening mammogram for malignant neoplasm of breast: Secondary | ICD-10-CM

## 2018-11-19 ENCOUNTER — Other Ambulatory Visit: Payer: Self-pay

## 2018-11-19 ENCOUNTER — Ambulatory Visit
Admission: RE | Admit: 2018-11-19 | Discharge: 2018-11-19 | Disposition: A | Discharge status: Home / Self Care | Payer: Medicare HMO | Source: Ambulatory Visit | Attending: General Surgery | Admitting: General Surgery

## 2018-11-19 DIAGNOSIS — Z1231 Encounter for screening mammogram for malignant neoplasm of breast: Secondary | ICD-10-CM | POA: Insufficient documentation

## 2018-11-22 DIAGNOSIS — E11311 Type 2 diabetes mellitus with unspecified diabetic retinopathy with macular edema: Secondary | ICD-10-CM | POA: Diagnosis not present

## 2018-11-25 ENCOUNTER — Encounter: Payer: Self-pay | Admitting: General Surgery

## 2018-11-25 ENCOUNTER — Other Ambulatory Visit: Payer: Self-pay

## 2018-11-25 ENCOUNTER — Ambulatory Visit: Payer: Medicare HMO | Admitting: General Surgery

## 2018-11-25 DIAGNOSIS — D125 Benign neoplasm of sigmoid colon: Secondary | ICD-10-CM | POA: Diagnosis not present

## 2018-11-25 NOTE — Progress Notes (Signed)
Patient ID: [name], female   DOB: 17-Aug-1945, 73 y.o.   MRN: [id number]  Chief Complaint  Patient presents with  . Follow-up    mammogram    HPI [name] is a 73 y.o. female.  Here today for one year bilateral screening mammogram. No complaints. HPI  Past Medical History:  Diagnosis Date  . Anemia   . Arthritis    hands and knees  . Colon polyp 01/30/2016  . Diabetes mellitus without complication ([place]) 8250  . Hypertension     Past Surgical History:  Procedure Laterality Date  . CATARACT EXTRACTION W/PHACO Right 08/05/2016   Procedure: CATARACT EXTRACTION PHACO AND INTRAOCULAR LENS PLACEMENT (IOC);  Surgeon: Birder Robson, MD;  Location: [facility] ORS;  Service: Ophthalmology;  Laterality: Right;  [place] 39.5 AP% 21.4 CDE 8.45 Fluid pack lot # [id number]  . COLONOSCOPY WITH PROPOFOL N/A 01/09/2016   Procedure: COLONOSCOPY WITH PROPOFOL;  Surgeon: Lollie Sails, MD;  Location[facility] ENDOSCOPY;  Service: Endoscopy;  Laterality: N/A;  . COLONOSCOPY WITH PROPOFOL N/A 01/28/2017   Procedure: COLONOSCOPY WITH PROPOFOL;  Surgeon: Christene Lye, MD;  Location: [facility] ENDOSCOPY;  Service: Endoscopy;  Laterality: N/A;  . LAPAROSCOPIC SIGMOID COLECTOMY N/A 01/30/2016   Tubulovillous adenoma    Family History  Problem Relation Age of Onset  . Breast cancer Sister        10's  . Colon cancer Sister     Social History Social History   Tobacco Use  . Smoking status: Never Smoker  . Smokeless tobacco: Never Used  Substance Use Topics  . Alcohol use: No  . Drug use: No    Allergies  Allergen Reactions  . Oxycodone Nausea And Vomiting    Current Outpatient Medications  Medication Sig Dispense Refill  . acetaminophen (TYLENOL) 500 MG tablet Take 500 mg by mouth every 6 (six) hours as needed for moderate pain or headache.    Marland Kitchen amLODipine (NORVASC) 5 MG tablet Take 5 mg by mouth daily.     Marland Kitchen atorvastatin (LIPITOR) 10 MG tablet Take 10 mg by mouth daily.    .  ferrous sulfate 325 (65 FE) MG tablet Take 325-650 mg by mouth 2 (two) times daily. Take 650mg  in the morning and 325mg  at night    . hydrochlorothiazide (HYDRODIURIL) 25 MG tablet Take 25 mg by mouth daily.    . metFORMIN (GLUCOPHAGE) 500 MG tablet Take 1,000 mg by mouth 2 (two) times daily with a meal.     . Multiple Vitamin (MULTIVITAMIN WITH MINERALS) TABS tablet Take 1 tablet by mouth daily.    . vitamin B-12 (CYANOCOBALAMIN) 1000 MCG tablet Take 1,000 mcg by mouth daily.     No current facility-administered medications for this visit.     Review of Systems Review of Systems  Constitutional: Negative.   Respiratory: Negative.   Cardiovascular: Negative.     Blood pressure (!) 168/81, pulse 88, temperature (!) 97.5 F (36.4 C), temperature source Temporal, resp. rate 18, height 5\' 3"  (1.6 m), weight 139 lb 3.2 oz (63.1 kg), SpO2 98 %.  Physical Exam Physical Exam Constitutional:      Appearance: She is well-developed.  Eyes:     Conjunctiva/sclera: Conjunctivae normal.  Neck:     Musculoskeletal: Normal range of motion.  Cardiovascular:     Rate and Rhythm: Normal rate and regular rhythm.     Heart sounds: Normal heart sounds.  Pulmonary:     Effort: Pulmonary effort is normal.  Breath sounds: Normal breath sounds.  Skin:    General: Skin is warm and dry.  Neurological:     Mental Status: She is alert and oriented to person, place, and time.     Data Reviewed Bilateral screening mammograms dated November 19, 2018 were independently reviewed.  BI-RADS-1.  2018 colonoscopy was normal.  Assessment Benign breast exam.  Past history tubulovillous adenoma of the colon.  Follow-up colonoscopy in 2021.  Plan We will contact you June 2021 to schedule your Mammogram and follow up appointment with Dr.Morocco Gipe.   We will talk to you about your Colonoscopy at the above appointment.    Please call our office if you have any questions or concerns.      HPI, Physical  Exam, Assessment and Plan have been scribed under the direction and in the presence of Robert Bellow, MD. Jonnie Finner, CMA  I have completed the exam and reviewed the above documentation for accuracy and completeness.  I agree with the above.  Haematologist has been used and any errors in dictation or transcription are unintentional.  Hervey Ard, M.D., F.A.C.S.  Forest Gleason Ayman Brull 11/26/2018, 6:00 PM

## 2018-11-25 NOTE — Patient Instructions (Addendum)
We will contact you June 2021 to schedule your Mammogram and follow up appointment with Dr.Byrnett.   We will talk to you about your Colonoscopy at the above appointment.    Please call our office if you have any questions or concerns.

## 2018-11-26 ENCOUNTER — Encounter: Payer: Self-pay | Admitting: General Surgery

## 2018-12-31 ENCOUNTER — Encounter: Payer: Self-pay | Admitting: General Surgery

## 2019-01-04 DIAGNOSIS — E11311 Type 2 diabetes mellitus with unspecified diabetic retinopathy with macular edema: Secondary | ICD-10-CM | POA: Diagnosis not present

## 2019-01-17 DIAGNOSIS — H2512 Age-related nuclear cataract, left eye: Secondary | ICD-10-CM | POA: Diagnosis not present

## 2019-01-24 DIAGNOSIS — E119 Type 2 diabetes mellitus without complications: Secondary | ICD-10-CM | POA: Diagnosis not present

## 2019-01-24 DIAGNOSIS — H2512 Age-related nuclear cataract, left eye: Secondary | ICD-10-CM | POA: Diagnosis not present

## 2019-01-26 ENCOUNTER — Encounter: Payer: Self-pay | Admitting: *Deleted

## 2019-01-26 ENCOUNTER — Other Ambulatory Visit: Payer: Self-pay

## 2019-01-28 ENCOUNTER — Other Ambulatory Visit
Admission: RE | Admit: 2019-01-28 | Discharge: 2019-01-28 | Disposition: A | Discharge status: Home / Self Care | Payer: Medicare HMO | Source: Ambulatory Visit | Attending: Ophthalmology | Admitting: Ophthalmology

## 2019-01-28 ENCOUNTER — Other Ambulatory Visit: Payer: Self-pay

## 2019-01-28 DIAGNOSIS — Z20828 Contact with and (suspected) exposure to other viral communicable diseases: Secondary | ICD-10-CM | POA: Diagnosis not present

## 2019-01-28 DIAGNOSIS — Z01812 Encounter for preprocedural laboratory examination: Secondary | ICD-10-CM | POA: Insufficient documentation

## 2019-01-29 LAB — SARS CORONAVIRUS 2 (TAT 6-24 HRS): SARS Coronavirus 2: NEGATIVE

## 2019-01-31 NOTE — Discharge Instructions (Signed)

## 2019-02-01 ENCOUNTER — Ambulatory Visit
Admission: RE | Admit: 2019-02-01 | Discharge: 2019-02-01 | Disposition: A | Discharge status: Home / Self Care | Payer: Medicare HMO | Source: Ambulatory Visit | Attending: Ophthalmology | Admitting: Ophthalmology

## 2019-02-01 ENCOUNTER — Other Ambulatory Visit: Payer: Self-pay

## 2019-02-01 ENCOUNTER — Ambulatory Visit: Payer: Medicare HMO | Admitting: Anesthesiology

## 2019-02-01 ENCOUNTER — Encounter
Admission: RE | Disposition: A | Discharge status: Home / Self Care | Payer: Self-pay | Source: Ambulatory Visit | Attending: Ophthalmology

## 2019-02-01 DIAGNOSIS — E7800 Pure hypercholesterolemia, unspecified: Secondary | ICD-10-CM | POA: Insufficient documentation

## 2019-02-01 DIAGNOSIS — E1136 Type 2 diabetes mellitus with diabetic cataract: Secondary | ICD-10-CM | POA: Insufficient documentation

## 2019-02-01 DIAGNOSIS — Z96641 Presence of right artificial hip joint: Secondary | ICD-10-CM | POA: Diagnosis not present

## 2019-02-01 DIAGNOSIS — H2512 Age-related nuclear cataract, left eye: Secondary | ICD-10-CM | POA: Insufficient documentation

## 2019-02-01 DIAGNOSIS — Z9849 Cataract extraction status, unspecified eye: Secondary | ICD-10-CM | POA: Insufficient documentation

## 2019-02-01 DIAGNOSIS — M1990 Unspecified osteoarthritis, unspecified site: Secondary | ICD-10-CM | POA: Insufficient documentation

## 2019-02-01 DIAGNOSIS — H25812 Combined forms of age-related cataract, left eye: Secondary | ICD-10-CM | POA: Diagnosis not present

## 2019-02-01 DIAGNOSIS — I10 Essential (primary) hypertension: Secondary | ICD-10-CM | POA: Insufficient documentation

## 2019-02-01 DIAGNOSIS — D649 Anemia, unspecified: Secondary | ICD-10-CM | POA: Diagnosis not present

## 2019-02-01 HISTORY — PX: CATARACT EXTRACTION W/PHACO: SHX586

## 2019-02-01 HISTORY — DX: Presence of dental prosthetic device (complete) (partial): Z97.2

## 2019-02-01 LAB — GLUCOSE, CAPILLARY: Glucose-Capillary: 77 mg/dL (ref 70–99)

## 2019-02-01 SURGERY — PHACOEMULSIFICATION, CATARACT, WITH IOL INSERTION
Anesthesia: Monitor Anesthesia Care | Site: Eye | Laterality: Left

## 2019-02-01 MED ORDER — LACTATED RINGERS IV SOLN: 100.0000 mL/h | INTRAVENOUS | Status: DC

## 2019-02-01 MED ORDER — ARMC OPHTHALMIC DILATING DROPS
1.0000 "application " | OPHTHALMIC | Status: DC | PRN
  Administered 2019-02-01 (×3): 1 via OPHTHALMIC

## 2019-02-01 MED ORDER — MOXIFLOXACIN HCL 0.5 % OP SOLN
OPHTHALMIC | Status: DC | PRN
  Administered 2019-02-01: 0.2 mL via OPHTHALMIC

## 2019-02-01 MED ORDER — BRIMONIDINE TARTRATE-TIMOLOL 0.2-0.5 % OP SOLN
OPHTHALMIC | Status: DC | PRN
  Administered 2019-02-01: 1 [drp] via OPHTHALMIC

## 2019-02-01 MED ORDER — TETRACAINE HCL 0.5 % OP SOLN
1.0000 [drp] | OPHTHALMIC | Status: DC | PRN
  Administered 2019-02-01 (×3): 1 [drp] via OPHTHALMIC

## 2019-02-01 MED ORDER — LIDOCAINE HCL (PF) 2 % IJ SOLN
INTRAOCULAR | Status: DC | PRN
  Administered 2019-02-01: 1 mL

## 2019-02-01 MED ORDER — EPINEPHRINE PF 1 MG/ML IJ SOLN
INTRAOCULAR | Status: DC | PRN
  Administered 2019-02-01: 12:00:00 110 mL via OPHTHALMIC

## 2019-02-01 MED ORDER — NA CHONDROIT SULF-NA HYALURON 40-17 MG/ML IO SOLN
INTRAOCULAR | Status: DC | PRN
  Administered 2019-02-01: 1 mL via INTRAOCULAR

## 2019-02-01 MED ORDER — MIDAZOLAM HCL 2 MG/2ML IJ SOLN
INTRAMUSCULAR | Status: DC | PRN
  Administered 2019-02-01: 2 mg via INTRAVENOUS

## 2019-02-01 MED ORDER — FENTANYL CITRATE (PF) 100 MCG/2ML IJ SOLN
INTRAMUSCULAR | Status: DC | PRN
  Administered 2019-02-01: 50 ug via INTRAVENOUS

## 2019-02-01 SURGICAL SUPPLY — 18 items
CANNULA ANT/CHMB 27GA (MISCELLANEOUS) ×6 IMPLANT
GLOVE SURG LX 8.0 MICRO (GLOVE) ×2
GLOVE SURG LX STRL 8.0 MICRO (GLOVE) ×1 IMPLANT
GLOVE SURG TRIUMPH 8.0 PF LTX (GLOVE) ×3 IMPLANT
GOWN STRL REUS W/ TWL LRG LVL3 (GOWN DISPOSABLE) ×2 IMPLANT
GOWN STRL REUS W/TWL LRG LVL3 (GOWN DISPOSABLE) ×4
LENS IOL TECNIS ITEC 25.0 (Intraocular Lens) ×3 IMPLANT
MARKER SKIN DUAL TIP RULER LAB (MISCELLANEOUS) ×3 IMPLANT
NDL RETROBULBAR .5 NSTRL (NEEDLE) ×3 IMPLANT
NEEDLE FILTER BLUNT 18X 1/2SAF (NEEDLE) ×2
NEEDLE FILTER BLUNT 18X1 1/2 (NEEDLE) ×1 IMPLANT
PACK EYE AFTER SURG (MISCELLANEOUS) ×3 IMPLANT
PACK OPTHALMIC (MISCELLANEOUS) ×3 IMPLANT
PACK PORFILIO (MISCELLANEOUS) ×3 IMPLANT
SYR 3ML LL SCALE MARK (SYRINGE) ×3 IMPLANT
SYR TB 1ML LUER SLIP (SYRINGE) ×3 IMPLANT
WATER STERILE IRR 250ML POUR (IV SOLUTION) ×3 IMPLANT
WIPE NON LINTING 3.25X3.25 (MISCELLANEOUS) ×3 IMPLANT

## 2019-02-01 NOTE — Anesthesia Procedure Notes (Signed)
Procedure Name: MAC Date/Time: 02/01/2019 11:29 AM Performed by: Cameron Ali, CRNA Pre-anesthesia Checklist: Patient identified, Emergency Drugs available, Suction available, Timeout performed and Patient being monitored Patient Re-evaluated:Patient Re-evaluated prior to induction Oxygen Delivery Method: Nasal cannula Placement Confirmation: positive ETCO2

## 2019-02-01 NOTE — Transfer of Care (Signed)
Immediate Anesthesia Transfer of Care Note  Patient: [name]  Procedure(s) Performed: CATARACT EXTRACTION PHACO AND INTRAOCULAR LENS PLACEMENT (IOC) LEFT DIABETIC  2:00.2  15.0%  18.58 (Left Eye)  Patient Location: PACU  Anesthesia Type: MAC  Level of Consciousness: awake, alert  and patient cooperative  Airway and Oxygen Therapy: Patient Spontanous Breathing and Patient connected to supplemental oxygen  Post-op Assessment: Post-op Vital signs reviewed, Patient's Cardiovascular Status Stable, Respiratory Function Stable, Patent Airway and No signs of Nausea or vomiting  Post-op Vital Signs: Reviewed and stable  Complications: No apparent anesthesia complications

## 2019-02-01 NOTE — Anesthesia Postprocedure Evaluation (Signed)
Anesthesia Post Note  Patient: [name]  Procedure(s) Performed: CATARACT EXTRACTION PHACO AND INTRAOCULAR LENS PLACEMENT (IOC) LEFT DIABETIC  2:00.2  15.0%  18.58 (Left Eye)  Patient location during evaluation: PACU Anesthesia Type: MAC Level of consciousness: awake and alert Pain management: pain level controlled Vital Signs Assessment: post-procedure vital signs reviewed and stable Respiratory status: spontaneous breathing, nonlabored ventilation, respiratory function stable and patient connected to nasal cannula oxygen Cardiovascular status: stable and blood pressure returned to baseline Postop Assessment: no apparent nausea or vomiting Anesthetic complications: no    Amilia Vandenbrink A  Elivia Robotham

## 2019-02-01 NOTE — Anesthesia Preprocedure Evaluation (Addendum)
Anesthesia Evaluation  Patient identified by MRN, date of birth, ID band Patient awake    Reviewed: Allergy & Precautions, NPO status , Patient's Chart, lab work & pertinent test results  History of Anesthesia Complications Negative for: history of anesthetic complications  Airway Mallampati: II  TM Distance: >3 FB Neck ROM: Full    Dental  (+) Upper Dentures, Lower Dentures   Pulmonary    breath sounds clear to auscultation       Cardiovascular hypertension, (-) angina(-) DOE  Rhythm:Regular Rate:Normal     Neuro/Psych    GI/Hepatic neg GERD  ,  Endo/Other  diabetes  Renal/GU      Musculoskeletal  (+) Arthritis ,   Abdominal   Peds  Hematology  (+) anemia ,   Anesthesia Other Findings   Reproductive/Obstetrics                            Anesthesia Physical Anesthesia Plan  ASA: II  Anesthesia Plan: MAC   Post-op Pain Management:    Induction: Intravenous  PONV Risk Score and Plan: 2 and TIVA and Midazolam  Airway Management Planned: Nasal Cannula  Additional Equipment:   Intra-op Plan:   Post-operative Plan:   Informed Consent: I have reviewed the patients History and Physical, chart, labs and discussed the procedure including the risks, benefits and alternatives for the proposed anesthesia with the patient or authorized representative who has indicated his/her understanding and acceptance.       Plan Discussed with: CRNA and Anesthesiologist  Anesthesia Plan Comments:       Anesthesia Quick Evaluation   Active Ambulatory Problems    Diagnosis Date Noted  . Benign neoplasm of sigmoid colon 01/30/2016  . Visit for screening mammogram 10/20/2017  . Anemia 07/04/2018  . Benign essential hypertension 01/16/2016  . Diabetes ([place]) 07/04/2018  . Fall 07/04/2018  . Fracture of right hip requiring operative repair ([place]) 07/04/2018  . Hypertension 07/04/2018    Resolved Ambulatory Problems    Diagnosis Date Noted  . No Resolved Ambulatory Problems   Past Medical History:  Diagnosis Date  . Arthritis   . Colon polyp 01/30/2016  . Diabetes mellitus without complication ([place]) 1610  . Wears dentures     CBC    Component Value Date/Time   WBC 14.1 (H) 02/01/2016 0430   RBC 3.37 (L) 02/01/2016 0430   HGB 9.4 (L) 02/01/2016 0430   HGB 9.9 (L) 01/22/2016 0956   HCT 26.6 (L) 02/01/2016 0430   HCT 29.9 (L) 01/22/2016 0956   PLT 181 02/01/2016 0430   PLT 250 01/22/2016 0956   MCV 78.8 (L) 02/01/2016 0430   MCV 80 01/22/2016 0956   MCH 27.8 02/01/2016 0430   MCHC 35.2 02/01/2016 0430   RDW 12.6 02/01/2016 0430   RDW 12.5 01/22/2016 0956   LYMPHSABS 3.8 (H) 02/01/2016 0430   LYMPHSABS 3.8 (H) 01/22/2016 0956   MONOABS 0.7 02/01/2016 0430   EOSABS 0.1 02/01/2016 0430   EOSABS 0.1 01/22/2016 0956   BASOSABS 0.0 02/01/2016 0430   BASOSABS 0.0 01/22/2016 0956    CMP     Component Value Date/Time   NA 135 01/31/2016 0430   NA 139 01/22/2016 0956   K 3.4 (L) 02/01/2016 0430   CL 105 01/31/2016 0430   CO2 23 01/31/2016 0430   GLUCOSE 112 (H) 01/31/2016 0430   BUN 16 01/31/2016 0430   BUN 29 (H) 01/22/2016 0956   CREATININE 0.85  01/31/2016 0430   CALCIUM 8.5 (L) 01/31/2016 0430   GFRNONAA >60 01/31/2016 0430   GFRAA >60 01/31/2016 0430    COAGS No results found for: INR, PTT  I have seen and consented the patient, Dorna Bloom. I have answered all of her questions regarding anesthesia. she is appropriately NPO.   Josephina Shih, MD Anesthesia

## 2019-02-01 NOTE — H&P (Signed)
All labs reviewed. Abnormal studies sent to patients PCP when indicated.  Previous H&P reviewed, patient examined, there are NO CHANGES.  [name] Porfilio9/1/202011:17 AM

## 2019-02-01 NOTE — Op Note (Signed)
PREOPERATIVE DIAGNOSIS:  Nuclear sclerotic cataract of the left eye.   POSTOPERATIVE DIAGNOSIS:  Nuclear sclerotic cataract of the left eye.   OPERATIVE PROCEDURE: Procedure(s): CATARACT EXTRACTION PHACO AND INTRAOCULAR LENS PLACEMENT (IOC) LEFT DIABETIC  2:00.2  15.0%  18.58   SURGEON:  Birder Robson, MD.   ANESTHESIA:  Anesthesiologist: Heniser, Fredric Dine, MD CRNA: Cameron Ali, CRNA  1.      Managed anesthesia care. 2.     0.78ml of Shugarcaine was instilled following the paracentesis   COMPLICATIONS:  None.   TECHNIQUE:   Stop and chop   DESCRIPTION OF PROCEDURE:  The patient was examined and consented in the preoperative holding area where the aforementioned topical anesthesia was applied to the left eye and then brought back to the Operating Room where the left eye was prepped and draped in the usual sterile ophthalmic fashion and a lid speculum was placed. A paracentesis was created with the side port blade and the anterior chamber was filled with viscoelastic. A near clear corneal incision was performed with the steel keratome. A continuous curvilinear capsulorrhexis was performed with a cystotome followed by the capsulorrhexis forceps. Hydrodissection and hydrodelineation were carried out with BSS on a blunt cannula. The lens was removed in a stop and chop  technique and the remaining cortical material was removed with the irrigation-aspiration handpiece. The capsular bag was inflated with viscoelastic and the Technis [id number] lens was placed in the capsular bag without complication. The remaining viscoelastic was removed from the eye with the irrigation-aspiration handpiece. The wounds were hydrated. The anterior chamber was flushed with  BSS and the eye was inflated to physiologic pressure. 0.9ml Vigamox was placed in the anterior chamber. The wounds were found to be water tight. The eye was dressed with [place]. The patient was given protective glasses to wear throughout the day and a  shield with which to sleep tonight. The patient was also given drops with which to begin a drop regimen today and will follow-up with me in one day. Implant Name Type Inv. Item Serial No. Manufacturer Lot No. LRB No. Used Action  LENS IOL DIOP 25.0 - [id number] Intraocular Lens LENS IOL DIOP 25.0 [id number]  Left 1 Implanted    Procedure(s) with comments: CATARACT EXTRACTION PHACO AND INTRAOCULAR LENS PLACEMENT (IOC) LEFT DIABETIC  2:00.2  15.0%  18.58 (Left) - Diabeetic - oral meds  Electronically signed: Birder Robson 02/01/2019 11:52 AM

## 2019-02-02 ENCOUNTER — Encounter: Payer: Self-pay | Admitting: Ophthalmology

## 2019-03-01 DIAGNOSIS — E11311 Type 2 diabetes mellitus with unspecified diabetic retinopathy with macular edema: Secondary | ICD-10-CM | POA: Diagnosis not present

## 2019-03-17 DIAGNOSIS — Z961 Presence of intraocular lens: Secondary | ICD-10-CM | POA: Diagnosis not present

## 2019-03-22 DIAGNOSIS — M069 Rheumatoid arthritis, unspecified: Secondary | ICD-10-CM | POA: Diagnosis not present

## 2019-03-22 DIAGNOSIS — Z0000 Encounter for general adult medical examination without abnormal findings: Secondary | ICD-10-CM | POA: Diagnosis not present

## 2019-03-22 DIAGNOSIS — E785 Hyperlipidemia, unspecified: Secondary | ICD-10-CM | POA: Diagnosis not present

## 2019-03-22 DIAGNOSIS — I10 Essential (primary) hypertension: Secondary | ICD-10-CM | POA: Diagnosis not present

## 2019-03-22 DIAGNOSIS — E119 Type 2 diabetes mellitus without complications: Secondary | ICD-10-CM | POA: Diagnosis not present

## 2019-03-23 DIAGNOSIS — R5381 Other malaise: Secondary | ICD-10-CM | POA: Diagnosis not present

## 2019-03-23 DIAGNOSIS — E7849 Other hyperlipidemia: Secondary | ICD-10-CM | POA: Diagnosis not present

## 2019-03-23 DIAGNOSIS — D518 Other vitamin B12 deficiency anemias: Secondary | ICD-10-CM | POA: Diagnosis not present

## 2019-03-23 DIAGNOSIS — E119 Type 2 diabetes mellitus without complications: Secondary | ICD-10-CM | POA: Diagnosis not present

## 2019-03-23 DIAGNOSIS — I10 Essential (primary) hypertension: Secondary | ICD-10-CM | POA: Diagnosis not present

## 2019-04-12 DIAGNOSIS — E11311 Type 2 diabetes mellitus with unspecified diabetic retinopathy with macular edema: Secondary | ICD-10-CM | POA: Diagnosis not present

## 2019-04-14 DIAGNOSIS — E785 Hyperlipidemia, unspecified: Secondary | ICD-10-CM | POA: Diagnosis not present

## 2019-04-14 DIAGNOSIS — E119 Type 2 diabetes mellitus without complications: Secondary | ICD-10-CM | POA: Diagnosis not present

## 2019-04-14 DIAGNOSIS — I119 Hypertensive heart disease without heart failure: Secondary | ICD-10-CM | POA: Diagnosis not present

## 2019-04-14 DIAGNOSIS — M069 Rheumatoid arthritis, unspecified: Secondary | ICD-10-CM | POA: Diagnosis not present

## 2019-05-24 DIAGNOSIS — E11311 Type 2 diabetes mellitus with unspecified diabetic retinopathy with macular edema: Secondary | ICD-10-CM | POA: Diagnosis not present

## 2019-07-05 DIAGNOSIS — E113312 Type 2 diabetes mellitus with moderate nonproliferative diabetic retinopathy with macular edema, left eye: Secondary | ICD-10-CM | POA: Diagnosis not present

## 2019-08-16 DIAGNOSIS — E113313 Type 2 diabetes mellitus with moderate nonproliferative diabetic retinopathy with macular edema, bilateral: Secondary | ICD-10-CM | POA: Diagnosis not present

## 2019-09-19 DIAGNOSIS — E113312 Type 2 diabetes mellitus with moderate nonproliferative diabetic retinopathy with macular edema, left eye: Secondary | ICD-10-CM | POA: Diagnosis not present

## 2019-09-19 DIAGNOSIS — E119 Type 2 diabetes mellitus without complications: Secondary | ICD-10-CM

## 2019-09-19 DIAGNOSIS — Z0100 Encounter for examination of eyes and vision without abnormal findings: Secondary | ICD-10-CM

## 2019-09-19 HISTORY — DX: Encounter for examination of eyes and vision without abnormal findings: Z01.00

## 2019-09-19 HISTORY — DX: Encounter for examination of eyes and vision without abnormal findings: E11.9

## 2019-09-27 ENCOUNTER — Other Ambulatory Visit: Payer: Self-pay

## 2019-09-27 DIAGNOSIS — Z1231 Encounter for screening mammogram for malignant neoplasm of breast: Secondary | ICD-10-CM

## 2019-10-11 DIAGNOSIS — E113312 Type 2 diabetes mellitus with moderate nonproliferative diabetic retinopathy with macular edema, left eye: Secondary | ICD-10-CM | POA: Diagnosis not present

## 2019-10-11 DIAGNOSIS — E113311 Type 2 diabetes mellitus with moderate nonproliferative diabetic retinopathy with macular edema, right eye: Secondary | ICD-10-CM | POA: Diagnosis not present

## 2019-10-20 IMAGING — MG MM DIGITAL SCREENING BILAT W/ CAD
4 series · 4 of 4 positions shown · non-contrast
Comparison: Previous exam(s).

CLINICAL DATA: Screening.

EXAM:
DIGITAL SCREENING BILATERAL MAMMOGRAM WITH CAD

[L CC]
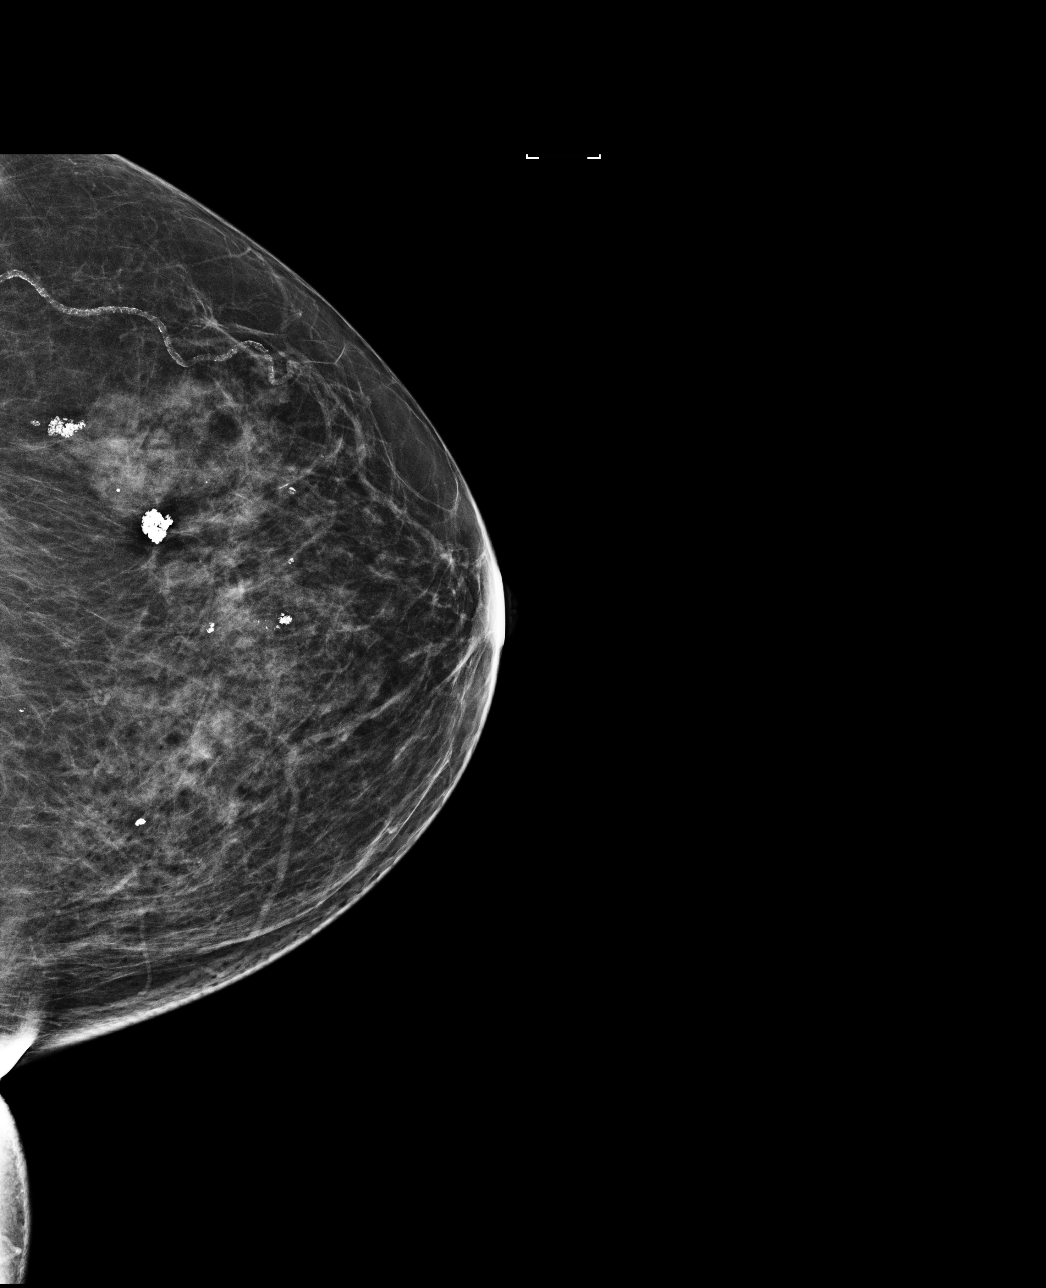

[R MLO]
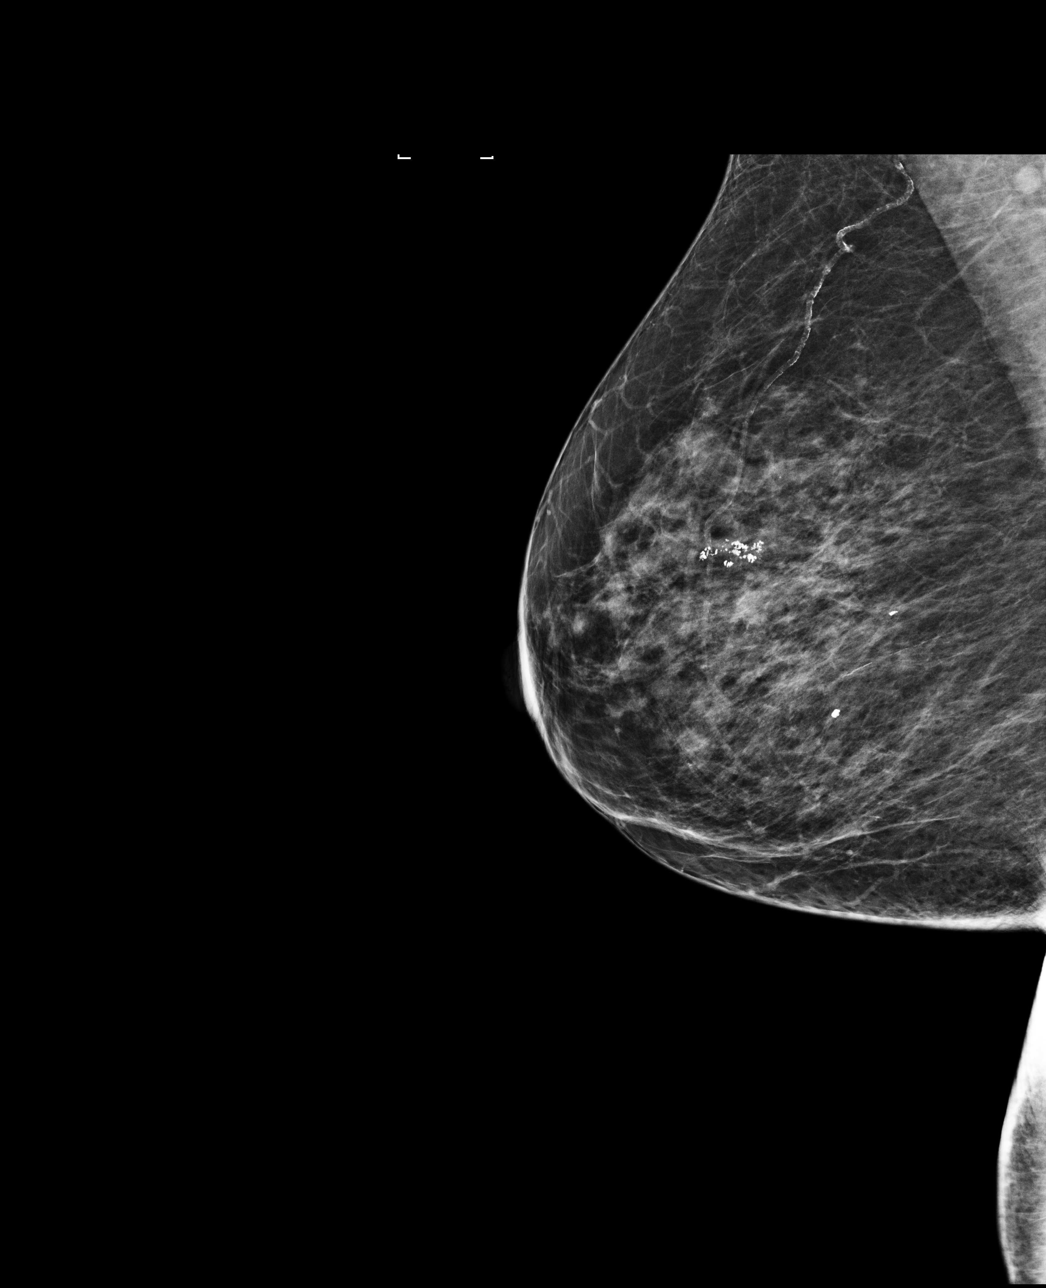

[L MLO]
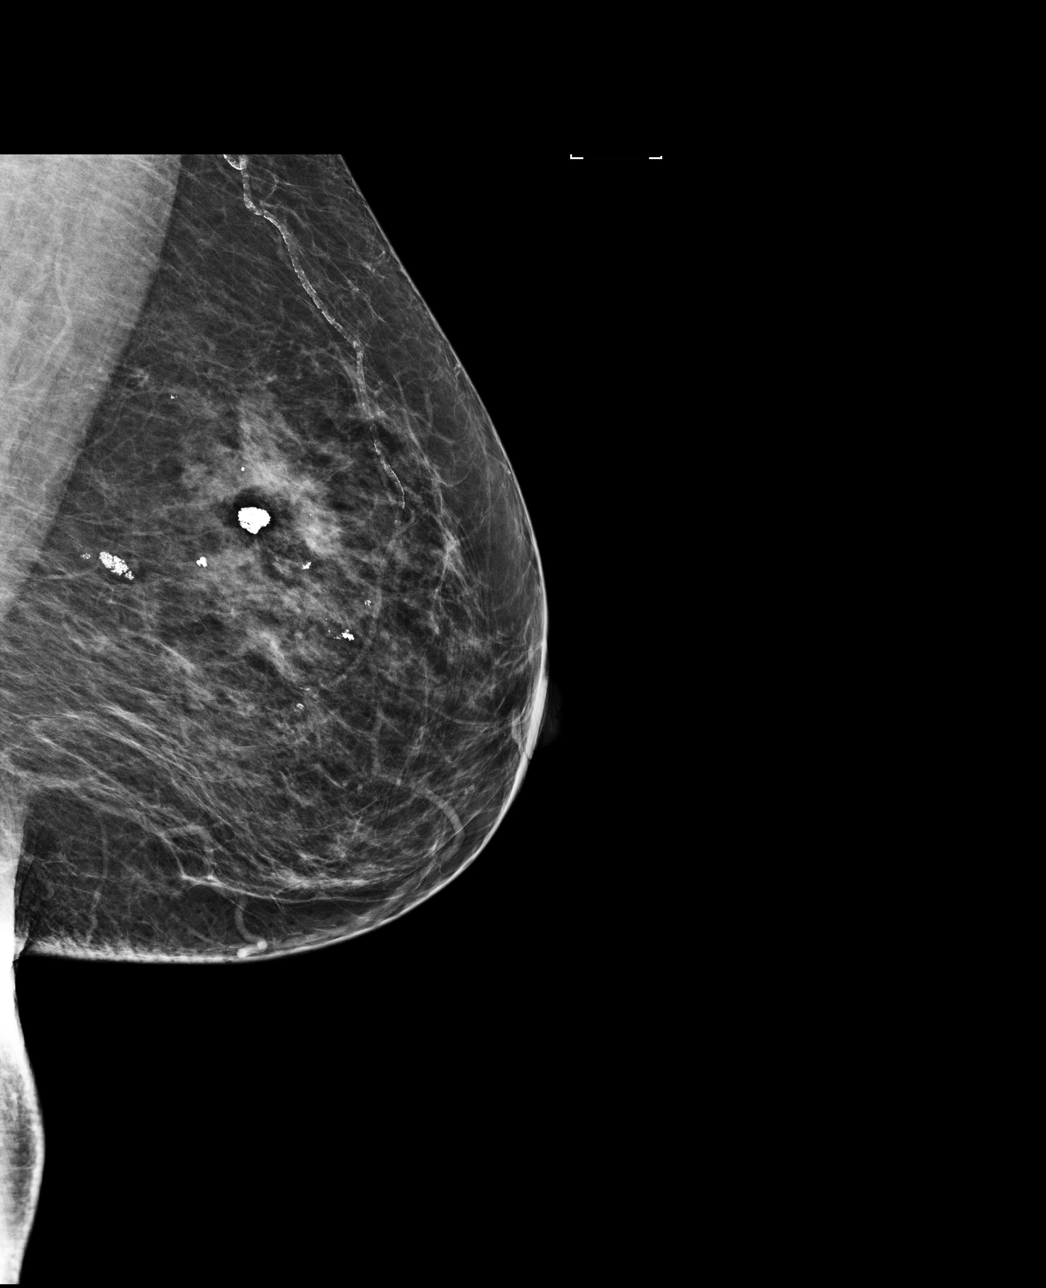

[R CC]
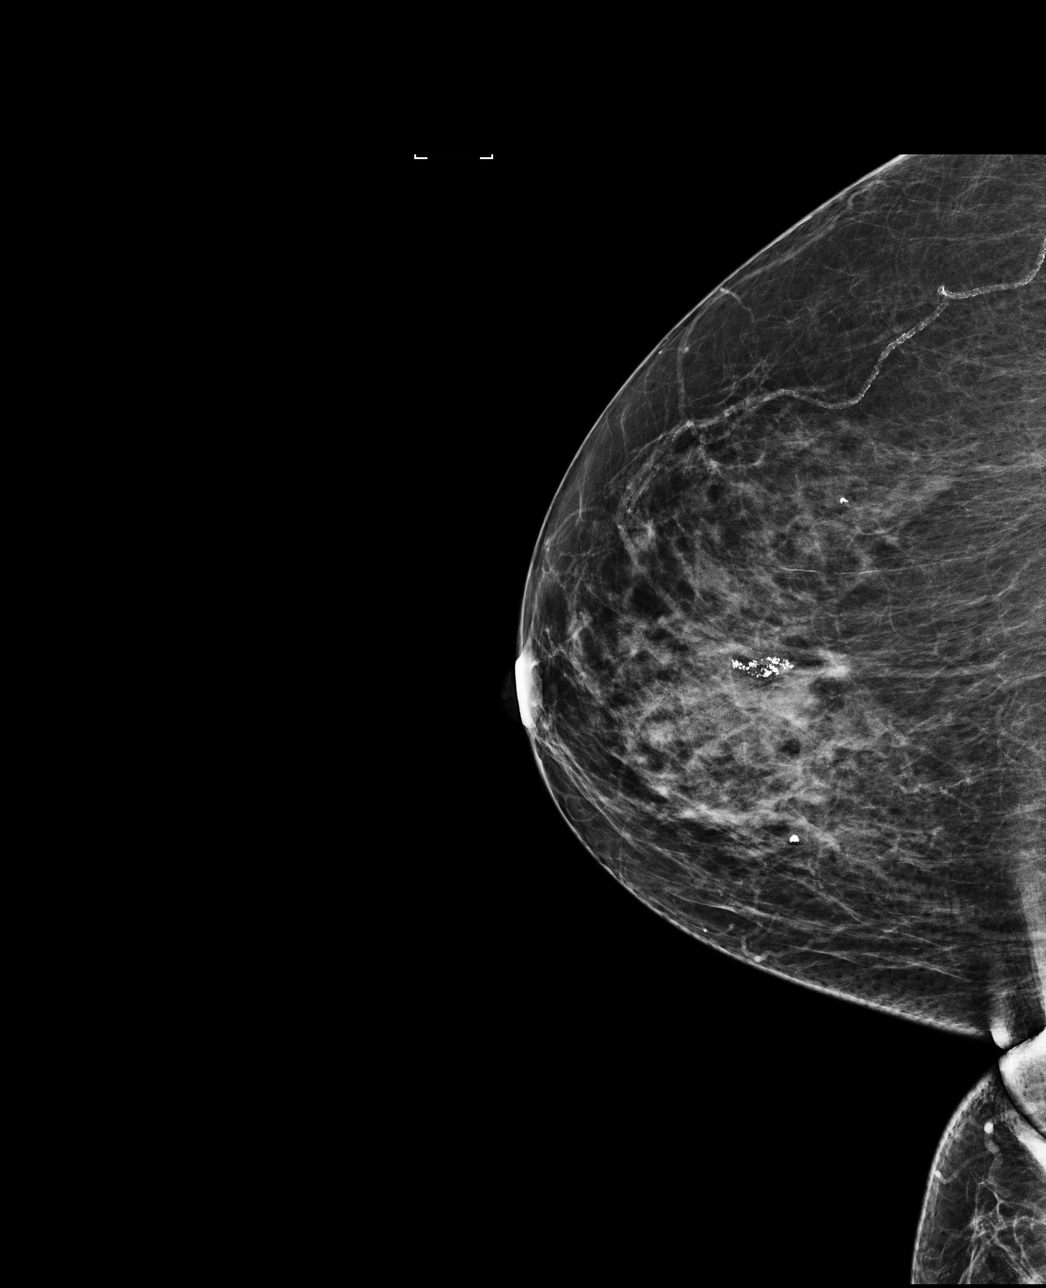

[4 of 4 positions shown; findings below may reference images not displayed]

ACR Breast Density Category c: The breast tissue is heterogeneously
dense, which may obscure small masses.
FINDINGS: There are no findings suspicious for malignancy. Images were
processed with CAD.
IMPRESSION: No mammographic evidence of malignancy. A result letter of this
screening mammogram will be mailed directly to the patient.

RECOMMENDATION:
Screening mammogram in one year. (Code:YJ-[id number])

BI-RADS CATEGORY  1: Negative.

## 2019-11-03 ENCOUNTER — Telehealth: Payer: Self-pay

## 2019-11-03 DIAGNOSIS — Z0100 Encounter for examination of eyes and vision without abnormal findings: Secondary | ICD-10-CM

## 2019-11-04 ENCOUNTER — Other Ambulatory Visit: Payer: Self-pay | Admitting: Internal Medicine

## 2019-11-10 NOTE — Telephone Encounter (Signed)
Patient has already had eye exam

## 2019-11-17 ENCOUNTER — Encounter: Payer: Self-pay | Admitting: Internal Medicine

## 2019-11-21 ENCOUNTER — Ambulatory Visit
Admission: RE | Admit: 2019-11-21 | Discharge: 2019-11-21 | Disposition: A | Discharge status: Home / Self Care | Payer: Medicare HMO | Source: Ambulatory Visit | Attending: Surgery | Admitting: Surgery

## 2019-11-21 DIAGNOSIS — Z1231 Encounter for screening mammogram for malignant neoplasm of breast: Secondary | ICD-10-CM | POA: Insufficient documentation

## 2019-11-28 ENCOUNTER — Ambulatory Visit: Payer: Medicare HMO | Admitting: Surgery

## 2019-11-28 ENCOUNTER — Encounter: Payer: Self-pay | Admitting: Surgery

## 2019-11-28 ENCOUNTER — Other Ambulatory Visit: Payer: Self-pay

## 2019-11-28 VITALS — BP 178/73 | HR 118 | Temp 97.3°F | Ht 63.0 in | Wt 128.6 lb

## 2019-11-28 DIAGNOSIS — D125 Benign neoplasm of sigmoid colon: Secondary | ICD-10-CM

## 2019-11-28 DIAGNOSIS — Z1211 Encounter for screening for malignant neoplasm of colon: Secondary | ICD-10-CM | POA: Diagnosis not present

## 2019-11-28 NOTE — Patient Instructions (Signed)
Dr.Piscoya recommends patient to increase exercise such as walking. Advised repeat Colonoscopy in August. Referral sent to [facility]. Dr.Piscoya discussed with patient she may have her follow up mammograms with PCP.   Breast Self-Awareness Breast self-awareness means being familiar with how your breasts look and feel. It involves checking your breasts regularly and reporting any changes to your health care provider. Practicing breast self-awareness is important. Sometimes changes may not be harmful (are benign), but sometimes a change in your breasts can be a sign of a serious medical problem. It is important to learn how to do this procedure correctly so that you can catch problems early, when treatment is more likely to be successful. All women should practice breast self-awareness, including women who have had breast implants. What you need:  A mirror.  A well-lit room. How to do a breast self-exam A breast self-exam is one way to learn what is normal for your breasts and whether your breasts are changing. To do a breast self-exam: Look for changes  1. Remove all the clothing above your waist. 2. Stand in front of a mirror in a room with good lighting. 3. Put your hands on your hips. 4. Push your hands firmly downward. 5. Compare your breasts in the mirror. Look for differences between them (asymmetry), such as: ? Differences in shape. ? Differences in size. ? Puckers, dips, and bumps in one breast and not the other. 6. Look at each breast for changes in the skin, such as: ? Redness. ? Scaly areas. 7. Look for changes in your nipples, such as: ? Discharge. ? Bleeding. ? Dimpling. ? Redness. ? A change in position. Feel for changes Carefully feel your breasts for lumps and changes. It is best to do this while lying on your back on the floor, and again while sitting or standing in the tub or shower with soapy water on your skin. Feel each breast in the following  way: 1. Place the arm on the side of the breast you are examining above your head. 2. Feel your breast with the other hand. 3. Start in the nipple area and make -inch (2 cm) overlapping circles to feel your breast. Use the pads of your three middle fingers to do this. Apply light pressure, then medium pressure, then firm pressure. The light pressure will allow you to feel the tissue closest to the skin. The medium pressure will allow you to feel the tissue that is a little deeper. The firm pressure will allow you to feel the tissue close to the ribs. 4. Continue the overlapping circles, moving downward over the breast until you feel your ribs below your breast. 5. Move one finger-width toward the center of the body. Continue to use the -inch (2 cm) overlapping circles to feel your breast as you move slowly up toward your collarbone. 6. Continue the up-and-down exam using all three pressures until you reach your armpit.  Write down what you find Writing down what you find can help you remember what to discuss with your health care provider. Write down:  What is normal for each breast.  Any changes that you find in each breast, including: ? The kind of changes you find. ? Any pain or tenderness. ? Size and location of any lumps.  Where you are in your menstrual cycle, if you are still menstruating. General tips and recommendations  Examine your breasts every month.  If you are breastfeeding, the best time to examine your breasts is after a  feeding or after using a breast pump.  If you menstruate, the best time to examine your breasts is 5-7 days after your period. Breasts are generally lumpier during menstrual periods, and it may be more difficult to notice changes.  With time and practice, you will become more familiar with the variations in your breasts and more comfortable with the exam. Contact a health care provider if you:  See a change in the shape or size of your breasts or  nipples.  See a change in the skin of your breast or nipples, such as a reddened or scaly area.  Have unusual discharge from your nipples.  Find a lump or thick area that was not there before.  Have pain in your breasts.  Have any concerns related to your breast health. Summary  Breast self-awareness includes looking for physical changes in your breasts, as well as feeling for any changes within your breasts.  Breast self-awareness should be performed in front of a mirror in a well-lit room.  You should examine your breasts every month. If you menstruate, the best time to examine your breasts is 5-7 days after your menstrual period.  Let your health care provider know of any changes you notice in your breasts, including changes in size, changes on the skin, pain or tenderness, or unusual fluid from your nipples. This information is not intended to replace advice given to you by your health care provider. Make sure you discuss any questions you have with your health care provider. Document Revised: 01/05/2018 Document Reviewed: 01/05/2018 Elsevier Patient Education  [place].

## 2019-11-28 NOTE — Progress Notes (Signed)
11/28/2019  History of Present Illness: [name] is a 74 y.o. female s/p laparoscopic sigmoidectomy for a tubulovillous adenoma in 01/2016 with Dr. Jamal Collin.  She has also been followed by both Dr. Jamal Collin and Dr. Bary Castilla for screening mammograms.  She had her last mammogram on 11/21/19.  She denies any abdominal pain, diarrhea, constipation, blood in the stool.  She denies any breast masses, skin changes, or nipple drainage.  Past Medical History: Past Medical History:  Diagnosis Date  . Anemia   . Arthritis    hands and knees  . Colon polyp 01/30/2016   Tubulovillous adenoma  . Diabetes mellitus without complication ([place]) 3532  . Diabetic eye exam ([place]) 09/19/2019   Performed at [facility].. Background diabetic retinopathy & diabetic macular edema, follow up one with Dr. Roosevelt Locks  . Hypertension   . Wears dentures    full upper and lower     Past Surgical History: Past Surgical History:  Procedure Laterality Date  . ANTERIOR APPROACH HEMI HIP ARTHROPLASTY Right 07/04/2018   [facility]  . CATARACT EXTRACTION W/PHACO Right 08/05/2016   Procedure: CATARACT EXTRACTION PHACO AND INTRAOCULAR LENS PLACEMENT (IOC);  Surgeon: Birder Robson, MD;  Location: [facility] ORS;  Service: Ophthalmology;  Laterality: Right;  [place] 39.5 AP% 21.4 CDE 8.45 Fluid pack lot # [id number]  . CATARACT EXTRACTION W/PHACO Left 02/01/2019   Procedure: CATARACT EXTRACTION PHACO AND INTRAOCULAR LENS PLACEMENT (IOC) LEFT DIABETIC  2:00.2  15.0%  18.58;  Surgeon: Birder Robson, MD;  Location: [facility];  Service: Ophthalmology;  Laterality: Left;  Diabeetic - oral meds  . COLONOSCOPY WITH PROPOFOL N/A 01/09/2016   Procedure: COLONOSCOPY WITH PROPOFOL;  Surgeon: Lollie Sails, MD;  Location: [facility] ENDOSCOPY;  Service: Endoscopy;  Laterality: N/A;  . COLONOSCOPY WITH PROPOFOL N/A 01/28/2017   Procedure: COLONOSCOPY WITH PROPOFOL;  Surgeon: Christene Lye, MD;  Location: [facility] ENDOSCOPY;  Service:  Endoscopy;  Laterality: N/A;  . LAPAROSCOPIC SIGMOID COLECTOMY N/A 01/30/2016   Tubulovillous adenoma    Home Medications: Prior to Admission medications   Medication Sig Start Date End Date Taking? Authorizing Provider  Accu-Chek Softclix Lancets lancets CHECK BLOOD SUGAR EVERY DAY 11/07/19  Yes Masoud, Viann Shove, MD  acetaminophen (TYLENOL) 500 MG tablet Take 500 mg by mouth every 6 (six) hours as needed for moderate pain or headache.   Yes [provider]  alendronate (FOSAMAX) 70 MG tablet Take 70 mg by mouth once a week. Take with a full glass of water on an empty stomach.   Yes [provider]  amLODipine (NORVASC) 5 MG tablet Take 5 mg by mouth daily.    Yes [provider]  atorvastatin (LIPITOR) 10 MG tablet Take 10 mg by mouth daily.   Yes [provider]  Calcium Carbonate (CALCIUM 600 PO) Take by mouth daily.   Yes [provider]  Carboxymethylcell-Hypromellose (GENTEAL OP) Apply to eye 2 (two) times daily.   Yes [provider]  Cholecalciferol (VITAMIN D3) 50 MCG (2000 UT) capsule Take 2,000 Units by mouth daily.   Yes [provider]  hydrochlorothiazide (HYDRODIURIL) 25 MG tablet Take 25 mg by mouth daily.   Yes [provider]  metFORMIN (GLUCOPHAGE) 500 MG tablet Take 1,000 mg by mouth 2 (two) times daily with a meal.    Yes [provider]  Multiple Vitamin (MULTIVITAMIN WITH MINERALS) TABS tablet Take 1 tablet by mouth daily.   Yes [provider]  vitamin B-12 (CYANOCOBALAMIN) 1000 MCG tablet Take 1,000  mcg by mouth daily.   Yes [provider]  ACCU-CHEK AVIVA PLUS test strip  09/07/19   [provider]    Allergies: Allergies  Allergen Reactions  . Oxycodone Nausea And Vomiting    Review of Systems: Review of Systems  Constitutional: Negative for chills and fever.  Respiratory: Negative for shortness of breath.   Cardiovascular: Negative for chest pain.   Gastrointestinal: Negative for abdominal pain, constipation, diarrhea, nausea and vomiting.  Skin: Negative for rash.    Physical Exam BP (!) 178/73   Pulse (!) 118   Temp (!) 97.3 F (36.3 C) (Temporal)   Ht 5\' 3"  (1.6 m)   Wt 128 lb 9.6 oz (58.3 kg)   SpO2 96%   BMI 22.78 kg/m  CONSTITUTIONAL: No acute distress HEENT:  Normocephalic, atraumatic, extraocular motion intact. RESPIRATORY:  Lungs are clear, and breath sounds are equal bilaterally. Normal respiratory effort without pathologic use of accessory muscles. CARDIOVASCULAR: Heart is regular without murmurs, gallops, or rubs. BREAST:  Right breast without any palpable masses, skin changes, or nipple changes.  No right axillary lymphadenopathy.  Left breast without any palpable masses, skin changes, or nipple changes.  No left axillary lymphadenopathy. GI: The abdomen is soft, non-distended, non-tender.  No palpable hernias.  Incisions well healed.  PSYCH:  Alert and oriented to person, place and time. Affect is normal.  Labs/Imaging: Mammogram 11/21/19: FINDINGS: There are no findings suspicious for malignancy. Images were processed with CAD.  IMPRESSION: No mammographic evidence of malignancy. A result letter of this screening mammogram will be mailed directly to the patient.  RECOMMENDATION: Screening mammogram in one year. (Code:[id number]-B-01Y)  BI-RADS CATEGORY  1: Negative.  Assessment and Plan: This is a 74 y.o. female s/p laparoscopic sigmoidectomy in 01/2016, also being followed for screening mammograms.  --Discussed with the patient that her mammogram was negative for any suspicious findings and her exam was also reassuring.  I think it's appropriate going forwards to defer further mammograms and exams to her PCP.  She is in agreement with this. --She had her last colonoscopy in 12/2016 and is due for new colonoscopy in August 2021.  Will send a referral to GI for colonoscopy in the future. --May follow up with [place]  as needed.  Face-to-face time spent with the patient and care providers was 15 minutes, with more than 50% of the time spent counseling, educating, and coordinating care of the patient.     Melvyn Neth, [facility]

## 2019-12-06 DIAGNOSIS — E11311 Type 2 diabetes mellitus with unspecified diabetic retinopathy with macular edema: Secondary | ICD-10-CM | POA: Diagnosis not present

## 2019-12-06 DIAGNOSIS — E113312 Type 2 diabetes mellitus with moderate nonproliferative diabetic retinopathy with macular edema, left eye: Secondary | ICD-10-CM | POA: Diagnosis not present

## 2019-12-09 ENCOUNTER — Other Ambulatory Visit: Payer: Self-pay

## 2019-12-09 ENCOUNTER — Telehealth (INDEPENDENT_AMBULATORY_CARE_PROVIDER_SITE_OTHER): Payer: Self-pay | Admitting: Gastroenterology

## 2019-12-09 DIAGNOSIS — Z800 Family history of malignant neoplasm of digestive organs: Secondary | ICD-10-CM

## 2019-12-09 DIAGNOSIS — Z86010 Personal history of colonic polyps: Secondary | ICD-10-CM

## 2019-12-09 NOTE — Progress Notes (Signed)
Gastroenterology Pre-Procedure Review  Request Date: Friday 12/30/19 Requesting Physician: Dr. Bonna Gains  PATIENT REVIEW QUESTIONS: The patient responded to the following health history questions as indicated:    1. Are you having any GI issues? no 2. Do you have a personal history of Polyps? yes (Aug 9th, 2017 Colonoscopy performed by Dr. Gustavo Lah notes polyps) 3. Do you have a family history of Colon Cancer or Polyps? yes (sister had colon cancer) 4. Diabetes Mellitus? no 5. Joint replacements in the past 12 months?no 6. Major health problems in the past 3 months?no 7. Any artificial heart valves, MVP, or defibrillator?no    MEDICATIONS & ALLERGIES:    Patient reports the following regarding taking any anticoagulation/antiplatelet therapy:   Plavix, Coumadin, Eliquis, Xarelto, Lovenox, Pradaxa, Brilinta, or Effient? no Aspirin? no  Patient confirms/reports the following medications:  Current Outpatient Medications  Medication Sig Dispense Refill  . ACCU-CHEK AVIVA PLUS test strip     . Accu-Chek Softclix Lancets lancets CHECK BLOOD SUGAR EVERY DAY 100 each 6  . acetaminophen (TYLENOL) 500 MG tablet Take 500 mg by mouth every 6 (six) hours as needed for moderate pain or headache.    . alendronate (FOSAMAX) 70 MG tablet Take 70 mg by mouth once a week. Take with a full glass of water on an empty stomach.    Marland Kitchen amLODipine (NORVASC) 5 MG tablet Take 5 mg by mouth daily.     Marland Kitchen atorvastatin (LIPITOR) 10 MG tablet Take 10 mg by mouth daily.    . Calcium Carbonate (CALCIUM 600 PO) Take by mouth daily.    . Carboxymethylcell-Hypromellose (GENTEAL OP) Apply to eye 2 (two) times daily.    . Cholecalciferol (VITAMIN D3) 50 MCG (2000 UT) capsule Take 2,000 Units by mouth daily.    . hydrochlorothiazide (HYDRODIURIL) 25 MG tablet Take 25 mg by mouth daily.    . metFORMIN (GLUCOPHAGE) 500 MG tablet Take 1,000 mg by mouth 2 (two) times daily with a meal.     . Multiple Vitamin (MULTIVITAMIN WITH  MINERALS) TABS tablet Take 1 tablet by mouth daily.    . vitamin B-12 (CYANOCOBALAMIN) 1000 MCG tablet Take 1,000 mcg by mouth daily.     No current facility-administered medications for this visit.    Patient confirms/reports the following allergies:  Allergies  Allergen Reactions  . Oxycodone Nausea And Vomiting    No orders of the defined types were placed in this encounter.   AUTHORIZATION INFORMATION Primary Insurance: 1D#: Group #:  Secondary Insurance: 1D#: Group #:  SCHEDULE INFORMATION: Date: Friday 12/30/19 Time: Location:[facility]

## 2019-12-22 ENCOUNTER — Other Ambulatory Visit: Payer: Self-pay | Admitting: Internal Medicine

## 2019-12-27 DIAGNOSIS — E113211 Type 2 diabetes mellitus with mild nonproliferative diabetic retinopathy with macular edema, right eye: Secondary | ICD-10-CM | POA: Diagnosis not present

## 2019-12-28 ENCOUNTER — Other Ambulatory Visit: Payer: Self-pay

## 2019-12-28 ENCOUNTER — Other Ambulatory Visit
Admission: RE | Admit: 2019-12-28 | Discharge: 2019-12-28 | Disposition: A | Discharge status: Home / Self Care | Payer: Medicare HMO | Source: Ambulatory Visit | Attending: Gastroenterology | Admitting: Gastroenterology

## 2019-12-28 DIAGNOSIS — Z01812 Encounter for preprocedural laboratory examination: Secondary | ICD-10-CM | POA: Diagnosis not present

## 2019-12-28 DIAGNOSIS — Z20822 Contact with and (suspected) exposure to covid-19: Secondary | ICD-10-CM | POA: Diagnosis not present

## 2019-12-28 LAB — SARS CORONAVIRUS 2 (TAT 6-24 HRS): SARS Coronavirus 2: NEGATIVE

## 2019-12-30 ENCOUNTER — Encounter: Payer: Self-pay | Admitting: Gastroenterology

## 2019-12-30 ENCOUNTER — Ambulatory Visit: Payer: Medicare HMO | Admitting: Certified Registered"

## 2019-12-30 ENCOUNTER — Encounter
Admission: RE | Disposition: A | Discharge status: Home / Self Care | Payer: Self-pay | Source: Home / Self Care | Attending: Gastroenterology

## 2019-12-30 ENCOUNTER — Ambulatory Visit
Admission: RE | Admit: 2019-12-30 | Discharge: 2019-12-30 | Disposition: A | Discharge status: Home / Self Care | Payer: Medicare HMO | Attending: Gastroenterology | Admitting: Gastroenterology

## 2019-12-30 ENCOUNTER — Other Ambulatory Visit: Payer: Self-pay

## 2019-12-30 DIAGNOSIS — Z980 Intestinal bypass and anastomosis status: Secondary | ICD-10-CM | POA: Insufficient documentation

## 2019-12-30 DIAGNOSIS — Z79899 Other long term (current) drug therapy: Secondary | ICD-10-CM | POA: Insufficient documentation

## 2019-12-30 DIAGNOSIS — Z1211 Encounter for screening for malignant neoplasm of colon: Secondary | ICD-10-CM | POA: Insufficient documentation

## 2019-12-30 DIAGNOSIS — K635 Polyp of colon: Secondary | ICD-10-CM | POA: Diagnosis not present

## 2019-12-30 DIAGNOSIS — Z800 Family history of malignant neoplasm of digestive organs: Secondary | ICD-10-CM | POA: Diagnosis not present

## 2019-12-30 DIAGNOSIS — Z860100 Personal history of colon polyps, unspecified: Secondary | ICD-10-CM

## 2019-12-30 DIAGNOSIS — E119 Type 2 diabetes mellitus without complications: Secondary | ICD-10-CM | POA: Insufficient documentation

## 2019-12-30 DIAGNOSIS — I10 Essential (primary) hypertension: Secondary | ICD-10-CM | POA: Diagnosis not present

## 2019-12-30 DIAGNOSIS — D125 Benign neoplasm of sigmoid colon: Secondary | ICD-10-CM | POA: Diagnosis not present

## 2019-12-30 DIAGNOSIS — Z7983 Long term (current) use of bisphosphonates: Secondary | ICD-10-CM | POA: Insufficient documentation

## 2019-12-30 DIAGNOSIS — Z7984 Long term (current) use of oral hypoglycemic drugs: Secondary | ICD-10-CM | POA: Diagnosis not present

## 2019-12-30 HISTORY — PX: COLONOSCOPY WITH PROPOFOL: SHX5780

## 2019-12-30 LAB — GLUCOSE, CAPILLARY: Glucose-Capillary: 153 mg/dL — ABNORMAL HIGH (ref 70–99)

## 2019-12-30 SURGERY — COLONOSCOPY WITH PROPOFOL
Anesthesia: General

## 2019-12-30 MED ORDER — PROPOFOL 10 MG/ML IV BOLUS
INTRAVENOUS | Status: AC
  Filled 2019-12-30: qty 20

## 2019-12-30 MED ORDER — LIDOCAINE HCL (PF) 2 % IJ SOLN
INTRAMUSCULAR | Status: AC
  Filled 2019-12-30: qty 5

## 2019-12-30 MED ORDER — SODIUM CHLORIDE 0.9 % IV SOLN: INTRAVENOUS | Status: DC

## 2019-12-30 MED ORDER — PROPOFOL 500 MG/50ML IV EMUL
INTRAVENOUS | Status: DC | PRN
  Administered 2019-12-30: 99 ug/kg/min via INTRAVENOUS

## 2019-12-30 MED ORDER — LIDOCAINE HCL (CARDIAC) PF 100 MG/5ML IV SOSY
PREFILLED_SYRINGE | INTRAVENOUS | Status: DC | PRN
  Administered 2019-12-30: 100 mg via INTRAVENOUS

## 2019-12-30 MED ORDER — PROPOFOL 500 MG/50ML IV EMUL
INTRAVENOUS | Status: AC
  Filled 2019-12-30: qty 50

## 2019-12-30 NOTE — H&P (Signed)
Hannah Antigua, MD [address], [place], [place], [place], [address] [address], [place], [place], [place], [address] Phone: [phone]  Fax: [phone]  Primary Care Physician:  Cletis Athens, MD   Pre-Procedure History & Physical: HPI:  [name] is a 74 y.o. female is here for a colonoscopy.   Past Medical History:  Diagnosis Date  . Anemia   . Arthritis    hands and knees  . Colon polyp 01/30/2016   Tubulovillous adenoma  . Diabetes mellitus without complication ([place]) 1638  . Diabetic eye exam ([place]) 09/19/2019   Performed at [facility].. Background diabetic retinopathy & diabetic macular edema, follow up one with Dr. Roosevelt Locks  . Hypertension   . Wears dentures    full upper and lower    Past Surgical History:  Procedure Laterality Date  . ANTERIOR APPROACH HEMI HIP ARTHROPLASTY Right 07/04/2018   [facility]  . CATARACT EXTRACTION W/PHACO Right 08/05/2016   Procedure: CATARACT EXTRACTION PHACO AND INTRAOCULAR LENS PLACEMENT (IOC);  Surgeon: Birder Robson, MD;  Location: [facility] ORS;  Service: Ophthalmology;  Laterality: Right;  [place] 39.5 AP% 21.4 CDE 8.45 Fluid pack lot # [id number]  . CATARACT EXTRACTION W/PHACO Left 02/01/2019   Procedure: CATARACT EXTRACTION PHACO AND INTRAOCULAR LENS PLACEMENT (IOC) LEFT DIABETIC  2:00.2  15.0%  18.58;  Surgeon: Birder Robson, MD;  Location: [facility];  Service: Ophthalmology;  Laterality: Left;  Diabeetic - oral meds  . COLONOSCOPY WITH PROPOFOL N/A 01/09/2016   Procedure: COLONOSCOPY WITH PROPOFOL;  Surgeon: Lollie Sails, MD;  Location: [facility] ENDOSCOPY;  Service: Endoscopy;  Laterality: N/A;  . COLONOSCOPY WITH PROPOFOL N/A 01/28/2017   Procedure: COLONOSCOPY WITH PROPOFOL;  Surgeon: Christene Lye, MD;  Location: [facility] ENDOSCOPY;  Service: Endoscopy;  Laterality: N/A;  . LAPAROSCOPIC SIGMOID COLECTOMY N/A 01/30/2016   Tubulovillous adenoma    Prior to Admission medications   Medication Sig  Start Date End Date Taking? Authorizing Provider  ACCU-CHEK AVIVA PLUS test strip  09/07/19   [provider]  Accu-Chek Softclix Lancets lancets CHECK BLOOD SUGAR EVERY DAY 11/07/19   Cletis Athens, MD  acetaminophen (TYLENOL) 500 MG tablet Take 500 mg by mouth every 6 (six) hours as needed for moderate pain or headache.    [provider]  alendronate (FOSAMAX) 70 MG tablet Take 70 mg by mouth once a week. Take with a full glass of water on an empty stomach.    [provider]  amLODipine (NORVASC) 5 MG tablet Take 5 mg by mouth daily.     [provider]  atorvastatin (LIPITOR) 10 MG tablet TAKE 1 TABLET EVERY DAY 12/22/19   Cletis Athens, MD  Calcium Carbonate (CALCIUM 600 PO) Take by mouth daily.    [provider]  Carboxymethylcell-Hypromellose (GENTEAL OP) Apply to eye 2 (two) times daily.    [provider]  Cholecalciferol (VITAMIN D3) 50 MCG (2000 UT) capsule Take 2,000 Units by mouth daily.    [provider]  hydrochlorothiazide (HYDRODIURIL) 25 MG tablet Take 25 mg by mouth daily.    [provider]  metFORMIN (GLUCOPHAGE) 500 MG tablet Take 1,000 mg by mouth 2 (two) times daily with a meal.     [provider]  Multiple Vitamin (MULTIVITAMIN WITH MINERALS) TABS tablet Take 1 tablet by mouth daily.    [provider]  vitamin B-12 (CYANOCOBALAMIN) 1000 MCG tablet Take 1,000 mcg by mouth daily.    [provider]    Allergies  as of 12/09/2019 - Review Complete 12/09/2019  Allergen Reaction Noted  . Oxycodone Nausea And Vomiting 01/05/2017    Family History  Problem Relation Age of Onset  . Breast cancer Sister        72's  . Colon cancer Sister     Social History   Socioeconomic History  . Marital status: Married    Spouse name: Not on file  . Number of children: Not on file  . Years of education: Not on file  . Highest education level: Not on file  Occupational History  .  Not on file  Tobacco Use  . Smoking status: Never Smoker  . Smokeless tobacco: Never Used  Vaping Use  . Vaping Use: Never used  Substance and Sexual Activity  . Alcohol use: No  . Drug use: No  . Sexual activity: Not on file  Other Topics Concern  . Not on file  Social History Narrative  . Not on file   Social Determinants of Health   Financial Resource Strain:   . Difficulty of Paying Living Expenses:   Food Insecurity:   . Worried About Charity fundraiser in the Last Year:   . Arboriculturist in the Last Year:   Transportation Needs:   . Film/video editor (Medical):   Marland Kitchen Lack of Transportation (Non-Medical):   Physical Activity:   . Days of Exercise per Week:   . Minutes of Exercise per Session:   Stress:   . Feeling of Stress :   Social Connections:   . Frequency of Communication with Friends and Family:   . Frequency of Social Gatherings with Friends and Family:   . Attends Religious Services:   . Active Member of Clubs or Organizations:   . Attends Archivist Meetings:   Marland Kitchen Marital Status:   Intimate Partner Violence:   . Fear of Current or Ex-Partner:   . Emotionally Abused:   Marland Kitchen Physically Abused:   . Sexually Abused:     Review of Systems: See HPI, otherwise negative ROS  Physical Exam: There were no vitals taken for this visit. General:   Alert,  pleasant and cooperative in NAD Head:  Normocephalic and atraumatic. Neck:  Supple; no masses or thyromegaly. Lungs:  Clear throughout to auscultation, normal respiratory effort.    Heart:  +S1, +S2, Regular rate and rhythm, No edema. Abdomen:  Soft, nontender and nondistended. Normal bowel sounds, without guarding, and without rebound.   Neurologic:  Alert and  oriented x4;  grossly normal neurologically.  Impression/Plan: Lenon Oms is here for a colonoscopy to be performed for history of polyps.  Risks, benefits, limitations, and alternatives regarding  colonoscopy have been reviewed  with the patient.  Questions have been answered.  All parties agreeable.   Virgel Manifold, MD  12/30/2019, 8:01 AM

## 2019-12-30 NOTE — Op Note (Signed)
[facility] Gastroenterology Patient Name: [name] Procedure Date: 12/30/2019 9:10 AM MRN: [id number] Account #: [id number] Date of Birth: [birth year] Admit Type: Outpatient Age: 74 Room: [facility] ENDO ROOM 4 Gender: Female Note Status: Finalized Procedure:             Colonoscopy Indications:           Screening in patient at increased risk: Family history                         of 1st-degree relative with colorectal cancer before                         age 47 years, High risk colon cancer surveillance:                         Personal history of colonic polyps Providers:             Alsace Dowd B. Bonna Gains MD, MD Referring MD:          Cletis Athens, MD (Referring MD) Medicines:             Monitored Anesthesia Care Complications:         No immediate complications. Procedure:             Pre-Anesthesia Assessment:                        - ASA Grade Assessment: II - A patient with mild                         systemic disease.                        - Prior to the procedure, a History and Physical was                         performed, and patient medications, allergies and                         sensitivities were reviewed. The patient's tolerance                         of previous anesthesia was reviewed.                        - The risks and benefits of the procedure and the                         sedation options and risks were discussed with the                         patient. All questions were answered and informed                         consent was obtained.                        - Patient identification and proposed procedure were                         verified prior  to the procedure by the physician, the                         nurse, the anesthesiologist, the anesthetist and the                         technician. The procedure was verified in the                         procedure room.                        After obtaining informed  consent, the colonoscope was                         passed under direct vision. Throughout the procedure,                         the patient's blood pressure, pulse, and oxygen                         saturations were monitored continuously. The                         Colonoscope was introduced through the anus and                         advanced to the the cecum, identified by appendiceal                         orifice and ileocecal valve. The colonoscopy was                         performed with ease. The patient tolerated the                         procedure well. The quality of the bowel preparation                         was good. Findings:      The perianal and digital rectal examinations were normal.      Two sessile polyps were found in the descending colon. The polyps were 2       to 3 mm in size. These polyps were removed with a cold biopsy forceps.       Resection and retrieval were complete.      A 6 mm polyp was found in the sigmoid colon. The polyp was flat. The       polyp was removed with a cold snare. Resection and retrieval were       complete.      There was evidence of a prior surgical anastomosis in the sigmoid colon.       This was patent and was characterized by healthy appearing mucosa.      The exam was otherwise without abnormality.      The rectum, sigmoid colon, descending colon, transverse colon, ascending       colon and cecum appeared normal.      The retroflexed view of the distal rectum and anal verge was normal and  showed no anal or rectal abnormalities. Impression:            - Two 2 to 3 mm polyps in the descending colon,                         removed with a cold biopsy forceps. Resected and                         retrieved.                        - One 6 mm polyp in the sigmoid colon, removed with a                         cold snare. Resected and retrieved.                        - Patent surgical anastomosis, characterized by                          healthy appearing mucosa.                        - The examination was otherwise normal.                        - The rectum, sigmoid colon, descending colon,                         transverse colon, ascending colon and cecum are normal.                        - The distal rectum and anal verge are normal on                         retroflexion view. Recommendation:        - Discharge patient to home (with escort).                        - Advance diet as tolerated.                        - Continue present medications.                        - Await pathology results.                        - Repeat colonoscopy in 3 years for surveillance.                        - The findings and recommendations were discussed with                         the patient.                        - The findings and recommendations were discussed with                         the patient's family.                        -  Return to primary care physician as previously                         scheduled. Procedure Code(s):     --- Professional ---                        [phone], Colonoscopy, flexible; with removal of                         tumor(s), polyp(s), or other lesion(s) by snare                         technique                        45380, 18, Colonoscopy, flexible; with biopsy, single                         or multiple Diagnosis Code(s):     --- Professional ---                        K63.5, Polyp of colon                        Z80.0, Family history of malignant neoplasm of                         digestive organs                        Z86.010, Personal history of colonic polyps                        Z98.0, Intestinal bypass and anastomosis status CPT copyright 2019 American Medical Association. All rights reserved. The codes documented in this report are preliminary and upon coder review may  be revised to meet current compliance requirements.  [name], MD Margretta Sidle  B. Bonna Gains MD, MD 12/30/2019 9:49:19 AM This report has been signed electronically. Number of Addenda: 0 Note Initiated On: 12/30/2019 9:10 AM Scope Withdrawal Time: 0 hours 21 minutes 9 seconds  Total Procedure Duration: 0 hours 26 minutes 28 seconds  Estimated Blood Loss:  Estimated blood loss: none.      [facility]

## 2019-12-30 NOTE — Transfer of Care (Signed)
Immediate Anesthesia Transfer of Care Note  Patient: [name]  Procedure(s) Performed: COLONOSCOPY WITH PROPOFOL (N/A )  Patient Location: PACU and Endoscopy Unit  Anesthesia Type:General  Level of Consciousness: sedated  Airway & Oxygen Therapy: Patient Spontanous Breathing and Patient connected to nasal cannula oxygen  Post-op Assessment: Report given to RN and Post -op Vital signs reviewed and stable  Post vital signs: Reviewed and stable  Last Vitals:  Vitals Value Taken Time  BP 97/50 12/30/19 0946  Temp    Pulse 93 12/30/19 0947  Resp 22 12/30/19 0947  SpO2 100 % 12/30/19 0947  Vitals shown include unvalidated device data.  Last Pain:  Vitals:   12/30/19 0818  TempSrc: Temporal         Complications: No complications documented.

## 2019-12-30 NOTE — Anesthesia Preprocedure Evaluation (Addendum)
Anesthesia Evaluation  Patient identified by MRN, date of birth, ID band Patient awake    Reviewed: Allergy & Precautions, H&P , NPO status , Patient's Chart, lab work & pertinent test results  Airway Mallampati: II  TM Distance: >3 FB Neck ROM: full    Dental  (+) Upper Dentures, Lower Dentures   Pulmonary neg pulmonary ROS,    breath sounds clear to auscultation       Cardiovascular hypertension,  Rhythm:regular Rate:Normal     Neuro/Psych negative neurological ROS  negative psych ROS   GI/Hepatic negative GI ROS, Neg liver ROS,   Endo/Other  diabetes  Renal/GU negative Renal ROS  negative genitourinary   Musculoskeletal  (+) Arthritis , Rheumatoid disorders,    Abdominal   Peds  Hematology  (+) Blood dyscrasia, anemia ,   Anesthesia Other Findings Past Medical History: No date: Anemia No date: Arthritis     Comment:  hands and knees 01/30/2016: Colon polyp     Comment:  Tubulovillous adenoma 2016: Diabetes mellitus without complication ([place]) 14/48/1856: Diabetic eye exam ([place])     Comment:  Performed at [facility].. Background diabetic               retinopathy & diabetic macular edema, follow up one with               Dr. Roosevelt Locks No date: Hypertension No date: Wears dentures     Comment:  full upper and lower  Past Surgical History: 07/04/2018: ANTERIOR APPROACH HEMI HIP ARTHROPLASTY; Right     Comment:  [facility] 08/05/2016: CATARACT EXTRACTION W/PHACO; Right     Comment:  Procedure: CATARACT EXTRACTION PHACO AND INTRAOCULAR               LENS PLACEMENT (IOC);  Surgeon: Birder Robson, MD;                Location: [facility] ORS;  Service: Ophthalmology;  Laterality:              Right;  [place] 39.5 AP% 21.4 CDE 8.45 Fluid pack lot #               [id number] 02/01/2019: CATARACT EXTRACTION W/PHACO; Left     Comment:  Procedure: CATARACT EXTRACTION PHACO AND INTRAOCULAR               LENS PLACEMENT (IOC)  LEFT DIABETIC  2:00.2  15.0%  18.58;              Surgeon: Birder Robson, MD;  Location: [facility];  Service: Ophthalmology;  Laterality: Left;                Diabeetic - oral meds 01/09/2016: COLONOSCOPY WITH PROPOFOL; N/A     Comment:  Procedure: COLONOSCOPY WITH PROPOFOL;  Surgeon: Lollie Sails, MD;  Location: [facility] ENDOSCOPY;  Service:               Endoscopy;  Laterality: N/A; 01/28/2017: COLONOSCOPY WITH PROPOFOL; N/A     Comment:  Procedure: COLONOSCOPY WITH PROPOFOL;  Surgeon: Christene Lye, MD;  Location: [facility] ENDOSCOPY;  Service:               Endoscopy;  Laterality: N/A; 01/30/2016: LAPAROSCOPIC SIGMOID COLECTOMY; N/A  Comment:  Tubulovillous adenoma     Reproductive/Obstetrics negative OB ROS                           Anesthesia Physical Anesthesia Plan  ASA: III  Anesthesia Plan: General   Post-op Pain Management:    Induction:   PONV Risk Score and Plan: Propofol infusion and TIVA  Airway Management Planned: Nasal Cannula  Additional Equipment:   Intra-op Plan:   Post-operative Plan:   Informed Consent: I have reviewed the patients History and Physical, chart, labs and discussed the procedure including the risks, benefits and alternatives for the proposed anesthesia with the patient or authorized representative who has indicated his/her understanding and acceptance.     Dental Advisory Given  Plan Discussed with: Anesthesiologist, CRNA and Surgeon  Anesthesia Plan Comments:         Anesthesia Quick Evaluation

## 2019-12-31 NOTE — Anesthesia Postprocedure Evaluation (Signed)
Anesthesia Post Note  Patient: [name]  Procedure(s) Performed: COLONOSCOPY WITH PROPOFOL (N/A )  Patient location during evaluation: PACU Anesthesia Type: General Level of consciousness: awake and alert Pain management: pain level controlled Vital Signs Assessment: post-procedure vital signs reviewed and stable Respiratory status: spontaneous breathing, nonlabored ventilation and respiratory function stable Cardiovascular status: blood pressure returned to baseline and stable Postop Assessment: no apparent nausea or vomiting Anesthetic complications: no   No complications documented.   Last Vitals:  Vitals:   12/30/19 1003 12/30/19 1013  BP: (!) 117/63 124/73  Pulse:    Resp:    Temp:    SpO2:      Last Pain:  Vitals:   12/31/19 0755  TempSrc:   PainSc: 0-No pain                 Tera Mater

## 2020-01-02 LAB — SURGICAL PATHOLOGY

## 2020-01-05 ENCOUNTER — Encounter: Payer: Self-pay | Admitting: Gastroenterology

## 2020-02-02 ENCOUNTER — Other Ambulatory Visit: Payer: Self-pay | Admitting: Internal Medicine

## 2020-02-07 DIAGNOSIS — E113211 Type 2 diabetes mellitus with mild nonproliferative diabetic retinopathy with macular edema, right eye: Secondary | ICD-10-CM | POA: Diagnosis not present

## 2020-03-15 DIAGNOSIS — H2000 Unspecified acute and subacute iridocyclitis: Secondary | ICD-10-CM | POA: Diagnosis not present

## 2020-03-22 DIAGNOSIS — H2000 Unspecified acute and subacute iridocyclitis: Secondary | ICD-10-CM | POA: Diagnosis not present

## 2020-03-22 DIAGNOSIS — E113211 Type 2 diabetes mellitus with mild nonproliferative diabetic retinopathy with macular edema, right eye: Secondary | ICD-10-CM | POA: Diagnosis not present

## 2020-03-27 DIAGNOSIS — E113211 Type 2 diabetes mellitus with mild nonproliferative diabetic retinopathy with macular edema, right eye: Secondary | ICD-10-CM | POA: Diagnosis not present

## 2020-03-27 DIAGNOSIS — H2000 Unspecified acute and subacute iridocyclitis: Secondary | ICD-10-CM | POA: Diagnosis not present

## 2020-05-08 DIAGNOSIS — H16003 Unspecified corneal ulcer, bilateral: Secondary | ICD-10-CM | POA: Diagnosis not present

## 2020-05-08 DIAGNOSIS — E113211 Type 2 diabetes mellitus with mild nonproliferative diabetic retinopathy with macular edema, right eye: Secondary | ICD-10-CM | POA: Diagnosis not present

## 2020-05-09 DIAGNOSIS — E113213 Type 2 diabetes mellitus with mild nonproliferative diabetic retinopathy with macular edema, bilateral: Secondary | ICD-10-CM | POA: Diagnosis not present

## 2020-05-09 DIAGNOSIS — H18893 Other specified disorders of cornea, bilateral: Secondary | ICD-10-CM | POA: Diagnosis not present

## 2020-05-09 DIAGNOSIS — H209 Unspecified iridocyclitis: Secondary | ICD-10-CM | POA: Diagnosis not present

## 2020-06-13 DIAGNOSIS — H40003 Preglaucoma, unspecified, bilateral: Secondary | ICD-10-CM | POA: Diagnosis not present

## 2020-06-19 ENCOUNTER — Other Ambulatory Visit: Payer: Self-pay | Admitting: Internal Medicine

## 2020-06-25 ENCOUNTER — Other Ambulatory Visit: Payer: Self-pay | Admitting: Internal Medicine

## 2020-07-30 DIAGNOSIS — E113211 Type 2 diabetes mellitus with mild nonproliferative diabetic retinopathy with macular edema, right eye: Secondary | ICD-10-CM | POA: Diagnosis not present

## 2020-08-02 ENCOUNTER — Other Ambulatory Visit: Payer: Self-pay | Admitting: Internal Medicine

## 2020-10-01 ENCOUNTER — Ambulatory Visit: Payer: Medicare HMO | Admitting: Internal Medicine

## 2020-10-02 ENCOUNTER — Encounter: Payer: Self-pay | Admitting: Internal Medicine

## 2020-10-02 ENCOUNTER — Ambulatory Visit (INDEPENDENT_AMBULATORY_CARE_PROVIDER_SITE_OTHER): Payer: Medicare HMO | Admitting: Internal Medicine

## 2020-10-02 ENCOUNTER — Other Ambulatory Visit: Payer: Self-pay

## 2020-10-02 VITALS — BP 144/84 | HR 97 | Ht 63.0 in | Wt 120.9 lb

## 2020-10-02 DIAGNOSIS — D125 Benign neoplasm of sigmoid colon: Secondary | ICD-10-CM

## 2020-10-02 DIAGNOSIS — S72001D Fracture of unspecified part of neck of right femur, subsequent encounter for closed fracture with routine healing: Secondary | ICD-10-CM

## 2020-10-02 DIAGNOSIS — E1143 Type 2 diabetes mellitus with diabetic autonomic (poly)neuropathy: Secondary | ICD-10-CM

## 2020-10-02 DIAGNOSIS — I10 Essential (primary) hypertension: Secondary | ICD-10-CM | POA: Diagnosis not present

## 2020-10-02 DIAGNOSIS — W19XXXD Unspecified fall, subsequent encounter: Secondary | ICD-10-CM

## 2020-10-02 MED ORDER — POTASSIUM CHLORIDE CRYS ER 20 MEQ PO TBCR: 20.0000 meq | EXTENDED_RELEASE_TABLET | Freq: Two times a day (BID) | ORAL | 4 refills | Status: AC

## 2020-10-02 NOTE — Assessment & Plan Note (Signed)
Gait is stable without any imbalance.

## 2020-10-02 NOTE — Assessment & Plan Note (Signed)
Stable at present time without any pain.

## 2020-10-02 NOTE — Assessment & Plan Note (Signed)
Patient was advised to follow-up with a GI surgeon on a regular basis

## 2020-10-02 NOTE — Assessment & Plan Note (Signed)
Patient blood pressure is normal patient denies any chest pain or shortness of breath there is no history of palpitation or paroxysmal nocturnal dyspnea   patient was advised to follow low-salt low-cholesterol diet    ideally I want to keep systolic blood pressure below 130 mmHg, patient was asked to check blood pressure one times a week and give me a report on that.  Patient will be follow-up in 3 months  or earlier as needed, patient will call me back for any change in the cardiovascular symptoms    

## 2020-10-02 NOTE — Progress Notes (Signed)
Established Patient Office Visit  Subjective:  Patient ID: [name], female    DOB: [birth year]  Age: 75 y.o. MRN: [id number]  CC:  Chief Complaint  Patient presents with  . Follow-up    HPI  [name] presents for general checkup.  Past Medical History:  Diagnosis Date  . Anemia   . Arthritis    hands and knees  . Colon polyp 01/30/2016   Tubulovillous adenoma  . Diabetes mellitus without complication ([place]) 2229  . Diabetic eye exam ([place]) 09/19/2019   Performed at [facility].. Background diabetic retinopathy & diabetic macular edema, follow up one with Dr. Roosevelt Locks  . Hypertension   . Wears dentures    full upper and lower    Past Surgical History:  Procedure Laterality Date  . ANTERIOR APPROACH HEMI HIP ARTHROPLASTY Right 07/04/2018   [facility]  . CATARACT EXTRACTION W/PHACO Right 08/05/2016   Procedure: CATARACT EXTRACTION PHACO AND INTRAOCULAR LENS PLACEMENT (IOC);  Surgeon: Birder Robson, MD;  Location: [facility] ORS;  Service: Ophthalmology;  Laterality: Right;  [place] 39.5 AP% 21.4 CDE 8.45 Fluid pack lot # [id number]  . CATARACT EXTRACTION W/PHACO Left 02/01/2019   Procedure: CATARACT EXTRACTION PHACO AND INTRAOCULAR LENS PLACEMENT (IOC) LEFT DIABETIC  2:00.2  15.0%  18.58;  Surgeon: Birder Robson, MD;  Location: [place];  Service: Ophthalmology;  Laterality: Left;  Diabeetic - oral meds  . COLONOSCOPY WITH PROPOFOL N/A 01/09/2016   Procedure: COLONOSCOPY WITH PROPOFOL;  Surgeon: Lollie Sails, MD;  Location[facility] ENDOSCOPY;  Service: Endoscopy;  Laterality: N/A;  . COLONOSCOPY WITH PROPOFOL N/A 01/28/2017   Procedure: COLONOSCOPY WITH PROPOFOL;  Surgeon: Christene Lye, MD;  Location: [facility] ENDOSCOPY;  Service: Endoscopy;  Laterality: N/A;  . COLONOSCOPY WITH PROPOFOL N/A 12/30/2019   Procedure: COLONOSCOPY WITH PROPOFOL;  Surgeon: Virgel Manifold, MD;  Location: [facility] ENDOSCOPY;  Service: Endoscopy;  Laterality: N/A;  .  LAPAROSCOPIC SIGMOID COLECTOMY N/A 01/30/2016   Tubulovillous adenoma    Family History  Problem Relation Age of Onset  . Breast cancer Sister        36's  . Colon cancer Sister     Social History   Socioeconomic History  . Marital status: Married    Spouse name: Not on file  . Number of children: Not on file  . Years of education: Not on file  . Highest education level: Not on file  Occupational History  . Not on file  Tobacco Use  . Smoking status: Never Smoker  . Smokeless tobacco: Never Used  Vaping Use  . Vaping Use: Never used  Substance and Sexual Activity  . Alcohol use: No  . Drug use: No  . Sexual activity: Not on file  Other Topics Concern  . Not on file  Social History Narrative  . Not on file   Social Determinants of Health   Financial Resource Strain: Not on file  Food Insecurity: Not on file  Transportation Needs: Not on file  Physical Activity: Not on file  Stress: Not on file  Social Connections: Not on file  Intimate Partner Violence: Not on file     Current Outpatient Medications:  .  Accu-Chek Softclix Lancets lancets, CHECK BLOOD SUGAR EVERY DAY, Disp: 100 each, Rfl: 6 .  acetaminophen (TYLENOL) 500 MG tablet, Take 500 mg by mouth every 6 (six) hours as needed for moderate pain or headache., Disp: , Rfl:  .  alendronate (FOSAMAX) 70 MG tablet, Take 1 tablet  by mouth once a week, Disp: 12 tablet, Rfl: 0 .  amLODipine (NORVASC) 5 MG tablet, TAKE 1 TABLET EVERY DAY, Disp: 90 tablet, Rfl: 3 .  atorvastatin (LIPITOR) 10 MG tablet, TAKE 1 TABLET EVERY DAY, Disp: 90 tablet, Rfl: 3 .  Calcium Carbonate (CALCIUM 600 PO), Take by mouth daily., Disp: , Rfl:  .  Carboxymethylcell-Hypromellose (GENTEAL OP), Apply to eye 2 (two) times daily., Disp: , Rfl:  .  Cholecalciferol (VITAMIN D3) 50 MCG (2000 UT) capsule, Take 2,000 Units by mouth daily., Disp: , Rfl:  .  glucose blood (ACCU-CHEK AVIVA PLUS) test strip, USE  ONE  STRIP  DAILY, Disp: 100 strip,  Rfl: 6 .  hydrochlorothiazide (HYDRODIURIL) 25 MG tablet, TAKE 1 TABLET EVERY DAY, Disp: 90 tablet, Rfl: 3 .  metFORMIN (GLUCOPHAGE) 500 MG tablet, TAKE 4 TABLETS EVERY DAY., Disp: 360 tablet, Rfl: 3 .  Multiple Vitamin (MULTIVITAMIN WITH MINERALS) TABS tablet, Take 1 tablet by mouth daily., Disp: , Rfl:  .  Polyethyl Glycol-Propyl Glycol (SYSTANE) 0.4-0.3 % SOLN, Apply to eye., Disp: , Rfl:  .  vitamin B-12 (CYANOCOBALAMIN) 1000 MCG tablet, Take 1,000 mcg by mouth daily., Disp: , Rfl:    Allergies  Allergen Reactions  . Oxycodone Nausea And Vomiting    ROS Review of Systems  Constitutional: Negative.   HENT: Negative.   Eyes: Negative.   Respiratory: Negative.   Cardiovascular: Negative.   Gastrointestinal: Negative.   Endocrine: Negative.   Genitourinary: Negative.   Musculoskeletal: Negative.   Skin: Negative.   Allergic/Immunologic: Negative.   Neurological: Negative.   Hematological: Negative.   Psychiatric/Behavioral: Negative.   All other systems reviewed and are negative.     Objective:    Physical Exam Vitals reviewed.  Constitutional:      Appearance: Normal appearance.  HENT:     Mouth/Throat:     Mouth: Mucous membranes are moist.  Eyes:     Pupils: Pupils are equal, round, and reactive to light.  Neck:     Vascular: No carotid bruit.  Cardiovascular:     Rate and Rhythm: Normal rate and regular rhythm.     Pulses: Normal pulses.     Heart sounds: Normal heart sounds.  Pulmonary:     Effort: Pulmonary effort is normal.     Breath sounds: Normal breath sounds.  Abdominal:     General: Bowel sounds are normal.     Palpations: Abdomen is soft. There is no hepatomegaly, splenomegaly or mass.     Tenderness: There is no abdominal tenderness.     Hernia: No hernia is present.  Musculoskeletal:        General: No tenderness.     Cervical back: Neck supple.     Right lower leg: No edema.     Left lower leg: No edema.  Skin:    Findings: No rash.   Neurological:     Mental Status: She is alert and oriented to person, place, and time.     Motor: No weakness.  Psychiatric:        Mood and Affect: Mood and affect normal.        Behavior: Behavior normal.     BP (!) 144/84   Pulse 97   Ht 5\' 3"  (1.6 m)   Wt 120 lb 14.4 oz (54.8 kg)   BMI 21.42 kg/m  Wt Readings from Last 3 Encounters:  10/02/20 120 lb 14.4 oz (54.8 kg)  12/30/19 128 lb (58.1 kg)  11/28/19 128 lb  9.6 oz (58.3 kg)     Health Maintenance Due  Topic Date Due  . Hepatitis C Screening  Never done  . COVID-19 Vaccine (1) Never done  . FOOT EXAM  Never done  . URINE MICROALBUMIN  Never done  . TETANUS/TDAP  Never done  . DEXA SCAN  Never done  . PNA vac Low Risk Adult (2 of 2 - PCV13) 01/31/2017  . HEMOGLOBIN A1C  05/30/2017    There are no preventive care reminders to display for this patient.  No results found for: TSH Lab Results  Component Value Date   WBC 14.1 (H) 02/01/2016   HGB 9.4 (L) 02/01/2016   HCT 26.6 (L) 02/01/2016   MCV 78.8 (L) 02/01/2016   PLT 181 02/01/2016   Lab Results  Component Value Date   NA 135 01/31/2016   K 3.4 (L) 02/01/2016   CO2 23 01/31/2016   GLUCOSE 112 (H) 01/31/2016   BUN 16 01/31/2016   CREATININE 0.85 01/31/2016   CALCIUM 8.5 (L) 01/31/2016   ANIONGAP 7 01/31/2016   No results found for: CHOL No results found for: HDL No results found for: LDLCALC No results found for: TRIG No results found for: CHOLHDL No results found for: HGBA1C    Assessment & Plan:   Problem List Items Addressed This Visit      Cardiovascular and Mediastinum   Benign essential hypertension - Primary    Patient blood pressure is normal patient denies any chest pain or shortness of breath there is no history of palpitation or paroxysmal nocturnal dyspnea   patient was advised to follow low-salt low-cholesterol diet    ideally I want to keep systolic blood pressure below 130 mmHg, patient was asked to check blood pressure one  times a week and give me a report on that.  Patient will be follow-up in 3 months  or earlier as needed, patient will call me back for any change in the cardiovascular symptoms           Digestive   Benign neoplasm of sigmoid colon    Patient was advised to follow-up with a GI surgeon on a regular basis        Endocrine   Diabetes [facility])     Musculoskeletal and Integument   Fracture of right hip requiring operative repair ([place])    Stable at present time without any pain.        Other   Fall    Gait is stable without any imbalance.       Hypokalemia/we will start k dur 20 meq  daily  No orders of the defined types were placed in this encounter.   Follow-up: No follow-ups on file.    Cletis Athens, MD

## 2020-10-04 ENCOUNTER — Other Ambulatory Visit: Payer: Self-pay | Admitting: *Deleted

## 2020-10-04 MED ORDER — ALENDRONATE SODIUM 70 MG PO TABS: 70.0000 mg | ORAL_TABLET | ORAL | 0 refills | Status: DC

## 2020-10-04 MED ORDER — ATORVASTATIN CALCIUM 10 MG PO TABS: 1.0000 | ORAL_TABLET | Freq: Every day | ORAL | 3 refills | Status: DC

## 2020-10-04 MED ORDER — METFORMIN HCL 500 MG PO TABS: ORAL_TABLET | ORAL | 3 refills | Status: DC

## 2020-10-31 ENCOUNTER — Other Ambulatory Visit: Payer: Self-pay | Admitting: Internal Medicine

## 2020-10-31 DIAGNOSIS — Z1231 Encounter for screening mammogram for malignant neoplasm of breast: Secondary | ICD-10-CM

## 2020-11-13 DIAGNOSIS — E113211 Type 2 diabetes mellitus with mild nonproliferative diabetic retinopathy with macular edema, right eye: Secondary | ICD-10-CM | POA: Diagnosis not present

## 2020-11-21 ENCOUNTER — Ambulatory Visit
Admission: RE | Admit: 2020-11-21 | Discharge: 2020-11-21 | Disposition: A | Discharge status: Home / Self Care | Payer: Medicare HMO | Source: Ambulatory Visit | Attending: Internal Medicine | Admitting: Internal Medicine

## 2020-11-21 ENCOUNTER — Other Ambulatory Visit: Payer: Self-pay

## 2020-11-21 DIAGNOSIS — Z1231 Encounter for screening mammogram for malignant neoplasm of breast: Secondary | ICD-10-CM | POA: Insufficient documentation

## 2020-12-04 DIAGNOSIS — E113211 Type 2 diabetes mellitus with mild nonproliferative diabetic retinopathy with macular edema, right eye: Secondary | ICD-10-CM | POA: Diagnosis not present

## 2020-12-12 DIAGNOSIS — H40003 Preglaucoma, unspecified, bilateral: Secondary | ICD-10-CM | POA: Diagnosis not present

## 2021-01-02 ENCOUNTER — Encounter: Payer: Medicare HMO | Admitting: Internal Medicine

## 2021-02-05 DIAGNOSIS — E113211 Type 2 diabetes mellitus with mild nonproliferative diabetic retinopathy with macular edema, right eye: Secondary | ICD-10-CM | POA: Diagnosis not present

## 2021-02-26 ENCOUNTER — Other Ambulatory Visit: Payer: Self-pay | Admitting: Internal Medicine

## 2021-03-25 ENCOUNTER — Other Ambulatory Visit: Payer: Self-pay | Admitting: *Deleted

## 2021-03-25 MED ORDER — ALENDRONATE SODIUM 70 MG PO TABS: 70.0000 mg | ORAL_TABLET | ORAL | 0 refills | Status: DC

## 2021-03-26 LAB — HM DIABETES EYE EXAM

## 2021-03-29 ENCOUNTER — Ambulatory Visit: Payer: Medicare HMO

## 2021-04-02 DIAGNOSIS — E113211 Type 2 diabetes mellitus with mild nonproliferative diabetic retinopathy with macular edema, right eye: Secondary | ICD-10-CM | POA: Diagnosis not present

## 2021-04-05 ENCOUNTER — Ambulatory Visit (INDEPENDENT_AMBULATORY_CARE_PROVIDER_SITE_OTHER): Payer: Medicare HMO

## 2021-04-05 DIAGNOSIS — Z0000 Encounter for general adult medical examination without abnormal findings: Secondary | ICD-10-CM

## 2021-04-05 NOTE — Progress Notes (Signed)
Subjective:   [name] is a 75 y.o. female who presents for Medicare Annual (Subsequent) preventive examination. I discussed the limitations of evaluation and management by telemedicine and the availability of in person appointments. The patient expressed understanding and agreed to proceed.   Visit performed by audio   Patient location: Home  Provider location: Home   Review of Systems    N/A Cardiac Risk Factors include: none     Objective:    There were no vitals filed for this visit. There is no height or weight on file to calculate BMI.  Advanced Directives 04/05/2021 12/30/2019 02/01/2019 07/13/2018 01/28/2017 08/05/2016 01/30/2016  Does Patient Have a Medical Advance Directive? No No No No No No No  Would patient like information on creating a medical advance directive? - No - Patient declined No - Patient declined No - Patient declined - No - Patient declined No - patient declined information    Current Medications (verified) Outpatient Encounter Medications as of 04/05/2021  Medication Sig   Accu-Chek Softclix Lancets lancets CHECK BLOOD SUGAR EVERY DAY   acetaminophen (TYLENOL) 500 MG tablet Take 500 mg by mouth every 6 (six) hours as needed for moderate pain or headache.   alendronate (FOSAMAX) 70 MG tablet Take 1 tablet (70 mg total) by mouth once a week. Take with a full glass of water on an empty stomach.   amLODipine (NORVASC) 5 MG tablet TAKE 1 TABLET EVERY DAY   atorvastatin (LIPITOR) 10 MG tablet Take 1 tablet (10 mg total) by mouth daily.   Calcium Carbonate (CALCIUM 600 PO) Take by mouth daily.   Cholecalciferol (VITAMIN D3) 50 MCG (2000 UT) capsule Take 2,000 Units by mouth daily.   glucose blood (ACCU-CHEK AVIVA PLUS) test strip TEST BLOOD SUGAR EVERY DAY   hydrochlorothiazide (HYDRODIURIL) 25 MG tablet TAKE 1 TABLET EVERY DAY   metFORMIN (GLUCOPHAGE) 500 MG tablet TAKE 4 TABLETS EVERY DAY.   Multiple Vitamin (MULTIVITAMIN WITH MINERALS) TABS tablet  Take 1 tablet by mouth daily.   Polyethyl Glycol-Propyl Glycol (SYSTANE) 0.4-0.3 % SOLN Apply to eye.   potassium chloride SA (KLOR-CON) 20 MEQ tablet Take 1 tablet (20 mEq total) by mouth 2 (two) times daily.   vitamin B-12 (CYANOCOBALAMIN) 1000 MCG tablet Take 1,000 mcg by mouth daily.   [DISCONTINUED] Carboxymethylcell-Hypromellose (GENTEAL OP) Apply to eye 2 (two) times daily.   No facility-administered encounter medications on file as of 04/05/2021.    Allergies (verified) Oxycodone   History: Past Medical History:  Diagnosis Date   Anemia    Arthritis    hands and knees   Colon polyp 01/30/2016   Tubulovillous adenoma   Diabetes mellitus without complication ([place]) 6144   Diabetic eye exam ([place]) 09/19/2019   Performed at [facility].. Background diabetic retinopathy & diabetic macular edema, follow up one with Dr. Roosevelt Locks   Hypertension    Wears dentures    full upper and lower   Past Surgical History:  Procedure Laterality Date   ANTERIOR APPROACH HEMI HIP ARTHROPLASTY Right 07/04/2018   [facility]   CATARACT EXTRACTION W/PHACO Right 08/05/2016   Procedure: CATARACT EXTRACTION PHACO AND INTRAOCULAR LENS PLACEMENT (IOC);  Surgeon: Birder Robson, MD;  Location: [facility] ORS;  Service: Ophthalmology;  Laterality: Right;  [place] 39.5 AP% 21.4 CDE 8.45 Fluid pack lot # [id number]   CATARACT EXTRACTION W/PHACO Left 02/01/2019   Procedure: CATARACT EXTRACTION PHACO AND INTRAOCULAR LENS PLACEMENT (IOC) LEFT DIABETIC  2:00.2  15.0%  18.58;  Surgeon: George Ina,  Gwyndolyn Saxon, MD;  Location: [facility];  Service: Ophthalmology;  Laterality: Left;  Diabeetic - oral meds   COLONOSCOPY WITH PROPOFOL N/A 01/09/2016   Procedure: COLONOSCOPY WITH PROPOFOL;  Surgeon: Lollie Sails, MD;  Location: [facility] ENDOSCOPY;  Service: Endoscopy;  Laterality: N/A;   COLONOSCOPY WITH PROPOFOL N/A 01/28/2017   Procedure: COLONOSCOPY WITH PROPOFOL;  Surgeon: Christene Lye, MD;  Location: [facility] ENDOSCOPY;   Service: Endoscopy;  Laterality: N/A;   COLONOSCOPY WITH PROPOFOL N/A 12/30/2019   Procedure: COLONOSCOPY WITH PROPOFOL;  Surgeon: Virgel Manifold, MD;  Location: [facility] ENDOSCOPY;  Service: Endoscopy;  Laterality: N/A;   LAPAROSCOPIC SIGMOID COLECTOMY N/A 01/30/2016   Tubulovillous adenoma   Family History  Problem Relation Age of Onset   Breast cancer Sister        14's   Colon cancer Sister    Social History   Socioeconomic History   Marital status: Widowed    Spouse name: Not on file   Number of children: 3   Years of education: Not on file   Highest education level: 12th grade  Occupational History   Occupation: Retired    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  Tobacco Use   Smoking status: Never   Smokeless tobacco: Never  Vaping Use   Vaping Use: Never used  Substance and Sexual Activity   Alcohol use: No   Drug use: No   Sexual activity: Not Currently  Other Topics Concern   Not on file  Social History Narrative   Not on file   Social Determinants of Health   Financial Resource Strain: Low Risk    Difficulty of Paying Living Expenses: Not very hard  Food Insecurity: No Food Insecurity   Worried About Charity fundraiser in the Last Year: Never true   Ran Out of Food in the Last Year: Never true  Transportation Needs: No Transportation Needs   Lack of Transportation (Medical): No   Lack of Transportation (Non-Medical): No  Physical Activity: Insufficiently Active   Days of Exercise per Week: 4 days   Minutes of Exercise per Session: 20 min  Stress: No Stress Concern Present   Feeling of Stress : Not at all  Social Connections: Moderately Integrated   Frequency of Communication with Friends and Family: More than three times a week   Frequency of Social Gatherings with Friends and Family: More than three times a week   Attends Religious Services: More than 4 times per year   Active Member of [employer] or Organizations: Yes   Attends Theatre manager Meetings: More than 4 times per year   Marital Status: Widowed    Tobacco Counseling Counseling given: Not Answered   Clinical Intake:  Pre-visit preparation completed: Yes  Pain : No/denies pain     Diabetes: Yes  How often do you need to have someone help you when you read instructions, pamphlets, or other written materials from your doctor or pharmacy?: 1 - Never What is the last grade level you completed in school?: 12  Diabetic? Yes  Interpreter Needed?: No  Information entered by :: [place] CMA   Activities of Daily Living In your present state of health, do you have any difficulty performing the following activities: 04/05/2021  Hearing? N  Vision? N  Difficulty concentrating or making decisions? N  Walking or climbing stairs? N  Dressing or bathing? N  Doing errands, shopping? N  Preparing Food and eating ? N  Using the  Toilet? N  In the past six months, have you accidently leaked urine? N  Do you have problems with loss of bowel control? N  Managing your Medications? N  Managing your Finances? N  Housekeeping or managing your Housekeeping? N  Some recent data might be hidden    Patient Care Team: Cletis Athens, MD as PCP - General (Internal Medicine) Faye Ramsay, FNP as Nurse Practitioner (Nurse Practitioner) Christene Lye, MD (General Surgery)  Indicate any recent Medical Services you may have received from other than [facility] providers in the past year (date may be approximate).     Assessment:   This is a routine wellness examination for [place].  Hearing/Vision screen No results found.  Dietary issues and exercise activities discussed: Current Exercise Habits: Home exercise routine, Time (Minutes): 20, Frequency (Times/Week): 4, Weekly Exercise (Minutes/Week): 80, Intensity: Mild, Exercise limited by: None identified   Goals Addressed   None    Depression Screen PHQ 2/9 Scores 04/05/2021  PHQ - 2 Score 0     Fall Risk Fall Risk  04/05/2021 11/28/2019 11/25/2018 11/25/2018  Falls in the past year? 0 1 1 0  Number falls in past yr: 0 0 - -  Injury with Fall? 0 1 - -  Risk for fall due to : No Fall Risks - - -  Follow up Falls evaluation completed - - Falls evaluation completed    [place]:  Any stairs in or around the home? Yes  If so, are there any without handrails? No  Home free of loose throw rugs in walkways, pet beds, electrical cords, etc? Yes  Adequate lighting in your home to reduce risk of falls? Yes   ASSISTIVE DEVICES UTILIZED TO PREVENT FALLS:  Life alert? No  Use of a cane, walker or w/c? No  Grab bars in the bathroom? No  Shower chair or bench in shower? Yes Elevated toilet seat or a handicapped toilet? No   TIMED UP AND GO:  Was the test performed? No .  Length of time to ambulate 10 feet: 0 sec.     Cognitive Function:     6CIT Screen 04/05/2021  What Year? 0 points  What month? 0 points  What time? 0 points  Count back from 20 0 points  Months in reverse 0 points  Repeat phrase 0 points  Total Score 0    Immunizations Immunization History  Administered Date(s) Administered   Influenza-Unspecified 03/02/2018   Moderna Sars-Covid-2 Vaccination 07/27/2019, 08/24/2019, 04/10/2020   Pneumococcal Polysaccharide-23 02/01/2016    TDAP status: Due, Education has been provided regarding the importance of this vaccine. Advised may receive this vaccine at local pharmacy or Health Dept. Aware to provide a copy of the vaccination record if obtained from local pharmacy or Health Dept. Verbalized acceptance and understanding.  Flu Vaccine status: Due, Education has been provided regarding the importance of this vaccine. Advised may receive this vaccine at local pharmacy or Health Dept. Aware to provide a copy of the vaccination record if obtained from local pharmacy or Health Dept. Verbalized acceptance and  understanding.  Pneumococcal vaccine status: Due, Education has been provided regarding the importance of this vaccine. Advised may receive this vaccine at local pharmacy or Health Dept. Aware to provide a copy of the vaccination record if obtained from local pharmacy or Health Dept. Verbalized acceptance and understanding.  Covid-19 vaccine status: Information provided on how to obtain vaccines.   Qualifies for Shingles Vaccine? Yes  Zostavax completed No   Shingrix Completed?: No.    Education has been provided regarding the importance of this vaccine. Patient has been advised to call insurance company to determine out of pocket expense if they have not yet received this vaccine. Advised may also receive vaccine at local pharmacy or Health Dept. Verbalized acceptance and understanding.  Screening Tests Health Maintenance  Topic Date Due   FOOT EXAM  Never done   URINE MICROALBUMIN  Never done   Hepatitis C Screening  Never done   TETANUS/TDAP  Never done   Zoster Vaccines- Shingrix (1 of 2) Never done   DEXA SCAN  Never done   Pneumonia Vaccine 53+ Years old (2 - PCV) 01/31/2017   HEMOGLOBIN A1C  05/30/2017   COVID-19 Vaccine (4 - Booster for Moderna series) 06/05/2020   INFLUENZA VACCINE  12/31/2020   OPHTHALMOLOGY EXAM  03/26/2022   MAMMOGRAM  11/22/2022   COLONOSCOPY (Pts 45-20yrs Insurance coverage will need to be confirmed)  12/30/2022   HPV VACCINES  Aged Out    Health Maintenance  Health Maintenance Due  Topic Date Due   FOOT EXAM  Never done   URINE MICROALBUMIN  Never done   Hepatitis C Screening  Never done   TETANUS/TDAP  Never done   Zoster Vaccines- Shingrix (1 of 2) Never done   DEXA SCAN  Never done   Pneumonia Vaccine 30+ Years old (2 - PCV) 01/31/2017   HEMOGLOBIN A1C  05/30/2017   COVID-19 Vaccine (4 - Booster for Moderna series) 06/05/2020   INFLUENZA VACCINE  12/31/2020    Colorectal cancer screening: Type of screening: Colonoscopy. Completed  12/2019. Repeat every 3 years  Mammogram status: Completed 11/21/2020. Repeat every year    Lung Cancer Screening: (Low Dose CT Chest recommended if Age 59-80 years, 30 pack-year currently smoking OR have quit w/in 15years.) does not qualify.   Lung Cancer Screening Referral: No  Additional Screening:  Hepatitis C Screening: does not qualify; Completed No  Vision Screening: Recommended annual ophthalmology exams for early detection of glaucoma and other disorders of the eye. Is the patient up to date with their annual eye exam?  Yes  Who is the provider or what is the name of the office in which the patient attends annual eye exams? [facility] If pt is not established with a provider, would they like to be referred to a provider to establish care? No .   Dental Screening: Recommended annual dental exams for proper oral hygiene  Community Resource Referral / Chronic Care Management: CRR required this visit?  No   CCM required this visit?  No      Plan:     I have personally reviewed and noted the following in the patient's chart:   Medical and social history Use of alcohol, tobacco or illicit drugs  Current medications and supplements including opioid prescriptions.  Functional ability and status Nutritional status Physical activity Advanced directives List of other physicians Hospitalizations, surgeries, and ER visits in previous 12 months Vitals Screenings to include cognitive, depression, and falls Referrals and appointments  In addition, I have reviewed and discussed with patient certain preventive protocols, quality metrics, and best practice recommendations. A written personalized care plan for preventive services as well as general preventive health recommendations were provided to patient.    Ms. Nimmons , Thank you for taking time to come for your Medicare Wellness Visit. I appreciate your ongoing commitment to your health goals. Please review the  following  plan we discussed and let me know if I can assist you in the future.   These are the goals we discussed:  Goals   None     This is a list of the screening recommended for you and due dates:  Health Maintenance  Topic Date Due   Complete foot exam   Never done   Urine Protein Check  Never done   Hepatitis C Screening: USPSTF Recommendation to screen - Ages 24-79 yo.  Never done   Tetanus Vaccine  Never done   Zoster (Shingles) Vaccine (1 of 2) Never done   DEXA scan (bone density measurement)  Never done   Pneumonia Vaccine (2 - PCV) 01/31/2017   Hemoglobin A1C  05/30/2017   COVID-19 Vaccine (4 - Booster for Moderna series) 06/05/2020   Flu Shot  12/31/2020   Eye exam for diabetics  03/26/2022   Mammogram  11/22/2022   Colon Cancer Screening  12/30/2022   HPV Vaccine  Aged [address] [place], [place]   04/05/2021

## 2021-04-06 NOTE — Progress Notes (Signed)
I have reviewed this visit and agree with the documentation.   

## 2021-04-10 ENCOUNTER — Other Ambulatory Visit: Payer: Self-pay | Admitting: Internal Medicine

## 2021-06-10 DIAGNOSIS — H40003 Preglaucoma, unspecified, bilateral: Secondary | ICD-10-CM | POA: Diagnosis not present

## 2021-06-10 DIAGNOSIS — H524 Presbyopia: Secondary | ICD-10-CM | POA: Diagnosis not present

## 2021-06-18 DIAGNOSIS — E113211 Type 2 diabetes mellitus with mild nonproliferative diabetic retinopathy with macular edema, right eye: Secondary | ICD-10-CM | POA: Diagnosis not present

## 2021-08-19 ENCOUNTER — Other Ambulatory Visit: Payer: Self-pay | Admitting: Internal Medicine

## 2021-08-20 DIAGNOSIS — E113211 Type 2 diabetes mellitus with mild nonproliferative diabetic retinopathy with macular edema, right eye: Secondary | ICD-10-CM | POA: Diagnosis not present

## 2021-09-07 ENCOUNTER — Other Ambulatory Visit: Payer: Self-pay | Admitting: Internal Medicine

## 2021-10-23 ENCOUNTER — Other Ambulatory Visit: Payer: Self-pay | Admitting: Internal Medicine

## 2021-10-23 DIAGNOSIS — Z1231 Encounter for screening mammogram for malignant neoplasm of breast: Secondary | ICD-10-CM

## 2021-11-15 DIAGNOSIS — E113211 Type 2 diabetes mellitus with mild nonproliferative diabetic retinopathy with macular edema, right eye: Secondary | ICD-10-CM | POA: Diagnosis not present

## 2021-11-22 ENCOUNTER — Ambulatory Visit
Admission: RE | Admit: 2021-11-22 | Discharge: 2021-11-22 | Disposition: A | Discharge status: Home / Self Care | Payer: Medicare HMO | Source: Ambulatory Visit | Attending: Internal Medicine | Admitting: Internal Medicine

## 2021-11-22 DIAGNOSIS — Z1231 Encounter for screening mammogram for malignant neoplasm of breast: Secondary | ICD-10-CM | POA: Insufficient documentation

## 2022-01-13 DIAGNOSIS — E113313 Type 2 diabetes mellitus with moderate nonproliferative diabetic retinopathy with macular edema, bilateral: Secondary | ICD-10-CM | POA: Diagnosis not present

## 2022-01-13 LAB — HM DIABETES EYE EXAM

## 2022-02-04 ENCOUNTER — Encounter: Payer: Self-pay | Admitting: *Deleted

## 2022-02-17 DIAGNOSIS — H18893 Other specified disorders of cornea, bilateral: Secondary | ICD-10-CM | POA: Diagnosis not present

## 2022-02-17 DIAGNOSIS — E113292 Type 2 diabetes mellitus with mild nonproliferative diabetic retinopathy without macular edema, left eye: Secondary | ICD-10-CM | POA: Diagnosis not present

## 2022-02-17 DIAGNOSIS — E113211 Type 2 diabetes mellitus with mild nonproliferative diabetic retinopathy with macular edema, right eye: Secondary | ICD-10-CM | POA: Diagnosis not present

## 2022-02-17 DIAGNOSIS — Z961 Presence of intraocular lens: Secondary | ICD-10-CM | POA: Diagnosis not present

## 2022-02-17 DIAGNOSIS — M069 Rheumatoid arthritis, unspecified: Secondary | ICD-10-CM | POA: Diagnosis not present

## 2022-02-23 ENCOUNTER — Other Ambulatory Visit: Payer: Self-pay | Admitting: Internal Medicine

## 2022-03-10 DIAGNOSIS — E113211 Type 2 diabetes mellitus with mild nonproliferative diabetic retinopathy with macular edema, right eye: Secondary | ICD-10-CM | POA: Diagnosis not present

## 2022-03-31 ENCOUNTER — Other Ambulatory Visit: Payer: Self-pay | Admitting: Internal Medicine

## 2022-05-20 DIAGNOSIS — E113211 Type 2 diabetes mellitus with mild nonproliferative diabetic retinopathy with macular edema, right eye: Secondary | ICD-10-CM | POA: Diagnosis not present

## 2022-06-10 ENCOUNTER — Other Ambulatory Visit: Payer: Self-pay

## 2022-06-10 DIAGNOSIS — E1143 Type 2 diabetes mellitus with diabetic autonomic (poly)neuropathy: Secondary | ICD-10-CM

## 2022-06-10 MED ORDER — ACCU-CHEK GUIDE ME W/DEVICE KIT: 1.0000 | PACK | Freq: Four times a day (QID) | 0 refills | Status: AC | PRN

## 2022-06-23 ENCOUNTER — Other Ambulatory Visit: Payer: Self-pay | Admitting: Internal Medicine

## 2022-07-15 DIAGNOSIS — E113211 Type 2 diabetes mellitus with mild nonproliferative diabetic retinopathy with macular edema, right eye: Secondary | ICD-10-CM | POA: Diagnosis not present

## 2022-09-11 ENCOUNTER — Ambulatory Visit: Payer: Medicare PPO | Admitting: Nurse Practitioner

## 2022-09-11 ENCOUNTER — Encounter: Payer: Self-pay | Admitting: Nurse Practitioner

## 2022-09-11 VITALS — BP 132/84 | HR 107 | Temp 97.9°F | Ht 63.0 in | Wt 126.6 lb

## 2022-09-11 DIAGNOSIS — I10 Essential (primary) hypertension: Secondary | ICD-10-CM

## 2022-09-11 DIAGNOSIS — E1143 Type 2 diabetes mellitus with diabetic autonomic (poly)neuropathy: Secondary | ICD-10-CM

## 2022-09-11 DIAGNOSIS — D649 Anemia, unspecified: Secondary | ICD-10-CM | POA: Diagnosis not present

## 2022-09-11 DIAGNOSIS — S91301A Unspecified open wound, right foot, initial encounter: Secondary | ICD-10-CM

## 2022-09-11 DIAGNOSIS — Z7984 Long term (current) use of oral hypoglycemic drugs: Secondary | ICD-10-CM

## 2022-09-11 DIAGNOSIS — M1990 Unspecified osteoarthritis, unspecified site: Secondary | ICD-10-CM

## 2022-09-11 LAB — CBC WITH DIFFERENTIAL/PLATELET
Basophils Absolute: 0.1 10*3/uL (ref 0.0–0.1)
Basophils Relative: 1 % (ref 0.0–3.0)
Eosinophils Absolute: 0 10*3/uL (ref 0.0–0.7)
Eosinophils Relative: 0.2 % (ref 0.0–5.0)
HCT: 35.6 % — ABNORMAL LOW (ref 36.0–46.0)
Hemoglobin: 11.8 g/dL — ABNORMAL LOW (ref 12.0–15.0)
Lymphocytes Relative: 19.4 % (ref 12.0–46.0)
Lymphs Abs: 1.4 10*3/uL (ref 0.7–4.0)
MCHC: 33.2 g/dL (ref 30.0–36.0)
MCV: 80.5 fl (ref 78.0–100.0)
Monocytes Absolute: 0.3 10*3/uL (ref 0.1–1.0)
Monocytes Relative: 4 % (ref 3.0–12.0)
Neutro Abs: 5.6 10*3/uL (ref 1.4–7.7)
Neutrophils Relative %: 75.4 % (ref 43.0–77.0)
Platelets: 259 10*3/uL (ref 150.0–400.0)
RBC: 4.42 Mil/uL (ref 3.87–5.11)
RDW: 12.6 % (ref 11.5–15.5)
WBC: 7.4 10*3/uL (ref 4.0–10.5)

## 2022-09-11 LAB — COMPREHENSIVE METABOLIC PANEL
ALT: 34 U/L (ref 0–35)
AST: 30 U/L (ref 0–37)
Albumin: 4.2 g/dL (ref 3.5–5.2)
Alkaline Phosphatase: 69 U/L (ref 39–117)
BUN: 22 mg/dL (ref 6–23)
CO2: 27 mEq/L (ref 19–32)
Calcium: 10.2 mg/dL (ref 8.4–10.5)
Chloride: 99 mEq/L (ref 96–112)
Creatinine, Ser: 1.29 mg/dL — ABNORMAL HIGH (ref 0.40–1.20)
GFR: 40.71 mL/min — ABNORMAL LOW (ref >60.00)
Glucose, Bld: 106 mg/dL — ABNORMAL HIGH (ref 70–99)
Potassium: 3.9 mEq/L (ref 3.5–5.1)
Sodium: 138 mEq/L (ref 135–145)
Total Bilirubin: 0.3 mg/dL (ref 0.2–1.2)
Total Protein: 8.9 g/dL — ABNORMAL HIGH (ref 6.0–8.3)

## 2022-09-11 LAB — MICROALBUMIN / CREATININE URINE RATIO
Creatinine,U: 76.9 mg/dL
Microalb Creat Ratio: 13.3 mg/g (ref 0.0–30.0)
Microalb, Ur: 10.2 mg/dL — ABNORMAL HIGH (ref 0.0–1.9)

## 2022-09-11 LAB — LIPID PANEL
Cholesterol: 113 mg/dL (ref 0–200)
HDL: 47.9 mg/dL (ref >39.00)
LDL Cholesterol: 51 mg/dL (ref 0–99)
NonHDL: 65.26
Total CHOL/HDL Ratio: 2
Triglycerides: 73 mg/dL (ref 0.0–149.0)
VLDL: 14.6 mg/dL (ref 0.0–40.0)

## 2022-09-11 LAB — TSH: TSH: 0.78 u[IU]/mL (ref 0.35–5.50)

## 2022-09-11 LAB — HEMOGLOBIN A1C: Hgb A1c MFr Bld: 6 % (ref 4.6–6.5)

## 2022-09-11 MED ORDER — MUPIROCIN 2 % EX OINT: 1.0000 | TOPICAL_OINTMENT | Freq: Two times a day (BID) | CUTANEOUS | 0 refills | Status: DC

## 2022-09-11 MED ORDER — ALENDRONATE SODIUM 70 MG PO TABS: 70.0000 mg | ORAL_TABLET | ORAL | 0 refills | Status: DC

## 2022-09-11 MED ORDER — AMLODIPINE BESYLATE 5 MG PO TABS: 5.0000 mg | ORAL_TABLET | Freq: Every day | ORAL | 2 refills | Status: DC

## 2022-09-11 MED ORDER — HYDROCHLOROTHIAZIDE 25 MG PO TABS: 25.0000 mg | ORAL_TABLET | Freq: Every day | ORAL | 2 refills | Status: DC

## 2022-09-11 NOTE — Progress Notes (Signed)
New Patient Office Visit  Subjective    Patient ID: [name], female    DOB: 05-Dec-1945  Age: 77 y.o. MRN: [id number]  CC:  Chief Complaint  Patient presents with   Establish Care    HPI [name] presents to establish care. Her previous PCP was Dr. Juel Burrow. He has not been to PCP in last 2 years. She lives with her daughter and son.  She has h/o hypertension, arthritis, diabetes and diabetic retinopathy with macular edema right eye.  Diabetes: She check BS at home at the [facility] runs between  90 -110.  She is taking metformin. She has macular edema right eye. She is followed by Dr. Chipper Herb at [facility] and receiving IVT every 8 weeks.   HTN:  She is taking amlodipine 5 mg and HCTZ 25 mg.  Arthritis: Weekly Fosamax and vitamin D3   Health Maintenance  Topic Date Due   FOOT EXAM  Never done   Hepatitis C Screening  Never done   DTaP/Tdap/Td (1 - Tdap) Never done   Zoster Vaccines- Shingrix (1 of 2) Never done   DEXA SCAN  Never done   Pneumonia Vaccine 63+ Years old (2 of 2 - PCV) 01/31/2017   Medicare Annual Wellness (AWV)  04/05/2022   COVID-19 Vaccine (4 - 2023-24 season) 09/27/2022 (Originally 01/31/2022)   COLONOSCOPY (Pts 45-20yrs Insurance coverage will need to be confirmed)  12/30/2022   INFLUENZA VACCINE  01/01/2023   OPHTHALMOLOGY EXAM  01/14/2023   HEMOGLOBIN A1C  03/13/2023   Diabetic kidney evaluation - eGFR measurement  09/11/2023   Diabetic kidney evaluation - Urine ACR  09/11/2023   HPV VACCINES  Aged Out    There are no preventive care reminders to display for this patient.  Outpatient Encounter Medications as of 09/11/2022  Medication Sig   ACCU-CHEK AVIVA PLUS test strip TEST BLOOD SUGAR EVERY DAY   Accu-Chek Softclix Lancets lancets CHECK BLOOD SUGAR EVERY DAY   acetaminophen (TYLENOL) 500 MG tablet Take 500 mg by mouth every 6 (six) hours as needed for moderate pain or headache.   atorvastatin (LIPITOR) 10 MG tablet TAKE 1 TABLET  EVERY DAY   Blood Glucose Monitoring Suppl (ACCU-CHEK GUIDE ME) w/Device KIT 1 Device by Does not apply route 4 (four) times daily as needed.   Calcium Carbonate (CALCIUM 600 PO) Take by mouth daily.   Cholecalciferol (VITAMIN D3) 50 MCG (2000 UT) capsule Take 2,000 Units by mouth daily.   metFORMIN (GLUCOPHAGE) 500 MG tablet TAKE 4 TABLETS EVERY DAY.   Multiple Vitamin (MULTIVITAMIN WITH MINERALS) TABS tablet Take 1 tablet by mouth daily.   mupirocin ointment (BACTROBAN) 2 % Apply 1 Application topically 2 (two) times daily.   Polyethyl Glycol-Propyl Glycol (SYSTANE) 0.4-0.3 % SOLN Apply to eye.   potassium chloride SA (KLOR-CON) 20 MEQ tablet Take 1 tablet (20 mEq total) by mouth 2 (two) times daily.   vitamin B-12 (CYANOCOBALAMIN) 1000 MCG tablet Take 1,000 mcg by mouth daily.   [DISCONTINUED] alendronate (FOSAMAX) 70 MG tablet TAKE 1 TABLET BY MOUTH ONCE A WEEK WITH A FULL GLASS OF WATER ON AN EMPTY STOMACH   [DISCONTINUED] amLODipine (NORVASC) 5 MG tablet TAKE 1 TABLET EVERY DAY   [DISCONTINUED] hydrochlorothiazide (HYDRODIURIL) 25 MG tablet TAKE 1 TABLET EVERY DAY   alendronate (FOSAMAX) 70 MG tablet Take 1 tablet (70 mg total) by mouth once a week. Take with a full glass of water on an empty stomach.   amLODipine (NORVASC) 5  MG tablet Take 1 tablet (5 mg total) by mouth daily.   hydrochlorothiazide (HYDRODIURIL) 25 MG tablet Take 1 tablet (25 mg total) by mouth daily.   No facility-administered encounter medications on file as of 09/11/2022.    Past Medical History:  Diagnosis Date   Anemia    Arthritis    hands and knees   Colon polyp 01/30/2016   Tubulovillous adenoma   Diabetes mellitus without complication 2016   Diabetic eye exam 09/19/2019   Performed at [facility].. Background diabetic retinopathy & diabetic macular edema, follow up one with Dr. Chipper Herb   Hypertension    Wears dentures    full upper and lower    Past Surgical History:  Procedure Laterality Date    ANTERIOR APPROACH HEMI HIP ARTHROPLASTY Right 07/04/2018   [facility]   CATARACT EXTRACTION W/PHACO Right 08/05/2016   Procedure: CATARACT EXTRACTION PHACO AND INTRAOCULAR LENS PLACEMENT (IOC);  Surgeon: Galen Manila, MD;  Location: [facility] ORS;  Service: Ophthalmology;  Laterality: Right;  [place] 39.5 AP% 21.4 CDE 8.45 Fluid pack lot # [id number]   CATARACT EXTRACTION W/PHACO Left 02/01/2019   Procedure: CATARACT EXTRACTION PHACO AND INTRAOCULAR LENS PLACEMENT (IOC) LEFT DIABETIC  2:00.2  15.0%  18.58;  Surgeon: Galen Manila, MD;  Location: [facility];  Service: Ophthalmology;  Laterality: Left;  Diabeetic - oral meds   COLONOSCOPY WITH PROPOFOL N/A 01/09/2016   Procedure: COLONOSCOPY WITH PROPOFOL;  Surgeon: Christena Deem, MD;  Location: [facility] END[facility]COPY;  Service: Endoscopy;  Laterality: N/A;   COLONOSCOPY WITH PROPOFOL N/A 01/28/2017   Procedure: COLONOSCOPY WITH PROPOFOL;  Surgeon: Kieth Brightly, MD;  Location: [facility] ENDOSCOPY;  Service: Endoscopy;  Laterality: N/A;   COLONOSCOPY WITH PROPOFOL N/A 12/30/2019   Procedure: COLONOSCOPY WITH PROPOFOL;  Surgeon: Pasty Spillers, MD;  Location: [facility] ENDOSCOPY;  Service: Endoscopy;  Laterality: N/A;   LAPAROSCOPIC SIGMOID COLECTOMY N/A 01/30/2016   Tubulovillous adenoma    Family History  Problem Relation Age of Onset   Breast cancer Sister        55's   Colon cancer Sister     Social History   Socioeconomic History   Marital status: Widowed    Spouse name: Not on file   Number of children: 3   Years of education: Not on file   Highest education level: 12th grade  Occupational History   Occupation: Retired    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  Tobacco Use   Smoking status: Never   Smokeless tobacco: Never  Vaping Use   Vaping Use: Never used  Substance and Sexual Activity   Alcohol use: No   Drug use: No   Sexual activity: Not Currently  Other Topics Concern   Not on file  Social History  Narrative   Not on file   Social Determinants of Health   Financial Resource Strain: Low Risk  (04/05/2021)   Overall Financial Resource Strain (CARDIA)    Difficulty of Paying Living Expenses: Not very hard  Food Insecurity: No Food Insecurity (04/05/2021)   Hunger Vital Sign    Worried About Running Out of Food in the Last Year: Never true    Ran Out of Food in the Last Year: Never true  Transportation Needs: No Transportation Needs (04/05/2021)   PRAPARE - Administrator, Civil Service (Medical): No    Lack of Transportation (Non-Medical): No  Physical Activity: Insufficiently Active (04/05/2021)   Exercise Vital Sign    Days of  Exercise per Week: 4 days    Minutes of Exercise per Session: 20 min  Stress: No Stress Concern Present (04/05/2021)   [employer] of Occupational Health - Occupational Stress Questionnaire    Feeling of Stress : Not at all  Social Connections: Moderately Integrated (04/05/2021)   Social Connection and Isolation Panel [NHANES]    Frequency of Communication with Friends and Family: More than three times a week    Frequency of Social Gatherings with Friends and Family: More than three times a week    Attends Religious Services: More than 4 times per year    Active Member of [employer] or Organizations: Yes    Attends Banker Meetings: More than 4 times per year    Marital Status: Widowed  Intimate Partner Violence: Not At Risk (04/05/2021)   Humiliation, Afraid, Rape, and Kick questionnaire    Fear of Current or Ex-Partner: No    Emotionally Abused: No    Physically Abused: No    Sexually Abused: No    Review of Systems  Constitutional: Negative.   HENT: Negative.    Eyes:  Positive for blurred vision (right eye).  Respiratory:  Negative for cough and shortness of breath.   Cardiovascular:  Negative for palpitations and leg swelling.  Genitourinary: Negative.   Musculoskeletal: Negative.   Skin: Negative.   Neurological:  Negative.   Psychiatric/Behavioral: Negative.          Objective    BP 132/84   Pulse (!) 107   Temp 97.9 F (36.6 C)   Ht 5\' 3"  (1.6 m)   Wt 126 lb 9.6 oz (57.4 kg)   SpO2 99%   BMI 22.43 kg/m   Physical Exam Constitutional:      Appearance: Normal appearance. She is normal weight.  HENT:     Head: Normocephalic.     Right Ear: Tympanic membrane normal.     Left Ear: Tympanic membrane normal.     Mouth/Throat:     Mouth: Mucous membranes are moist.  Eyes:     Extraocular Movements: Extraocular movements intact.     Conjunctiva/sclera: Conjunctivae normal.     Pupils: Pupils are equal, round, and reactive to light.  Neck:     Thyroid: No thyroid mass or thyroid tenderness.  Cardiovascular:     Rate and Rhythm: Normal rate and regular rhythm.     Pulses: Normal pulses.     Heart sounds: Normal heart sounds. No murmur heard. Pulmonary:     Effort: Pulmonary effort is normal.     Breath sounds: Normal breath sounds.  Abdominal:     General: Bowel sounds are normal.     Palpations: Abdomen is soft. There is no mass.     Tenderness: There is no abdominal tenderness. There is no rebound.  Musculoskeletal:        General: No swelling.     Cervical back: Neck supple. No tenderness.     Right lower leg: No edema.     Left lower leg: No edema.       Feet:  Feet:     Right foot:     Skin integrity: Ulcer present.  Skin:    Findings: No bruising, erythema or rash.  Neurological:     General: No focal deficit present.     Mental Status: She is alert and oriented to person, place, and time. Mental status is at baseline.  Psychiatric:        Mood and Affect: Mood  normal.        Behavior: Behavior normal.        Thought Content: Thought content normal.        Judgment: Judgment normal.         Assessment & Plan:  Type 2 diabetes mellitus with diabetic autonomic neuropathy, without long-term current use of insulin Assessment & Plan: Continue  metformin. Advised patient to continue manage diet. Will check hemoglobin A1c   Orders: -     Microalbumin / creatinine urine ratio -     Hemoglobin A1c  Anemia, unspecified type  Benign essential hypertension Assessment & Plan: Patient BP  Vitals:   09/11/22 1006  BP: 132/84    in the office. Advised pt to follow a low sodium and heart healthy diet. Continue amlodipine and HCTZ daily.  Orders: -     CBC with Differential/Platelet -     Comprehensive metabolic panel -     Lipid panel -     TSH  Wound of right foot Assessment & Plan: Ulceration to right dorsal feet. Referral sent to wound care. Advised patient to keep the wound clean and use Bactroban ointment.   Orders: -     AMB referral to wound care center  Arthritis Assessment & Plan: Continue Fosamax and vitamin D and calcium supplement.  Orders: -     HM DEXA SCAN; Future  Other orders -     Alendronate Sodium; Take 1 tablet (70 mg total) by mouth once a week. Take with a full glass of water on an empty stomach.  Dispense: 30 tablet; Refill: 0 -     amLODIPine Besylate; Take 1 tablet (5 mg total) by mouth daily.  Dispense: 90 tablet; Refill: 2 -     hydroCHLOROthiazide; Take 1 tablet (25 mg total) by mouth daily.  Dispense: 90 tablet; Refill: 2 -     Mupirocin; Apply 1 Application topically 2 (two) times daily.  Dispense: 22 g; Refill: 0    No follow-ups on file.   Kara Dies, NP

## 2022-09-11 NOTE — Patient Instructions (Addendum)
Sent bactroban ointment to pharmacy. Labs ordered. Medication refilled. Referral sent to wound care and Dexa scan.

## 2022-09-21 DIAGNOSIS — S91301A Unspecified open wound, right foot, initial encounter: Secondary | ICD-10-CM | POA: Insufficient documentation

## 2022-09-21 DIAGNOSIS — M1990 Unspecified osteoarthritis, unspecified site: Secondary | ICD-10-CM | POA: Insufficient documentation

## 2022-09-21 NOTE — Assessment & Plan Note (Signed)
Continue Fosamax and vitamin D and calcium supplement.

## 2022-09-21 NOTE — Assessment & Plan Note (Signed)
Patient BP  Vitals:   09/11/22 1006  BP: 132/84    in the office. Advised pt to follow a low sodium and heart healthy diet. Continue amlodipine and HCTZ daily.

## 2022-09-21 NOTE — Assessment & Plan Note (Signed)
Continue metformin. Advised patient to continue manage diet. Will check hemoglobin A1c

## 2022-09-21 NOTE — Assessment & Plan Note (Signed)
Ulceration to right dorsal feet. Referral sent to wound care. Advised patient to keep the wound clean and use Bactroban ointment.

## 2022-09-21 NOTE — Progress Notes (Signed)
Lipids and TSH within normal limit.Hemoglobin A1c well-controlled at 6.0. Hemoglobin 11.8 has anemia take over-the-counter iron supplement and consume iron rich diet. Kidney function on the lower side advised to increase fluid intake.

## 2022-09-29 ENCOUNTER — Encounter: Payer: Medicare PPO | Attending: Physician Assistant | Admitting: Physician Assistant

## 2022-09-29 DIAGNOSIS — E11621 Type 2 diabetes mellitus with foot ulcer: Secondary | ICD-10-CM | POA: Insufficient documentation

## 2022-09-29 DIAGNOSIS — E1151 Type 2 diabetes mellitus with diabetic peripheral angiopathy without gangrene: Secondary | ICD-10-CM | POA: Diagnosis not present

## 2022-09-29 DIAGNOSIS — Z7984 Long term (current) use of oral hypoglycemic drugs: Secondary | ICD-10-CM | POA: Diagnosis not present

## 2022-09-29 DIAGNOSIS — I10 Essential (primary) hypertension: Secondary | ICD-10-CM | POA: Diagnosis not present

## 2022-09-29 DIAGNOSIS — L97512 Non-pressure chronic ulcer of other part of right foot with fat layer exposed: Secondary | ICD-10-CM | POA: Diagnosis not present

## 2022-09-29 DIAGNOSIS — M069 Rheumatoid arthritis, unspecified: Secondary | ICD-10-CM | POA: Diagnosis not present

## 2022-09-30 NOTE — Progress Notes (Signed)
DAVEY, BERGSMA ([id number]) [id number] Page 1 of 8 Visit Report for 09/29/2022 Chief Complaint Document Details Patient Name: Date of Service: [name], [name] 09/29/2022 8:15 A M Medical Record Number: [id number] Patient Account Number: [id number] Date of Birth/Sex: Treating RN: 1946/04/01 (77 y.o. [name] Primary Care Provider: Kara Dies Other Clinician: Referring Provider: Treating Provider/Extender: Claris Gower, Charanpreet Weeks in Treatment: 0 Information Obtained from: Patient Chief Complaint Right foot ulcer Electronic Signature(s) Signed: 09/29/2022 9:12:15 AM By: Allen Derry PA-C Entered By: Allen Derry on 09/29/2022 09:12:15 -------------------------------------------------------------------------------- Debridement Details Patient Name: Date of Service: [name], [name]. 09/29/2022 8:15 A M Medical Record Number: [id number] Patient Account Number: [id number] Date of Birth/Sex: Treating RN: Oct 18, 1945 (77 y.o. [name] Primary Care Provider: Kara Dies Other Clinician: Referring Provider: Treating Provider/Extender: Claris Gower, Charanpreet Weeks in Treatment: 0 Debridement Performed for Assessment: Wound #1 Foot Performed By: Physician Allen Derry, PA-C Debridement Type: Debridement Severity of Tissue Pre Debridement: Fat layer exposed Level of Consciousness (Pre-procedure): Awake and Alert Pre-procedure Verification/Time Out Yes - 09:18 Taken: Pain Control: Lidocaine 4% T opical Solution Percent of Wound Bed Debrided: 100% T Area Debrided (cm): otal 0.71 Tissue and other material debrided: Viable, Non-Viable, Callus, Slough, Subcutaneous, Slough Level: Skin/Subcutaneous Tissue Debridement Description: Excisional Instrument: Curette Bleeding: Moderate Hemostasis Achieved: Pressure Response to Treatment: Procedure was tolerated well Level of Consciousness (Post- Awake and  Alert procedure): Post Debridement Measurements of Total Wound Length: (cm) 1 Width: (cm) 0.9 Depth: (cm) 0.3 Volume: (cm) 0.212 Character of Wound/Ulcer Post Debridement: Stable Severity of Tissue Post Debridement: Fat layer exposed Post Procedure Diagnosis Same as Pre-procedure Electronic Signature(s) Signed: 09/29/2022 4:09:01 PM By: Angelina Pih Signed: 09/29/2022 6:40:35 PM By: Allen Derry PA-C Entered By: Angelina Pih on 09/29/2022 09:23:52 [name], [name] ([id number]) [id number] Page 2 of 8 -------------------------------------------------------------------------------- HPI Details Patient Name: Date of Service: [name], [name]. 09/29/2022 8:15 A M Medical Record Number: [id number] Patient Account Number: [id number] Date of Birth/Sex: Treating RN: 02-25-1946 (77 y.o. [name] Primary Care Provider: Kara Dies Other Clinician: Referring Provider: Treating Provider/Extender: Claris Gower, Charanpreet Weeks in Treatment: 0 History of Present Illness HPI Description: 09-29-2022 upon evaluation today patient appears to be doing poorly currently in regard to her wound on her right foot medially just at the base of her great toe but more plantar aspect. Unfortunately this has been open since around March 18 according to what the patient tells me although she tells me that she had "a bump there before started opening and draining". Fortunately there does not appear to be any signs of systemic infection though locally this does appear to be quite deep just the one concern I have about the potential for osteomyelitis this is something we can have to keep close eye on and fully evaluate. Fortunately there does not appear to be any signs of active infection locally or systemically which is great news. She does have a issue here with the ABIs she was noncompressible today so we were not able to determine an exact blood flow  currently. With that being said she does have seemingly good capillary refill and likely good arterial flow to be able to heal this. Patient does have a history of diabetes mellitus type 2 with a hemoglobin A1c stated to be 6.0 on September 11, 2022. She also has a history of hypertension, peripheral vascular disease, and she is on oral hypoglycemic agents at this point. Electronic Signature(s)  Signed: 10/01/2022 12:31:28 PM By: Allen Derry PA-C Previous Signature: 09/29/2022 6:38:56 PM Version By: Allen Derry PA-C Previous Signature: 09/29/2022 6:36:09 PM Version By: Allen Derry PA-C Entered By: Allen Derry on 10/01/2022 12:31:28 -------------------------------------------------------------------------------- Physical Exam Details Patient Name: Date of Service: [name], [name]. 09/29/2022 8:15 A M Medical Record Number: [id number] Patient Account Number: [id number] Date of Birth/Sex: Treating RN: 07/04/1945 (77 y.o. [name] Primary Care Provider: Kara Dies Other Clinician: Referring Provider: Treating Provider/Extender: Claris Gower, Charanpreet Weeks in Treatment: 0 Constitutional patient is hypertensive.. pulse regular and within target range for patient.Marland Kitchen respirations regular, non-labored and within target range for patient.Marland Kitchen temperature within target range for patient.. Well-nourished and well-hydrated in no acute distress. Eyes conjunctiva clear no eyelid edema noted. pupils equal round and reactive to light and accommodation. Ears, Nose, Mouth, and Throat no gross abnormality of ear auricles or external auditory canals. normal hearing noted during conversation. mucus membranes moist. Respiratory normal breathing without difficulty. Cardiovascular 2+ dorsalis pedis/posterior tibialis pulses. no clubbing, cyanosis, significant edema, <3 sec cap refill. Musculoskeletal normal gait and posture. no significant deformity or arthritic changes, no loss or range of  motion, no clubbing. Psychiatric this patient is able to make decisions and demonstrates good insight into disease process. Alert and Oriented x 3. pleasant and cooperative. Notes Upon inspection patient's wound bed actually showed signs of necrotic tissue and callus that did require sharp debridement today. I did discuss with the patient that we needed to perform debridement she was in agreement with the plan and subsequently we did perform debridement clearway the necrotic debris today which she tolerated quite well and postdebridement the wound bed appears to be doing much better which is great news. I do not see any signs of infection locally nor systemically which is great news. With that being said again she seems to have pretty good arterial flow her pulses are excellent and again with debridement she has good flow as well which is excellent news. Still nonetheless I think that she is going require appropriate offloading [name] to discuss this in greater detail and the plan. Electronic Signature(s) Signed: 10/01/2022 12:32:44 PM By: Allen Derry PA-C Entered By: Allen Derry on 10/01/2022 12:32:44 Kathreen Devoid ([id number]) [id number] Page 3 of 8 -------------------------------------------------------------------------------- Physician Orders Details Patient Name: Date of Service: [name], [name]. 09/29/2022 8:15 A M Medical Record Number: [id number] Patient Account Number: [id number] Date of Birth/Sex: Treating RN: 03-20-1946 (77 y.o. [name] Primary Care Provider: Kara Dies Other Clinician: Referring Provider: Treating Provider/Extender: Claris Gower, Charanpreet Weeks in Treatment: 0 Verbal / Phone Orders: No Diagnosis Coding ICD-10 Coding Code Description E11.621 Type 2 diabetes mellitus with foot ulcer L97.512 Non-pressure chronic ulcer of other part of right foot with fat layer exposed I10 Essential (primary)  hypertension I73.89 Other specified peripheral vascular diseases Z79.84 Long term (current) use of oral hypoglycemic drugs Follow-up Appointments Return Appointment in 1 week. Bathing/ Shower/ Hygiene May shower; gently cleanse wound with antibacterial soap, rinse and pat dry prior to dressing wounds No tub bath. Anesthetic (Use 'Patient Medications' Section for Anesthetic Order Entry) Lidocaine applied to wound bed Off-Loading Other: - please wear tennis shoes at this time Wound Treatment Wound #1 - Foot Cleanser: Byram Ancillary Kit - 15 Day Supply (DME) (Generic) 3 x Per Week/30 Days Discharge Instructions: Use supplies as instructed; Kit contains: (15) Saline Bullets; (15) 3x3 Gauze; 15 pr Gloves Cleanser: Soap and Water 3 x Per Week/30 Days Discharge  Instructions: Gently cleanse wound with antibacterial soap, rinse and pat dry prior to dressing wounds Prim Dressing: Hydrofera Blue Ready Transfer Foam, 2.5x2.5 (in/in) 3 x Per Week/30 Days ary Discharge Instructions: Apply Hydrofera Blue Ready to wound bed as directed Secondary Dressing: (BORDER) Zetuvit Plus SILICONE BORDER Dressing 4x4 (in/in) (DME) (Generic) 3 x Per Week/30 Days Discharge Instructions: Please do not put silicone bordered dressings under wraps. Use non-bordered dressing only. Electronic Signature(s) Signed: 09/29/2022 4:09:01 PM By: Angelina Pih Signed: 09/29/2022 6:40:35 PM By: Allen Derry PA-C Entered By: Angelina Pih on 09/29/2022 10:13:02 -------------------------------------------------------------------------------- Problem List Details Patient Name: Date of Service: [name], [name]. 09/29/2022 8:15 A M Medical Record Number: [id number] Patient Account Number: [id number] Date of Birth/Sex: Treating RN: 01-10-1946 (77 y.o. [name] Primary Care Provider: Kara Dies Other Clinician: Referring Provider: Treating Provider/Extender: Claris Gower, Charanpreet Weeks in Treatment:  0 Active Problems ICD-10 Encounter Code Description Active Date MDM Diagnosis ORTHA, METTS ([id number]) [id number] Page 4 of 8 E11.621 Type 2 diabetes mellitus with foot ulcer 09/29/2022 No Yes L97.512 Non-pressure chronic ulcer of other part of right foot with fat layer exposed 09/29/2022 No Yes I10 Essential (primary) hypertension 09/29/2022 No Yes I73.89 Other specified peripheral vascular diseases 09/29/2022 No Yes Z79.84 Long term (current) use of oral hypoglycemic drugs 09/29/2022 No Yes Inactive Problems Resolved Problems Electronic Signature(s) Signed: 09/29/2022 9:14:27 AM By: Allen Derry PA-C Previous Signature: 09/29/2022 9:11:58 AM Version By: Allen Derry PA-C Entered By: Allen Derry on 09/29/2022 09:14:26 -------------------------------------------------------------------------------- Progress Note Details Patient Name: Date of Service: [name], [name]. 09/29/2022 8:15 A M Medical Record Number: [id number] Patient Account Number: [id number] Date of Birth/Sex: Treating RN: [birth year] (77 y.o. [name] Primary Care Provider: Kara Dies Other Clinician: Referring Provider: Treating Provider/Extender: Claris Gower, Charanpreet Weeks in Treatment: 0 Subjective Chief Complaint Information obtained from Patient Right foot ulcer History of Present Illness (HPI) 09-29-2022 upon evaluation today patient appears to be doing poorly currently in regard to her wound on her right foot medially just at the base of her great toe but more plantar aspect. Unfortunately this has been open since around March 18 according to what the patient tells me although she tells me that she had "a bump there before started opening and draining". Fortunately there does not appear to be any signs of systemic infection though locally this does appear to be quite deep just the one concern I have about the potential for osteomyelitis this is something we  can have to keep close eye on and fully evaluate. Fortunately there does not appear to be any signs of active infection locally or systemically which is great news. She does have a issue here with the ABIs she was noncompressible today so we were not able to determine an exact blood flow currently. With that being said she does have seemingly good capillary refill and likely good arterial flow to be able to heal this. Patient does have a history of diabetes mellitus type 2 with a hemoglobin A1c stated to be 6.0 on September 11, 2022. She also has a history of hypertension, peripheral vascular disease, and she is on oral hypoglycemic agents at this point. Patient History Information obtained from Patient. Allergies oxycodone Social History Never smoker, Alcohol Use - Never, Drug Use - No History, Caffeine Use - Daily - coffee. Medical History Eyes Patient has history of Cataracts - removed Hematologic/Lymphatic Patient has history of Anemia Cardiovascular Patient has history of Hypertension Endocrine  Patient has history of Type II Diabetes [place], [name] ([id number]) [id number] Page 5 of 8 Musculoskeletal Patient has history of Rheumatoid Arthritis Patient is treated with Oral Agents. Blood sugar is tested. Blood sugar results noted at the following times: Breakfast - 104. Review of Systems (ROS) Constitutional Symptoms (General Health) Denies complaints or symptoms of Fatigue, Fever, Chills, Marked Weight Change. Eyes sees  eye doctor for right eye Ear/Nose/Mouth/Throat Denies complaints or symptoms of Difficult clearing ears, Sinusitis. Respiratory Denies complaints or symptoms of Chronic or frequent coughs, Shortness of Breath. Gastrointestinal Denies complaints or symptoms of Frequent diarrhea, Nausea, Vomiting. Genitourinary Denies complaints or symptoms of Kidney failure/ Dialysis, Incontinence/dribbling. Immunological Denies complaints  or symptoms of Hives, Itching. Integumentary (Skin) Denies complaints or symptoms of Wounds, Bleeding or bruising tendency, Breakdown, Swelling. Neurologic Denies complaints or symptoms of Numbness/parasthesias, Focal/Weakness. Objective Constitutional patient is hypertensive.. pulse regular and within target range for patient.Marland Kitchen respirations regular, non-labored and within target range for patient.Marland Kitchen temperature within target range for patient.. Well-nourished and well-hydrated in no acute distress. Vitals Time Taken: 8:25 AM, Height: 63 in, Source: Stated, Weight: 126 lbs, Source: Stated, BMI: 22.3, Temperature: 98 F, Pulse: 100 bpm, Respiratory Rate: 18 breaths/min, Blood Pressure: 165/75 mmHg. Eyes conjunctiva clear no eyelid edema noted. pupils equal round and reactive to light and accommodation. Ears, Nose, Mouth, and Throat no gross abnormality of ear auricles or external auditory canals. normal hearing noted during conversation. mucus membranes moist. Respiratory normal breathing without difficulty. Cardiovascular 2+ dorsalis pedis/posterior tibialis pulses. no clubbing, cyanosis, significant edema, Musculoskeletal normal gait and posture. no significant deformity or arthritic changes, no loss or range of motion, no clubbing. Psychiatric this patient is able to make decisions and demonstrates good insight into disease process. Alert and Oriented x 3. pleasant and cooperative. General Notes: Upon inspection patient's wound bed actually showed signs of necrotic tissue and callus that did require sharp debridement today. I did discuss with the patient that we needed to perform debridement she was in agreement with the plan and subsequently we did perform debridement clearway the necrotic debris today which she tolerated quite well and postdebridement the wound bed appears to be doing much better which is great news. I do not see any signs of infection locally nor systemically which is  great news. With that being said again she seems to have pretty good arterial flow her pulses are excellent and again with debridement she has good flow as well which is excellent news. Still nonetheless I think that she is going require appropriate offloading [name] to discuss this in greater detail and the plan. Integumentary (Hair, Skin) Wound #1 status is Open. Original cause of wound was Gradually Appeared. The date acquired was: 08/18/2022. The wound is located on the Foot. The wound measures 1cm length x 0.9cm width x 0.3cm depth; 0.707cm^2 area and 0.212cm^3 volume. There is Fat Layer (Subcutaneous Tissue) exposed. There is no tunneling noted, however, there is undermining starting at 5:00 and ending at 8:00 with a maximum distance of 0.6cm. There is a medium amount of serosanguineous drainage noted. There is no granulation within the wound bed. There is a large (67-100%) amount of necrotic tissue within the wound bed including Adherent Slough. Assessment Active Problems ICD-10 Type 2 diabetes mellitus with foot ulcer Non-pressure chronic ulcer of other part of right foot with fat layer exposed Essential (primary) hypertension Other specified peripheral vascular diseases Long term (current) use of oral hypoglycemic drugs Piech, [name] ([id number]) [id number] Page 6 of  8 Procedures Wound #1 Pre-procedure diagnosis of Wound #1 is a Diabetic Wound/Ulcer of the Lower Extremity located on the Foot .Severity of Tissue Pre Debridement is: Fat layer exposed. There was a Excisional Skin/Subcutaneous Tissue Debridement with a total area of 0.71 sq cm performed by Allen Derry, PA-C. With the following instrument(s): Curette to remove Viable and Non-Viable tissue/material. Material removed includes Callus, Subcutaneous Tissue, and Slough after achieving pain control using Lidocaine 4% Topical Solution. No specimens were taken. A time out was conducted at 09:18, prior  to the start of the procedure. A Moderate amount of bleeding was controlled with Pressure. The procedure was tolerated well. Post Debridement Measurements: 1cm length x 0.9cm width x 0.3cm depth; 0.212cm^3 volume. Character of Wound/Ulcer Post Debridement is stable. Severity of Tissue Post Debridement is: Fat layer exposed. Post procedure Diagnosis Wound #1: Same as Pre-Procedure Plan Follow-up Appointments: Return Appointment in 1 week. Bathing/ Shower/ Hygiene: May shower; gently cleanse wound with antibacterial soap, rinse and pat dry prior to dressing wounds No tub bath. Anesthetic (Use 'Patient Medications' Section for Anesthetic Order Entry): Lidocaine applied to wound bed Off-Loading: Other: - please wear tennis shoes at this time WOUND #1: - Foot Wound Laterality: Cleanser: Byram Ancillary Kit - 15 Day Supply (DME) (Generic) 3 x Per Week/30 Days Discharge Instructions: Use supplies as instructed; Kit contains: (15) Saline Bullets; (15) 3x3 Gauze; 15 pr Gloves Cleanser: Soap and Water 3 x Per Week/30 Days Discharge Instructions: Gently cleanse wound with antibacterial soap, rinse and pat dry prior to dressing wounds Prim Dressing: Hydrofera Blue Ready Transfer Foam, 2.5x2.5 (in/in) 3 x Per Week/30 Days ary Discharge Instructions: Apply Hydrofera Blue Ready to wound bed as directed Secondary Dressing: (BORDER) Zetuvit Plus SILICONE BORDER Dressing 4x4 (in/in) (DME) (Generic) 3 x Per Week/30 Days Discharge Instructions: Please do not put silicone bordered dressings under wraps. Use non-bordered dressing only. 1. I would recommend currently that we have the patient continue to monitor for any signs of infection or worsening. Based on what I am seeing I do believe that she is really doing decently well here in regard to her wound all things considered. It is not quite as deep as what appeared initially which is good news as well. 2. I am good recommend as well that we initiate  treatment with the [facility] Blue followed by bordered foam dressing to cover. 3. I am also can recommend that she wear tennis shoes versus a postop shoe I think this may be more comfortable and easier for her and I am okay with that. We will see patient back for reevaluation in 1 week here in the clinic. If anything worsens or changes patient will contact our office for additional recommendations. Electronic Signature(s) Signed: 10/01/2022 12:33:36 PM By: Allen Derry PA-C Entered By: Allen Derry on 10/01/2022 12:33:36 -------------------------------------------------------------------------------- ROS/PFSH Details Patient Name: Date of Service: [name], [name]. 09/29/2022 8:15 A M Medical Record Number: [id number] Patient Account Number: [id number] Date of Birth/Sex: Treating RN: November 30, 1945 (77 y.o. [name] Primary Care Provider: Kara Dies Other Clinician: Referring Provider: Treating Provider/Extender: Claris Gower, Charanpreet Weeks in Treatment: 0 Information Obtained From Patient Constitutional Symptoms (General Health) Complaints and Symptoms: Negative for: Fatigue; Fever; Chills; Marked Weight Change Ear/Nose/Mouth/Throat Complaints and Symptoms: TAMANI, DURNEY ([id number]) [id number] Page 7 of 8 Negative for: Difficult clearing ears; Sinusitis Respiratory Complaints and Symptoms: Negative for: Chronic or frequent coughs; Shortness of Breath Gastrointestinal Complaints and Symptoms: Negative for: Frequent diarrhea; Nausea; Vomiting Genitourinary  Complaints and Symptoms: Negative for: Kidney failure/ Dialysis; Incontinence/dribbling Immunological Complaints and Symptoms: Negative for: Hives; Itching Integumentary (Skin) Complaints and Symptoms: Negative for: Wounds; Bleeding or bruising tendency; Breakdown; Swelling Neurologic Complaints and Symptoms: Negative for: Numbness/parasthesias;  Focal/Weakness Eyes Complaints and Symptoms: Review of System Notes: sees [place] eye doctor for right eye Medical History: Positive for: Cataracts - removed Hematologic/Lymphatic Medical History: Positive for: Anemia Cardiovascular Medical History: Positive for: Hypertension Endocrine Medical History: Positive for: Type II Diabetes Time with diabetes: 20 years Treated with: Oral agents Blood sugar tested every day: Yes Tested : once daily Blood sugar testing results: Breakfast: 104 Musculoskeletal Medical History: Positive for: Rheumatoid Arthritis Oncologic HBO Extended History Items Eyes: Cataracts Immunizations Pneumococcal Vaccine: Received Pneumococcal Vaccination: Yes Received Pneumococcal Vaccination On or After 60th BirthdaySOSHA, SHEPHERD ([id number]) [id number] Page 8 of 8 Implantable Devices None Family and Social History Never smoker; Alcohol Use: Never; Drug Use: No History; Caffeine Use: Daily - coffee Electronic Signature(s) Signed: 09/29/2022 4:09:01 PM By: Angelina Pih Signed: 09/29/2022 6:40:35 PM By: Allen Derry PA-C Entered By: Angelina Pih on 09/29/2022 08:30:57 -------------------------------------------------------------------------------- SuperBill Details Patient Name: Date of Service: [name], Jazma Richmond. 09/29/2022 Medical Record Number: [id number] Patient Account Number: [id number] Date of Birth/Sex: Treating RN: December 01, 1945 (77 y.o. [name] Primary Care Provider: Kara Dies Other Clinician: Referring Provider: Treating Provider/Extender: Claris Gower, Charanpreet Weeks in Treatment: 0 Diagnosis Coding ICD-10 Codes Code Description E11.621 Type 2 diabetes mellitus with foot ulcer L97.512 Non-pressure chronic ulcer of other part of right foot with fat layer exposed I10 Essential (primary) hypertension I73.89 Other specified peripheral vascular diseases Z79.84 Long term  (current) use of oral hypoglycemic drugs Facility Procedures : CPT4 Code: 53664403 Description: 99213 - WOUND CARE VISIT-LEV 3 EST PT Modifier: Quantity: 1 : CPT4 Code: 47425956 Description: 11042 - DEB SUBQ TISSUE 20 SQ CM/< ICD-10 Diagnosis Description L97.512 Non-pressure chronic ulcer of other part of right foot with fat layer exposed Modifier: Quantity: 1 Physician Procedures : CPT4 Code Description Modifier [id number] WC PHYS LEVEL 3 NEW PT 25 ICD-10 Diagnosis Description E11.621 Type 2 diabetes mellitus with foot ulcer L97.512 Non-pressure chronic ulcer of other part of right foot with fat layer exposed I10 Essential (primary)  hypertension I73.89 Other specified peripheral vascular diseases Quantity: 1 : [id number] 11042 - WC PHYS SUBQ TISS 20 SQ CM ICD-10 Diagnosis Description L97.512 Non-pressure chronic ulcer of other part of right foot with fat layer exposed Quantity: 1 Electronic Signature(s) Signed: 10/01/2022 12:37:20 PM By: Allen Derry PA-C Entered By: Allen Derry on 10/01/2022 12:37:20

## 2022-09-30 NOTE — Progress Notes (Signed)
REVIA, NGHIEM ([id number]) [id number].pdf Page 1 of 4 Visit Report for 09/29/2022 Abuse Risk Screen Details Patient Name: Date of Service: [name], [name] 09/29/2022 8:15 A M Medical Record Number: [id number] Patient Account Number: [id number] Date of Birth/Sex: Treating RN: Dec 20, 1945 (77 y.o. [name] Primary Care Quinlynn Cuthbert: Kara Dies Other Clinician: Referring Tuan Tippin: Treating Ajdin Macke/Extender: Claris Gower, Charanpreet Weeks in Treatment: 0 Abuse Risk Screen Items Answer ABUSE RISK SCREEN: Has anyone close to you tried to hurt or harm you recentlyo No Do you feel uncomfortable with anyone in your familyo No Has anyone forced you do things that you didnt want to doo No Electronic Signature(s) Signed: 09/29/2022 4:09:01 PM By: Angelina Pih Entered By: Angelina Pih on 09/29/2022 08:31:04 -------------------------------------------------------------------------------- Activities of Daily Living Details Patient Name: Date of Service: [name], [name]. 09/29/2022 8:15 A M Medical Record Number: [id number] Patient Account Number: [id number] Date of Birth/Sex: Treating RN: 01-07-1946 (77 y.o. [name] Primary Care Odessie Polzin: Kara Dies Other Clinician: Referring Traycen Goyer: Treating Evellyn Tuff/Extender: Claris Gower, Charanpreet Weeks in Treatment: 0 Activities of Daily Living Items Answer Activities of Daily Living (Please select one for each item) Drive Automobile Completely Able T Medications ake Completely Able Use T elephone Completely Able Care for Appearance Completely Able Use T oilet Completely Able Bath / Shower Completely Able Dress Self Completely Able Feed Self Completely Able Walk Completely Able Get In / Out Bed Completely Able Housework Completely Able Prepare Meals Completely Able Handle Money Completely Able Shop for Self Completely Able Electronic Signature(s) Signed:  09/29/2022 4:09:01 PM By: Angelina Pih Entered By: Angelina Pih on 09/29/2022 08:31:29 -------------------------------------------------------------------------------- Education Screening Details Patient Name: Date of Service: [name], [name]. 09/29/2022 8:15 A M Medical Record Number: [id number] Patient Account Number: [id number] Date of Birth/Sex: Treating RN: [birth year] (77 y.o. [name] Primary Care Naimah Yingst: Kara Dies Other Clinician: Referring Koston Hennes: Treating Nel Stoneking/Extender: Claris Gower, Charanpreet Weeks in Treatment: 0 [name] ([id number]) Constance Holster [phone].pdf Page 2 of 4 Learning Preferences/Education Level/Primary Language Learning Preference: Explanation, Demonstration, Video, Public affairs consultant, Printed Material Preferred Language: Economist Language Barrier: No Translator Needed: No Memory Deficit: No Emotional Barrier: No Cultural/Religious Beliefs Affecting Medical Care: No Physical Barrier Impaired Vision: No Impaired Hearing: No Decreased Hand dexterity: No Knowledge/Comprehension Knowledge Level: High Comprehension Level: High Ability to understand written instructions: High Ability to understand verbal instructions: High Motivation Anxiety Level: Calm Cooperation: Cooperative Education Importance: Acknowledges Need Interest in Health Problems: Asks Questions Perception: Coherent Willingness to Engage in Self-Management High Activities: Readiness to Engage in Self-Management High Activities: Electronic Signature(s) Signed: 09/29/2022 4:09:01 PM By: Angelina Pih Entered By: Angelina Pih on 09/29/2022 08:31:46 -------------------------------------------------------------------------------- Fall Risk Assessment Details Patient Name: Date of Service: [name], [name]. 09/29/2022 8:15 A M Medical Record Number: [id number] Patient Account Number: [id number] Date  of Birth/Sex: Treating RN: 1945-08-01 (77 y.o. [name] Primary Care Kynsie Falkner: Kara Dies Other Clinician: Referring Janita Camberos: Treating Byanca Kasper/Extender: Claris Gower, Charanpreet Weeks in Treatment: 0 Fall Risk Assessment Items Have you had 2 or more falls in the last 12 monthso 0 No Have you had any fall that resulted in injury in the last 12 monthso 0 No FALLS RISK SCREEN History of falling - immediate or within 3 months 0 No Secondary diagnosis (Do you have 2 or more medical diagnoseso) 0 No Ambulatory aid None/bed rest/wheelchair/nurse 0 Yes Crutches/cane/walker 0 No Furniture 0 No Intravenous therapy Access/Saline/Heparin Lock 0  No Gait/Transferring Normal/ bed rest/ wheelchair 0 Yes Weak (short steps with or without shuffle, stooped but able to lift head while walking, may seek 0 No support from furniture) Impaired (short steps with shuffle, may have difficulty arising from chair, head down, impaired 0 No balance) Mental Status Oriented to own ability 0 Yes Electronic Signature(s) [name] ([id number]) [id number].pdf Page 3 of 4 Signed: 09/29/2022 4:09:01 PM By: Angelina Pih Entered By: Angelina Pih on 09/29/2022 08:32:10 -------------------------------------------------------------------------------- Foot Assessment Details Patient Name: Date of Service: [name], [name]. 09/29/2022 8:15 A M Medical Record Number: [id number] Patient Account Number: [id number] Date of Birth/Sex: Treating RN: 09-06-1945 (77 y.o. [name] Primary Care Jozef Eisenbeis: Kara Dies Other Clinician: Referring Lavontay Kirk: Treating Zuriyah Shatz/Extender: Claris Gower, Charanpreet Weeks in Treatment: 0 Foot Assessment Items Site Locations + = Sensation present, - = Sensation absent, C = Callus, U = Ulcer R = Redness, W = Warmth, M = Maceration, PU = Pre-ulcerative lesion F = Fissure, S = Swelling, D =  Dryness Assessment Right: Left: Other Deformity: No No Prior Foot Ulcer: No No Prior Amputation: No No Charcot Joint: No No Ambulatory Status: Ambulatory Without Help Gait: Steady Electronic Signature(s) Signed: 09/29/2022 4:09:01 PM By: Angelina Pih Entered By: Angelina Pih on 09/29/2022 08:41:14 -------------------------------------------------------------------------------- Nutrition Risk Screening Details Patient Name: Date of Service: [name], [name]. 09/29/2022 8:15 A M Medical Record Number: [id number] Patient Account Number: [id number] Date of Birth/Sex: Treating RN: 12-20-1945 (77 y.o. [name] Primary Care Adiana Smelcer: Kara Dies Other Clinician: Referring Burel Kahre: Treating Nyssa Sayegh/Extender: Claris Gower, Charanpreet Weeks in Treatment: 0 Height (in): 63 Weight (lbs): 126 Body Mass Index (BMI): 22.3 Montesdeoca, Devonna Richmond ([id number]) [id number].pdf Page 4 of 4 Nutrition Risk Screening Items Score Screening NUTRITION RISK SCREEN: I have an illness or condition that made me change the kind and/or amount of food I eat 0 No I eat fewer than two meals per day 0 No I eat few fruits and vegetables, or milk products 0 No I have three or more drinks of beer, liquor or wine almost every day 0 No I have tooth or mouth problems that make it hard for me to eat 0 No I don't always have enough money to buy the food I need 0 No I eat alone most of the time 0 No I take three or more different prescribed or over-the-counter drugs a day 0 No Without wanting to, I have lost or gained 10 pounds in the last six months 0 No I am not always physically able to shop, cook and/or feed myself 0 No Nutrition Protocols Good Risk Protocol 0 No interventions needed Moderate Risk Protocol High Risk Proctocol Risk Level: Good Risk Score: 0 Electronic Signature(s) Signed: 09/29/2022 4:09:01 PM By: Angelina Pih Entered By: Angelina Pih  on 09/29/2022 08:32:17

## 2022-09-30 NOTE — Progress Notes (Signed)
DARI, CARPENITO ([id number]) [id number] Page 1 of 8 Visit Report for 09/29/2022 Allergy List Details Patient Name: Date of Service: [name], [name] 09/29/2022 8:15 A M Medical Record Number: [id number] Patient Account Number: [id number] Date of Birth/Sex: Treating RN: [birth year] (77 y.o. [name] Primary Care Loriel Diehl: Kara Dies Other Clinician: Referring Rosaelena Kemnitz: Treating Ryun Velez/Extender: Claris Gower, Charanpreet Weeks in Treatment: 0 Allergies Active Allergies oxycodone Allergy Notes Electronic Signature(s) Signed: 09/29/2022 4:09:01 PM By: Angelina Pih Entered By: Angelina Pih on 09/29/2022 08:25:55 -------------------------------------------------------------------------------- Arrival Information Details Patient Name: Date of Service: [name], [name]. 09/29/2022 8:15 A M Medical Record Number: [id number] Patient Account Number: [id number] Date of Birth/Sex: Treating RN: February 11, 1946 (77 y.o. [name] Primary Care Lakeithia Rasor: Kara Dies Other Clinician: Referring Nazaret Chea: Treating Lathyn Griggs/Extender: Claris Gower, Charanpreet Weeks in Treatment: 0 Visit Information Patient Arrived: Ambulatory Arrival Time: 08:24 Accompanied By: self Transfer Assistance: None Patient Identification Verified: Yes Secondary Verification Process Completed: Yes Patient Has Alerts: Yes Patient Alerts: type 2 diabetic ABI 09/29/22 [place]/R [place] Electronic Signature(s) Signed: 09/29/2022 4:02:35 PM By: Angelina Pih Entered By: Angelina Pih on 09/29/2022 16:02:35 -------------------------------------------------------------------------------- Clinic Level of Care Assessment Details Patient Name: Date of Service: [name], [name]. 09/29/2022 8:15 A M Medical Record Number: [id number] Patient Account Number: [id number] Date of Birth/Sex: Treating RN: 04/04/1946 (76 y.o. [name] Primary Care Jackob Crookston:  Kara Dies Other Clinician: Referring Ferdie Bakken: Treating Yeraldine Forney/Extender: Claris Gower, Charanpreet Weeks in Treatment: 0 Clinic Level of Care Assessment Items TOOL 1 Quantity Score []  - 0 Use when EandM and Procedure is performed on INITIAL visit ASSESSMENTS - Nursing Assessment / Reassessment X- 1 20 General Physical Exam (combine w/ comprehensive assessment (listed just below) when performed on new pt. evals) X- 1 25 Comprehensive Assessment (HX, ROS, Risk Assessments, Wounds Hx, etc.) [name] ([id number]) [id number] Page 2 of 8 ASSESSMENTS - Wound and Skin Assessment / Reassessment []  - 0 Dermatologic / Skin Assessment (not related to wound area) ASSESSMENTS - Ostomy and/or Continence Assessment and Care []  - 0 Incontinence Assessment and Management []  - 0 Ostomy Care Assessment and Management (repouching, etc.) PROCESS - Coordination of Care X - Simple Patient / Family Education for ongoing care 1 15 []  - 0 Complex (extensive) Patient / Family Education for ongoing care X- 1 10 Staff obtains Chiropractor, Records, T Results / Process Orders est []  - 0 Staff telephones HHA, Nursing Homes / Clarify orders / etc []  - 0 Routine Transfer to another Facility (non-emergent condition) []  - 0 Routine Hospital Admission (non-emergent condition) X- 1 15 New Admissions / Manufacturing engineer / Ordering NPWT Apligraf, etc. , []  - 0 Emergency Hospital Admission (emergent condition) PROCESS - Special Needs []  - 0 Pediatric / Minor Patient Management []  - 0 Isolation Patient Management []  - 0 Hearing / Language / Visual special needs []  - 0 Assessment of Community assistance (transportation, D/C planning, etc.) []  - 0 Additional assistance / Altered mentation []  - 0 Support Surface(s) Assessment (bed, cushion, seat, etc.) INTERVENTIONS - Miscellaneous []  - 0 External ear exam []  - 0 Patient Transfer (multiple staff /  Nurse, adult / Similar devices) []  - 0 Simple Staple / Suture removal (25 or less) []  - 0 Complex Staple / Suture removal (26 or more) []  - 0 Hypo/Hyperglycemic Management (do not check if billed separately) X- 1 15 Ankle / Brachial Index (ABI) - do not check if billed separately Has the patient been seen  at the hospital within the last three years: Yes Total Score: 100 Level Of Care: New/Established - Level 3 Electronic Signature(s) Signed: 09/29/2022 4:09:01 PM By: Angelina Pih Entered By: Angelina Pih on 09/29/2022 10:13:08 -------------------------------------------------------------------------------- Encounter Discharge Information Details Patient Name: Date of Service: [name], [name]. 09/29/2022 8:15 A M Medical Record Number: [id number] Patient Account Number: [id number] Date of Birth/Sex: Treating RN: 1945-07-20 (77 y.o. [name] Primary Care Samera Macy: Kara Dies Other Clinician: Referring Aryaa Bunting: Treating Vipul Cafarelli/Extender: Claris Gower, Charanpreet Weeks in Treatment: 0 Encounter Discharge Information Items Post Procedure Vitals Discharge Condition: Stable Temperature (F): 98 Ambulatory Status: Ambulatory Pulse (bpm): 100 Discharge Destination: Home Respiratory Rate (breaths/min): 18 Transportation: Private Auto Blood Pressure (mmHg): 165/77 Accompanied By: self Schedule Follow-up Appointment: Yes Clinical Summary of CareYOCELINE, Richmond ([id number]) [id number] Page 3 of 8 Electronic Signature(s) Signed: 09/29/2022 10:19:46 AM By: Angelina Pih Entered By: Angelina Pih on 09/29/2022 10:19:45 -------------------------------------------------------------------------------- Lower Extremity Assessment Details Patient Name: Date of Service: [name], [name]. 09/29/2022 8:15 A M Medical Record Number: [id number] Patient Account Number: [id number] Date of Birth/Sex: Treating RN: 15-Sep-1945 (77 y.o. [name] Primary Care Adil Tugwell: Kara Dies Other Clinician: Referring Lashonta Pilling: Treating Birda Didonato/Extender: Claris Gower, Charanpreet Weeks in Treatment: 0 Edema Assessment Assessed: [Left: No] [Right: No] Edema: [Left: No] [Right: No] Calf Left: Right: Point of Measurement: 31 cm From Medial Instep 28.1 cm Ankle Left: Right: Point of Measurement: 9 cm From Medial Instep 19 cm Vascular Assessment Pulses: Dorsalis Pedis Palpable: [Left:Yes] [Right:Yes] Posterior Tibial Palpable: [Left:Yes] [Right:Yes] Notes bilateral ABI [place] Electronic Signature(s) Signed: 09/29/2022 4:09:01 PM By: Angelina Pih Entered By: Angelina Pih on 09/29/2022 08:40:53 -------------------------------------------------------------------------------- Multi Wound Chart Details Patient Name: Date of Service: [name], [name]. 09/29/2022 8:15 A M Medical Record Number: [id number] Patient Account Number: [id number] Date of Birth/Sex: Treating RN: Apr 18, 1946 (77 y.o. [name] Primary Care Neeko Pharo: Kara Dies Other Clinician: Referring Dynasty Holquin: Treating Anjanette Gilkey/Extender: Claris Gower, Charanpreet Weeks in Treatment: 0 Vital Signs Height(in): 63 Pulse(bpm): 100 Weight(lbs): 126 Blood Pressure(mmHg): 165/75 Body Mass Index(BMI): 22.3 Temperature(F): 98 Respiratory Rate(breaths/min): 18 [1:Photos:] [N/A:N/A] Foot N/A N/A Wound Location: Gradually Appeared N/A N/A Wounding Event: Diabetic Wound/Ulcer of the Lower N/A N/A Primary Etiology: Extremity Cataracts, Anemia, Hypertension, N/A N/A Comorbid History: Type II Diabetes, Rheumatoid Arthritis 08/18/2022 N/A N/A Date Acquired: 0 N/A N/A Weeks of Treatment: Open N/A N/A Wound Status: No N/A N/A Wound Recurrence: 1x0.9x0.3 N/A N/A Measurements Richmond x W x D (cm) 0.707 N/A N/A A (cm) : rea 0.212 N/A N/A Volume (cm) : 5 Starting Position 1 (o'clock): 8 Ending Position 1  (o'clock): 0.6 Maximum Distance 1 (cm): Yes N/A N/A Undermining: Grade 2 N/A N/A Classification: Medium N/A N/A Exudate A mount: Serosanguineous N/A N/A Exudate Type: red, brown N/A N/A Exudate Color: None Present (0%) N/A N/A Granulation A mount: Large (67-100%) N/A N/A Necrotic A mount: Fat Layer (Subcutaneous Tissue): Yes N/A N/A Exposed Structures: None N/A N/A Epithelialization: Debridement - Excisional N/A N/A Debridement: Pre-procedure Verification/Time Out 09:18 N/A N/A Taken: Lidocaine 4% Topical Solution N/A N/A Pain Control: Callus, Subcutaneous, Slough N/A N/A Tissue Debrided: Skin/Subcutaneous Tissue N/A N/A Level: 0.71 N/A N/A Debridement A (sq cm): rea Curette N/A N/A Instrument: Moderate N/A N/A Bleeding: Pressure N/A N/A Hemostasis A chieved: Procedure was tolerated well N/A N/A Debridement Treatment Response: 1x0.9x0.3 N/A N/A Post Debridement Measurements [place] x W x D (cm) 0.212 N/A N/A Post Debridement Volume: (cm) Debridement  N/A N/A Procedures Performed: Treatment Notes Electronic Signature(s) Signed: 09/29/2022 10:12:49 AM By: Angelina Pih Entered By: Angelina Pih on 09/29/2022 10:12:49 -------------------------------------------------------------------------------- Multi-Disciplinary Care Plan Details Patient Name: Date of Service: [name], [name]. 09/29/2022 8:15 A M Medical Record Number: [id number] Patient Account Number: [id number] Date of Birth/Sex: Treating RN: February 28, 1946 (77 y.o. [name] Primary Care Denario Bagot: Kara Dies Other Clinician: Referring Huxley Vanwagoner: Treating Latrecia Capito/Extender: Claris Gower, Charanpreet Weeks in Treatment: 0 Active Inactive Necrotic Tissue Nursing Diagnoses: Impaired tissue integrity related to necrotic/devitalized tissue Knowledge deficit related to management of necrotic/devitalized tissue Goals: Necrotic/devitalized tissue will be minimized in the wound bed Date  Initiated: 09/29/2022 Target Resolution Date: 10/27/2022 Goal Status: Active Patient/caregiver will verbalize understanding of reason and process for debridement of necrotic tissue Corney, Tayanna Richmond ([id number]) [id number] Page 5 of 8 Date Initiated: 09/29/2022 Date Inactivated: 09/29/2022 Target Resolution Date: 09/29/2022 Goal Status: Met Interventions: Assess patient pain level pre-, during and post procedure and prior to discharge Provide education on necrotic tissue and debridement process Treatment Activities: Apply topical anesthetic as ordered : 09/29/2022 Excisional debridement : 09/29/2022 Notes: Tissue Oxygenation Nursing Diagnoses: Potential alteration in peripheral tissue perfusion (select prior to confirmation of diagnosis) Goals: Patient/caregiver will verbalize understanding of disease process and disease management Date Initiated: 09/29/2022 Target Resolution Date: 10/06/2022 Goal Status: Active Interventions: Assess patient understanding of disease process and management upon diagnosis and as needed Assess peripheral arterial status upon admission and as needed Provide education on tissue oxygenation and ischemia Notes: Wound/Skin Impairment Nursing Diagnoses: Impaired tissue integrity Knowledge deficit related to ulceration/compromised skin integrity Goals: Ulcer/skin breakdown will have a volume reduction of 30% by week 4 Date Initiated: 09/29/2022 Target Resolution Date: 10/27/2022 Goal Status: Active Ulcer/skin breakdown will have a volume reduction of 50% by week 8 Date Initiated: 09/29/2022 Target Resolution Date: 11/24/2022 Goal Status: Active Ulcer/skin breakdown will have a volume reduction of 80% by week 12 Date Initiated: 09/29/2022 Target Resolution Date: 12/22/2022 Goal Status: Active Ulcer/skin breakdown will heal within 14 weeks Date Initiated: 09/29/2022 Target Resolution Date: 01/05/2023 Goal Status:  Active Interventions: Assess patient/caregiver ability to obtain necessary supplies Assess patient/caregiver ability to perform ulcer/skin care regimen upon admission and as needed Assess ulceration(s) every visit Provide education on ulcer and skin care Treatment Activities: Skin care regimen initiated : 09/29/2022 Notes: Electronic Signature(s) Signed: 09/29/2022 10:16:12 AM By: Angelina Pih Previous Signature: 09/29/2022 10:15:53 AM Version By: Angelina Pih Entered By: Angelina Pih on 09/29/2022 10:16:11 -------------------------------------------------------------------------------- Pain Assessment Details Patient Name: Date of Service: [name], [name]. 09/29/2022 8:15 A M Medical Record Number: [id number] Patient Account Number: [id number] Date of Birth/Sex: Treating RN: Sep 25, 1945 (77 y.o. [name] Primary Care Sabryna Lahm: Kara Dies Other Clinician: Referring Hatim Homann: Treating Val Schiavo/Extender: Claris Gower, Charanpreet Weeks in Treatment: 0 [name], [name] ([id number]) [id number] Page 6 of 8 Active Problems Location of Pain Severity and Description of Pain Patient Has Paino No Site Locations Rate the pain. Current Pain Level: 0 Pain Management and Medication Current Pain Management: Electronic Signature(s) Signed: 09/29/2022 4:09:01 PM By: Angelina Pih Entered By: Angelina Pih on 09/29/2022 08:24:56 -------------------------------------------------------------------------------- Patient/Caregiver Education Details Patient Name: Date of Service: [name], [name]. 4/29/2024andnbsp8:15 A M Medical Record Number: [id number] Patient Account Number: [id number] Date of Birth/Gender: Treating RN: 10/12/1945 (77 y.o. [name] Primary Care Physician: Kara Dies Other Clinician: Referring Physician: Treating Physician/Extender: Lionel December Weeks in Treatment: 0 Education  Assessment Education Provided To: Patient Education Topics  Provided Welcome [facility]New [facility] Packet: o Handouts: [facility] P[facility] form, Welcome T [facility] o Methods: Explain/Verbal Wound Debridement: Handouts: Wound Debridement Methods: Explain/Verbal Responses: State content correctly Wound/Skin Impairment: Handouts: Caring for Your Ulcer Methods: Explain/Verbal Responses: State content correctly Electronic Signature(s) Signed: 09/29/2022 4:09:01 PM By: Angelina Pih Entered By: Angelina Pih on 09/29/2022 10:16:36 [name] ([id number]) [id number] Page 7 of 8 -------------------------------------------------------------------------------- Wound Assessment Details Patient Name: Date of Service: [name], [name] 09/29/2022 8:15 A M Medical Record Number: [id number] Patient Account Number: [id number] Date of Birth/Sex: Treating RN: 29-Apr-1946 (77 y.o. [name] Primary Care Thayer Embleton: Kara Dies Other Clinician: Referring Emila Steinhauser: Treating Takeisha Cianci/Extender: Claris Gower, Charanpreet Weeks in Treatment: 0 Wound Status Wound Number: 1 Primary Diabetic Wound/Ulcer of the Lower Extremity Etiology: Wound Location: Foot Wound Status: Open Wounding Event: Gradually Appeared Comorbid Cataracts, Anemia, Hypertension, Type II Diabetes, Date Acquired: 08/18/2022 History: Rheumatoid Arthritis Weeks Of Treatment: 0 Clustered Wound: No Photos Wound Measurements Length: (cm) 1 Width: (cm) 0.9 Depth: (cm) 0.3 Area: (cm) 0.707 Volume: (cm) 0.212 % Reduction in Area: % Reduction in Volume: Epithelialization: None Tunneling: No Undermining: Yes Starting Position (o'clock): 5 Ending Position (o'clock): 8 Maximum Distance: (cm) 0.6 Wound Description Classification: Grade 2 Exudate Amount: Medium Exudate Type: Serosanguineous Exudate Color: red, brown Foul Odor After Cleansing:  No Slough/Fibrino Yes Wound Bed Granulation Amount: None Present (0%) Exposed Structure Necrotic Amount: Large (67-100%) Fat Layer (Subcutaneous Tissue) Exposed: Yes Necrotic Quality: Adherent Slough Treatment Notes Wound #1 (Foot) Cleanser [place] Ancillary Kit - 15 Day Supply Discharge Instruction: Use supplies as instructed; Kit contains: (15) Saline Bullets; (15) 3x3 Gauze; 15 pr Gloves Soap and Water Discharge Instruction: Gently cleanse wound with antibacterial soap, rinse and pat dry prior to dressing wounds Peri-Wound Care Topical Primary Dressing Hydrofera Blue Ready Transfer Foam, 2.5x2.5 (in/in) Discharge Instruction: Apply Hydrofera Blue Ready to wound bed as directed Secondary Dressing (BORDER) Zetuvit Plus SILICONE BORDER Dressing 4x4 (in/in) Dimaggio, [name] ([id number]) [id number] Page 8 of 8 Discharge Instruction: Please do not put silicone bordered dressings under wraps. Use non-bordered dressing only. Secured With Compression Wrap Compression Stockings Facilities manager) Signed: 09/29/2022 4:09:01 PM By: Angelina Pih Entered By: Angelina Pih on 09/29/2022 09:24:10 -------------------------------------------------------------------------------- Vitals Details Patient Name: Date of Service: [name], [name]. 09/29/2022 8:15 A M Medical Record Number: [id number] Patient Account Number: [id number] Date of Birth/Sex: Treating RN: Apr 20, 1946 (77 y.o. [name] Primary Care Marcia Lepera: Kara Dies Other Clinician: Referring Moss Berry: Treating Hezikiah Retzloff/Extender: Claris Gower, Charanpreet Weeks in Treatment: 0 Vital Signs Time Taken: 08:25 Temperature (F): 98 Height (in): 63 Pulse (bpm): 100 Source: Stated Respiratory Rate (breaths/min): 18 Weight (lbs): 126 Blood Pressure (mmHg): 165/75 Source: Stated Reference Range: 80 - 120 mg / dl Body Mass Index (BMI): 22.3 Electronic  Signature(s) Signed: 09/29/2022 4:09:01 PM By: Angelina Pih Entered By: Angelina Pih on 09/29/2022 08:25:36

## 2022-10-06 ENCOUNTER — Encounter: Payer: Medicare PPO | Attending: Physician Assistant | Admitting: Physician Assistant

## 2022-10-06 ENCOUNTER — Other Ambulatory Visit: Payer: Self-pay | Admitting: Physician Assistant

## 2022-10-06 ENCOUNTER — Ambulatory Visit
Admission: RE | Admit: 2022-10-06 | Discharge: 2022-10-06 | Disposition: A | Discharge status: Home / Self Care | Payer: Medicare PPO | Attending: Physician Assistant | Admitting: Physician Assistant

## 2022-10-06 ENCOUNTER — Ambulatory Visit
Admission: RE | Admit: 2022-10-06 | Discharge: 2022-10-06 | Disposition: A | Discharge status: Home / Self Care | Payer: Medicare PPO | Source: Ambulatory Visit | Attending: Physician Assistant | Admitting: Physician Assistant

## 2022-10-06 DIAGNOSIS — S81801A Unspecified open wound, right lower leg, initial encounter: Secondary | ICD-10-CM | POA: Diagnosis not present

## 2022-10-06 DIAGNOSIS — E1151 Type 2 diabetes mellitus with diabetic peripheral angiopathy without gangrene: Secondary | ICD-10-CM | POA: Insufficient documentation

## 2022-10-06 DIAGNOSIS — E11621 Type 2 diabetes mellitus with foot ulcer: Secondary | ICD-10-CM | POA: Insufficient documentation

## 2022-10-06 DIAGNOSIS — Z7984 Long term (current) use of oral hypoglycemic drugs: Secondary | ICD-10-CM | POA: Insufficient documentation

## 2022-10-06 DIAGNOSIS — I10 Essential (primary) hypertension: Secondary | ICD-10-CM | POA: Diagnosis not present

## 2022-10-06 DIAGNOSIS — L97512 Non-pressure chronic ulcer of other part of right foot with fat layer exposed: Secondary | ICD-10-CM | POA: Diagnosis not present

## 2022-10-06 NOTE — Progress Notes (Signed)
[name], ARNALL ([id number]) [id number] Page 1 of 6 Visit Report for 10/06/2022 Chief Complaint Document Details Patient Name: Date of Service: [name], [name] 10/06/2022 9:45 A M Medical Record Number: [id number] Patient Account Number: [id number] Date of Birth/Sex: Treating RN: 04-25-1946 (77 y.o. [name] Primary Care Provider: Kara Dies Other Clinician: Referring Provider: Treating Provider/Extender: Claris Gower, Charanpreet Weeks in Treatment: 1 Information Obtained from: Patient Chief Complaint Right foot ulcer Electronic Signature(s) Signed: 10/06/2022 9:34:40 AM By: Allen Derry PA-C Entered By: Allen Derry on 10/06/2022 09:34:40 -------------------------------------------------------------------------------- Debridement Details Patient Name: Date of Service: [name], [name] 10/06/2022 9:45 A M Medical Record Number: [id number] Patient Account Number: [id number] Date of Birth/Sex: Treating RN: [birth year] (77 y.o. [name] Primary Care Provider: Kara Dies Other Clinician: Referring Provider: Treating Provider/Extender: Claris Gower, Charanpreet Weeks in Treatment: 1 Debridement Performed for Assessment: Wound #1 Right,Medial Foot Performed By: Physician Allen Derry, PA-C Debridement Type: Debridement Severity of Tissue Pre Debridement: Fat layer exposed Level of Consciousness (Pre-procedure): Awake and Alert Pre-procedure Verification/Time Out Yes - 10:15 Taken: Pain Control: Lidocaine 4% T opical Solution Percent of Wound Bed Debrided: 100% T Area Debrided (cm): otal 0.27 Tissue and other material debrided: Viable, Non-Viable, Callus, Slough, Subcutaneous, Slough Level: Skin/Subcutaneous Tissue Debridement Description: Excisional Instrument: Curette Bleeding: Moderate Hemostasis Achieved: Pressure Response to Treatment: Procedure was tolerated well Level of Consciousness (Post- Awake and  Alert procedure): Post Debridement Measurements of Total Wound Length: (cm) 0.5 Width: (cm) 0.7 Depth: (cm) 0.4 Volume: (cm) 0.11 Character of Wound/Ulcer Post Debridement: Stable Severity of Tissue Post Debridement: Fat layer exposed Post Procedure Diagnosis Same as Pre-procedure Electronic Signature(s) Signed: 10/06/2022 4:34:24 PM By: Angelina Pih Signed: 10/06/2022 6:09:56 PM By: Allen Derry PA-C Entered By: Angelina Pih on 10/06/2022 10:24:45 Roanna Epley, Renelle L ([id number]) [id number] Page 2 of 6 -------------------------------------------------------------------------------- HPI Details Patient Name: Date of Service: [name], [name] 10/06/2022 9:45 A M Medical Record Number: [id number] Patient Account Number: [id number] Date of Birth/Sex: Treating RN: 07/03/1945 (77 y.o. [name] Primary Care Provider: Kara Dies Other Clinician: Referring Provider: Treating Provider/Extender: Claris Gower, Charanpreet Weeks in Treatment: 1 History of Present Illness HPI Description: 09-29-2022 upon evaluation today patient appears to be doing poorly currently in regard to her wound on her right foot medially just at the base of her great toe but more plantar aspect. Unfortunately this has been open since around March 18 according to what the patient tells me although she tells me that she had "a bump there before started opening and draining". Fortunately there does not appear to be any signs of systemic infection though locally this does appear to be quite deep just the one concern I have about the potential for osteomyelitis this is something we can have to keep close eye on and fully evaluate. Fortunately there does not appear to be any signs of active infection locally or systemically which is great news. She does have a issue here with the ABIs she was noncompressible today so we were not able to determine an exact blood flow currently.  With that being said she does have seemingly good capillary refill and likely good arterial flow to be able to heal this. Patient does have a history of diabetes mellitus type 2 with a hemoglobin A1c stated to be 6.0 on September 11, 2022. She also has a history of hypertension, peripheral vascular disease, and she is on oral hypoglycemic agents at this point. 10-06-2022  upon evaluation today patient appears to be doing decently well in regard to her wound she is making some progress which is good news. Fortunately there does not appear to be any signs of active infection locally nor systemically which is also great news. No fevers, chills, nausea, vomiting, or diarrhea. Electronic Signature(s) Signed: 10/06/2022 10:26:36 AM By: Allen Derry PA-C Entered By: Allen Derry on 10/06/2022 10:26:36 -------------------------------------------------------------------------------- Physical Exam Details Patient Name: Date of Service: [name], [name] 10/06/2022 9:45 A M Medical Record Number: [id number] Patient Account Number: [id number] Date of Birth/Sex: Treating RN: August 06, 1945 (77 y.o. [name] Primary Care Provider: Kara Dies Other Clinician: Referring Provider: Treating Provider/Extender: Claris Gower, Charanpreet Weeks in Treatment: 1 Constitutional Well-nourished and well-hydrated in no acute distress. Respiratory normal breathing without difficulty. Psychiatric this patient is able to make decisions and demonstrates good insight into disease process. Alert and Oriented x 3. pleasant and cooperative. Notes Upon inspection patient's wound bed actually showed signs of good granulation epithelization at this point. There was some need for sharp debridement to remove some of the necrotic debris patient tolerated that today without complication and postdebridement the wound bed appears to be doing much better which is great news. Electronic Signature(s) Signed: 10/06/2022 10:27:00  AM By: Allen Derry PA-C Entered By: Allen Derry on 10/06/2022 10:26:59 -------------------------------------------------------------------------------- Physician Orders Details Patient Name: Date of Service: [name], [name] 10/06/2022 9:45 A M Medical Record Number: [id number] Patient Account Number: [id number] Date of Birth/Sex: Treating RN: May 17, 1946 (77 y.o. [name] Primary Care Provider: Kara Dies Other Clinician: Referring Provider: Treating Provider/Extender: Claris Gower, Charanpreet Weeks in Treatment: 1 Verbal / Phone Orders: No Diagnosis Coding LAZARIA, KUDRICK ([id number]) [id number] Page 3 of 6 ICD-10 Coding Code Description E11.621 Type 2 diabetes mellitus with foot ulcer L97.512 Non-pressure chronic ulcer of other part of right foot with fat layer exposed I10 Essential (primary) hypertension I73.89 Other specified peripheral vascular diseases Z79.84 Long term (current) use of oral hypoglycemic drugs Follow-up Appointments Return Appointment in 1 week. Bathing/ Shower/ Hygiene May shower; gently cleanse wound with antibacterial soap, rinse and pat dry prior to dressing wounds No tub bath. Anesthetic (Use 'Patient Medications' Section for Anesthetic Order Entry) Lidocaine applied to wound bed Off-Loading Other: - please wear tennis shoes at this time Wound Treatment Wound #1 - Foot Wound Laterality: Right, Medial Cleanser: Byram Ancillary Kit - 15 Day Supply (Generic) 3 x Per Week/30 Days Discharge Instructions: Use supplies as instructed; Kit contains: (15) Saline Bullets; (15) 3x3 Gauze; 15 pr Gloves Cleanser: Soap and Water 3 x Per Week/30 Days Discharge Instructions: Gently cleanse wound with antibacterial soap, rinse and pat dry prior to dressing wounds Prim Dressing: Hydrofera Blue Ready Transfer Foam, 2.5x2.5 (in/in) 3 x Per Week/30 Days ary Discharge Instructions: Apply Hydrofera Blue Ready to wound bed  as directed Secondary Dressing: (BORDER) Zetuvit Plus SILICONE BORDER Dressing 4x4 (in/in) (Generic) 3 x Per Week/30 Days Discharge Instructions: Please do not put silicone bordered dressings under wraps. Use non-bordered dressing only. Radiology X-ray, foot - X-ray right foot complete Electronic Signature(s) Signed: 10/06/2022 4:34:24 PM By: Angelina Pih Signed: 10/06/2022 6:09:56 PM By: Allen Derry PA-C Previous Signature: 10/06/2022 11:27:10 AM Version By: Angelina Pih Entered By: Angelina Pih on 10/06/2022 11:28:40 -------------------------------------------------------------------------------- Problem List Details Patient Name: Date of Service: [name], Jacyln L. 10/06/2022 9:45 A M Medical Record Number: [id number] Patient Account Number: [id number] Date of Birth/Sex: Treating RN: January 21, 1946 (77 y.o. Philbert Riser,  Caitlin Primary Care Provider: Kara Dies Other Clinician: Referring Provider: Treating Provider/Extender: Claris Gower, Charanpreet Weeks in Treatment: 1 Active Problems ICD-10 Encounter Code Description Active Date MDM Diagnosis E11.621 Type 2 diabetes mellitus with foot ulcer 09/29/2022 No Yes L97.512 Non-pressure chronic ulcer of other part of right foot with fat layer exposed 09/29/2022 No Yes I10 Essential (primary) hypertension 09/29/2022 No Yes Scardina, Joannie L ([id number]) [id number] Page 4 of 6 I73.89 Other specified peripheral vascular diseases 09/29/2022 No Yes Z79.84 Long term (current) use of oral hypoglycemic drugs 09/29/2022 No Yes Inactive Problems Resolved Problems Electronic Signature(s) Signed: 10/06/2022 9:34:37 AM By: Allen Derry PA-C Entered By: Allen Derry on 10/06/2022 09:34:37 -------------------------------------------------------------------------------- Progress Note Details Patient Name: Date of Service: [name], [name] 10/06/2022 9:45 A M Medical Record Number: [id number] Patient Account Number:  [id number] Date of Birth/Sex: Treating RN: Jun 26, 1945 (77 y.o. [name] Primary Care Provider: Kara Dies Other Clinician: Referring Provider: Treating Provider/Extender: Claris Gower, Charanpreet Weeks in Treatment: 1 Subjective Chief Complaint Information obtained from Patient Right foot ulcer History of Present Illness (HPI) 09-29-2022 upon evaluation today patient appears to be doing poorly currently in regard to her wound on her right foot medially just at the base of her great toe but more plantar aspect. Unfortunately this has been open since around March 18 according to what the patient tells me although she tells me that she had "a bump there before started opening and draining". Fortunately there does not appear to be any signs of systemic infection though locally this does appear to be quite deep just the one concern I have about the potential for osteomyelitis this is something we can have to keep close eye on and fully evaluate. Fortunately there does not appear to be any signs of active infection locally or systemically which is great news. She does have a issue here with the ABIs she was noncompressible today so we were not able to determine an exact blood flow currently. With that being said she does have seemingly good capillary refill and likely good arterial flow to be able to heal this. Patient does have a history of diabetes mellitus type 2 with a hemoglobin A1c stated to be 6.0 on September 11, 2022. She also has a history of hypertension, peripheral vascular disease, and she is on oral hypoglycemic agents at this point. 10-06-2022 upon evaluation today patient appears to be doing decently well in regard to her wound she is making some progress which is good news. Fortunately there does not appear to be any signs of active infection locally nor systemically which is also great news. No fevers, chills, nausea, vomiting, or  diarrhea. Objective Constitutional Well-nourished and well-hydrated in no acute distress. Vitals Time Taken: 9:41 AM, Height: 63 in, Weight: 126 lbs, BMI: 22.3, Temperature: 97.7 F, Pulse: 85 bpm, Respiratory Rate: 18 breaths/min, Blood Pressure: 163/77 mmHg. Respiratory normal breathing without difficulty. Psychiatric this patient is able to make decisions and demonstrates good insight into disease process. Alert and Oriented x 3. pleasant and cooperative. General Notes: Upon inspection patient's wound bed actually showed signs of good granulation epithelization at this point. There was some need for sharp debridement to remove some of the necrotic debris patient tolerated that today without complication and postdebridement the wound bed appears to be doing much better which is great news. HEYDI, DENNEHY ([id number]) [id number] Page 5 of 6 Integumentary (Hair, Skin) Wound #1 status is Open. Original cause of wound was Gradually Appeared. The  date acquired was: 08/18/2022. The wound has been in treatment 1 weeks. The wound is located on the Right,Medial Foot. The wound measures 0.3cm length x 0.4cm width x 0.2cm depth; 0.094cm^2 area and 0.019cm^3 volume. There is Fat Layer (Subcutaneous Tissue) exposed. There is no tunneling or undermining noted. There is a medium amount of serosanguineous drainage noted. There is small (1-33%) pink granulation within the wound bed. There is a large (67-100%) amount of necrotic tissue within the wound bed including Adherent Slough. Assessment Active Problems ICD-10 Type 2 diabetes mellitus with foot ulcer Non-pressure chronic ulcer of other part of right foot with fat layer exposed Essential (primary) hypertension Other specified peripheral vascular diseases Long term (current) use of oral hypoglycemic drugs Procedures Wound #1 Pre-procedure diagnosis of Wound #1 is a Diabetic Wound/Ulcer of the Lower Extremity located on  the Right,Medial Foot .Severity of Tissue Pre Debridement is: Fat layer exposed. There was a Excisional Skin/Subcutaneous Tissue Debridement with a total area of 0.27 sq cm performed by Allen Derry, PA-C. With the following instrument(s): Curette to remove Viable and Non-Viable tissue/material. Material removed includes Callus, Subcutaneous Tissue, and Slough after achieving pain control using Lidocaine 4% T opical Solution. No specimens were taken. A time out was conducted at 10:15, prior to the start of the procedure. A Moderate amount of bleeding was controlled with Pressure. The procedure was tolerated well. Post Debridement Measurements: 0.5cm length x 0.7cm width x 0.4cm depth; 0.11cm^3 volume. Character of Wound/Ulcer Post Debridement is stable. Severity of Tissue Post Debridement is: Fat layer exposed. Post procedure Diagnosis Wound #1: Same as Pre-Procedure Plan 1. Based on what I am seeing I do think that the patient would benefit from [place] doing an x-ray just to ensure there is nothing significant going on here. I feel like she is making some improvement already but I want to ensure there is no bony destruction underneath indicate osteomyelitis. 2. I am also can recommend the patient should continue to monitor for any signs of infection or worsening. Based on what I am seeing I do believe that we are moving in the right direction but this is still something that is going to have to be monitored very closely going forward. 3. Will get a continue with the [facility] which I think is still probably the best way to go currently. We will see patient back for reevaluation in 1 week here in the clinic. If anything worsens or changes patient will contact our office for additional recommendations. Electronic Signature(s) Signed: 10/06/2022 10:27:41 AM By: Allen Derry PA-C Entered By: Allen Derry on 10/06/2022  10:27:40 -------------------------------------------------------------------------------- SuperBill Details Patient Name: Date of Service: [name], [name] 10/06/2022 Medical Record Number: [id number] Patient Account Number: [id number] Date of Birth/Sex: Treating RN: 04-Nov-1945 (77 y.o. [name] Primary Care Provider: Kara Dies Other Clinician: Referring Provider: Treating Provider/Extender: Claris Gower, Charanpreet Weeks in Treatment: 1 Diagnosis Coding ICD-10 Codes Code Description E11.621 Type 2 diabetes mellitus with foot ulcer L97.512 Non-pressure chronic ulcer of other part of right foot with fat layer exposed I10 Essential (primary) hypertension Jacko, Quenesha L ([id number]) [id number] Page 6 of 6 I73.89 Other specified peripheral vascular diseases Z79.84 Long term (current) use of oral hypoglycemic drugs Facility Procedures : CPT4 Code: [id number] Description: 11042 - DEB SUBQ TISSUE 20 SQ CM/< ICD-10 Diagnosis Description L97.512 Non-pressure chronic ulcer of other part of right foot with fat layer exposed Modifier: Quantity: 1 Physician Procedures : CPT4 Code Description Modifier [id number] 11042 - WC  PHYS SUBQ TISS 20 SQ CM ICD-10 Diagnosis Description L97.512 Non-pressure chronic ulcer of other part of right foot with fat layer exposed Quantity: 1 Electronic Signature(s) Signed: 10/06/2022 11:27:23 AM By: Angelina Pih Signed: 10/06/2022 6:09:56 PM By: Allen Derry PA-C Previous Signature: 10/06/2022 10:34:09 AM Version By: Allen Derry PA-C Entered By: Angelina Pih on 10/06/2022 11:27:23

## 2022-10-06 NOTE — Progress Notes (Addendum)
Hannah [name] ([id number]) [id number] Page 1 of 7 Visit Report for 10/06/2022 Arrival Information Details Patient Name: Date of Service: Hannah [name], Hannah [name] 10/06/2022 9:45 A M Medical Record Number: [id number] Patient Account Number: [id number] Date of Birth/Sex: Treating RN: 10-29-1945 (77 y.o. Hannah [name] Primary Care Loucinda Croy: Kara Dies Other Clinician: Referring Sharlot Sturkey: Treating Estevon Fluke/Extender: Claris Gower, Charanpreet Weeks in Treatment: 1 Visit Information History Since Last Visit Added or deleted any medications: No Patient Arrived: Ambulatory Any new allergies or adverse reactions: No Arrival Time: 09:40 Had a fall or experienced change in No Accompanied By: self activities of daily living that may affect Transfer Assistance: None risk of falls: Patient Identification Verified: Yes Hospitalized since last visit: No Secondary Verification Process Completed: Yes Has Dressing in Place as Prescribed: Yes Patient Has Alerts: Yes Pain Present Now: No Patient Alerts: type 2 diabetic ABI 09/29/22 [name]/R Sigourney Electronic Signature(s) Signed: 10/06/2022 4:34:24 PM By: Angelina Pih Entered By: Angelina Pih on 10/06/2022 09:41:40 -------------------------------------------------------------------------------- Clinic Level of Care Assessment Details Patient Name: Date of Service: Hannah [name], Hannah [name]. 10/06/2022 9:45 A M Medical Record Number: [id number] Patient Account Number: [id number] Date of Birth/Sex: Treating RN: Jun 10, 1945 (77 y.o. Hannah [name] Primary Care Afnan Emberton: Kara Dies Other Clinician: Referring Wyvonne Carda: Treating Taliya Mcclard/Extender: Claris Gower, Charanpreet Weeks in Treatment: 1 Clinic Level of Care Assessment Items TOOL 1 Quantity Score []  - 0 Use when EandM and Procedure is performed on INITIAL visit ASSESSMENTS - Nursing Assessment / Reassessment []  - 0 General Physical Exam (combine w/  comprehensive assessment (listed just below) when performed on new pt. evals) []  - 0 Comprehensive Assessment (HX, ROS, Risk Assessments, Wounds Hx, etc.) ASSESSMENTS - Wound and Skin Assessment / Reassessment []  - 0 Dermatologic / Skin Assessment (not related to wound area) ASSESSMENTS - Ostomy and/or Continence Assessment and Care []  - 0 Incontinence Assessment and Management []  - 0 Ostomy Care Assessment and Management (repouching, etc.) PROCESS - Coordination of Care []  - 0 Simple Patient / Family Education for ongoing care []  - 0 Complex (extensive) Patient / Family Education for ongoing care []  - 0 Staff obtains Chiropractor, Records, T Results / Process Orders est []  - 0 Staff telephones HHA, Nursing Homes / Clarify orders / etc []  - 0 Routine Transfer to another Facility (non-emergent condition) []  - 0 Routine Hospital Admission (non-emergent condition) []  - 0 New Admissions / Insurance Authorizations / Ordering NPWT Apligraf, etc. , []  - 0 Emergency Hospital Admission (emergent condition) PROCESS - Special Needs [place], Hannah [name] ([id number]) [id number] Page 2 of 7 []  - 0 Pediatric / Minor Patient Management []  - 0 Isolation Patient Management []  - 0 Hearing / Language / Visual special needs []  - 0 Assessment of Community assistance (transportation, D/C planning, etc.) []  - 0 Additional assistance / Altered mentation []  - 0 Support Surface(s) Assessment (bed, cushion, seat, etc.) INTERVENTIONS - Miscellaneous []  - 0 External ear exam []  - 0 Patient Transfer (multiple staff / Nurse, adult / Similar devices) []  - 0 Simple Staple / Suture removal (25 or less) []  - 0 Complex Staple / Suture removal (26 or more) []  - 0 Hypo/Hyperglycemic Management (do not check if billed separately) []  - 0 Ankle / Brachial Index (ABI) - do not check if billed separately Has the patient been seen at the hospital within the last three years: Yes Total  Score: 0 Level Of Care: ____ Electronic Signature(s) Signed: 10/06/2022 4:34:24 PM By: Angelina Pih Entered By: Roger Shelter,  Caitlin on 10/06/2022 11:27:16 -------------------------------------------------------------------------------- Encounter Discharge Information Details Patient Name: Date of Service: Hannah [name], Hannah [name] 10/06/2022 9:45 A M Medical Record Number: [id number] Patient Account Number: [id number] Date of Birth/Sex: Treating RN: 1945/08/08 (77 y.o. Hannah [name] Primary Care Leeta Grimme: Kara Dies Other Clinician: Referring Loris Seelye: Treating Marciana Uplinger/Extender: Claris Gower, Charanpreet Weeks in Treatment: 1 Encounter Discharge Information Items Post Procedure Vitals Discharge Condition: Stable Temperature (F): 97.7 Ambulatory Status: Ambulatory Pulse (bpm): 85 Discharge Destination: Home Respiratory Rate (breaths/min): 18 Transportation: Private Auto Blood Pressure (mmHg): 163/77 Accompanied By: self Schedule Follow-up Appointment: Yes Clinical Summary of Care: Electronic Signature(s) Signed: 10/06/2022 11:29:31 AM By: Angelina Pih Entered By: Angelina Pih on 10/06/2022 11:29:31 -------------------------------------------------------------------------------- Lower Extremity Assessment Details Patient Name: Date of Service: Hannah [name], Hannah [name]. 10/06/2022 9:45 A M Medical Record Number: [id number] Patient Account Number: [id number] Date of Birth/Sex: Treating RN: 03/11/1946 (77 y.o. Hannah [name] Primary Care Keone Kamer: Kara Dies Other Clinician: Referring Saharsh Sterling: Treating Kolleen Ochsner/Extender: Claris Gower, Charanpreet Weeks in Treatment: 1 Edema Assessment Assessed: [Left: No] [Right: No] Edema: [Left: N] [Right: o] Calf Wieseler, Hannah [name] ([id number]) [id number] Page 3 of 7 Left: Right: Point of Measurement: 31 cm From Medial Instep 27 cm Ankle Left: Right: Point of Measurement: 9 cm From Medial  Instep 19 cm Vascular Assessment Pulses: Dorsalis Pedis Palpable: [Right:Yes] Posterior Tibial Palpable: [Right:Yes] Electronic Signature(s) Signed: 10/06/2022 4:34:24 PM By: Angelina Pih Entered By: Angelina Pih on 10/06/2022 09:50:36 -------------------------------------------------------------------------------- Multi Wound Chart Details Patient Name: Date of Service: Hannah [name], Shandiin [name]. 10/06/2022 9:45 A M Medical Record Number: [id number] Patient Account Number: [id number] Date of Birth/Sex: Treating RN: 08-Jul-1945 (77 y.o. Hannah [name] Primary Care Cambryn Charters: Kara Dies Other Clinician: Referring Leliana Kontz: Treating Nikolis Berent/Extender: Claris Gower, Charanpreet Weeks in Treatment: 1 Vital Signs Height(in): 63 Pulse(bpm): 85 Weight(lbs): 126 Blood Pressure(mmHg): 163/77 Body Mass Index(BMI): 22.3 Temperature(F): 97.7 Respiratory Rate(breaths/min): 18 [1:Photos:] [N/A:N/A] Right, Medial Foot N/A N/A Wound Location: Gradually Appeared N/A N/A Wounding Event: Diabetic Wound/Ulcer of the Lower N/A N/A Primary Etiology: Extremity Cataracts, Anemia, Hypertension, N/A N/A Comorbid History: Type II Diabetes, Rheumatoid Arthritis 08/18/2022 N/A N/A Date Acquired: 1 N/A N/A Weeks of Treatment: Open N/A N/A Wound Status: No N/A N/A Wound Recurrence: 0.3x0.4x0.2 N/A N/A Measurements [name] x W x D (cm) 0.094 N/A N/A A (cm) : rea 0.019 N/A N/A Volume (cm) : 86.70% N/A N/A % Reduction in A rea: 91.00% N/A N/A % Reduction in Volume: Grade 2 N/A N/A Classification: Medium N/A N/A Exudate A mount: Serosanguineous N/A N/A Exudate Type: red, brown N/A N/A Exudate Color: Small (1-33%) N/A N/A Granulation A mount: Pink N/A N/A Granulation Quality: Large (67-100%) N/A N/A Necrotic A mount: Fat Layer (Subcutaneous Tissue): Yes N/A N/A Exposed Structures: None N/A N/A Epithelialization: Debridement - Excisional N/A  N/A Debridement: Pre-procedure Verification/Time Out 10:15 N/A N/A TakenABERDEEN, MESCALL ([id number]) [id number] Page 4 of 7 Lidocaine 4% Topical Solution N/A N/A Pain Control: Callus, Subcutaneous, Slough N/A N/A Tissue Debrided: Skin/Subcutaneous Tissue N/A N/A Level: 0.27 N/A N/A Debridement A (sq cm): rea Curette N/A N/A Instrument: Moderate N/A N/A Bleeding: Pressure N/A N/A Hemostasis A chieved: Procedure was tolerated well N/A N/A Debridement Treatment Response: 0.5x0.7x0.4 N/A N/A Post Debridement Measurements [name] x W x D (cm) 0.11 N/A N/A Post Debridement Volume: (cm) Debridement N/A N/A Procedures Performed: Treatment Notes Electronic Signature(s) Signed: 10/06/2022 11:26:52 AM By: Angelina Pih Entered By: Angelina Pih on 10/06/2022 11:26:52 -------------------------------------------------------------------------------- Multi-Disciplinary Care Plan  Details Patient Name: Date of Service: Hannah [name], Hannah [name] 10/06/2022 9:45 A M Medical Record Number: [id number] Patient Account Number: [id number] Date of Birth/Sex: Treating RN: 1945/10/26 (77 y.o. Hannah [name] Primary Care Tacuma Graffam: Kara Dies Other Clinician: Referring Malaijah Houchen: Treating Cher Egnor/Extender: Claris Gower, Charanpreet Weeks in Treatment: 1 Active Inactive Necrotic Tissue Nursing Diagnoses: Impaired tissue integrity related to necrotic/devitalized tissue Knowledge deficit related to management of necrotic/devitalized tissue Goals: Necrotic/devitalized tissue will be minimized in the wound bed Date Initiated: 09/29/2022 Target Resolution Date: 10/27/2022 Goal Status: Active Patient/caregiver will verbalize understanding of reason and process for debridement of necrotic tissue Date Initiated: 09/29/2022 Date Inactivated: 09/29/2022 Target Resolution Date: 09/29/2022 Goal Status: Met Interventions: Assess patient pain level pre-, during and post  procedure and prior to discharge Provide education on necrotic tissue and debridement process Treatment Activities: Apply topical anesthetic as ordered : 09/29/2022 Excisional debridement : 09/29/2022 Notes: Wound/Skin Impairment Nursing Diagnoses: Impaired tissue integrity Knowledge deficit related to ulceration/compromised skin integrity Goals: Ulcer/skin breakdown will have a volume reduction of 30% by week 4 Date Initiated: 09/29/2022 Target Resolution Date: 10/27/2022 Goal Status: Active Ulcer/skin breakdown will have a volume reduction of 50% by week 8 Date Initiated: 09/29/2022 Target Resolution Date: 11/24/2022 Goal Status: Active Ulcer/skin breakdown will have a volume reduction of 80% by week 12 Date Initiated: 09/29/2022 Target Resolution Date: 12/22/2022 Goal Status: Active Hannah [name], Hannah [name] ([id number]) [id number] Page 5 of 7 Ulcer/skin breakdown will heal within 14 weeks Date Initiated: 09/29/2022 Target Resolution Date: 01/05/2023 Goal Status: Active Interventions: Assess patient/caregiver ability to obtain necessary supplies Assess patient/caregiver ability to perform ulcer/skin care regimen upon admission and as needed Assess ulceration(s) every visit Provide education on ulcer and skin care Treatment Activities: Skin care regimen initiated : 09/29/2022 Notes: Electronic Signature(s) Signed: 10/06/2022 11:27:37 AM By: Angelina Pih Entered By: Angelina Pih on 10/06/2022 11:27:36 -------------------------------------------------------------------------------- Pain Assessment Details Patient Name: Date of Service: Hannah [name], Hannah [name]. 10/06/2022 9:45 A M Medical Record Number: [id number] Patient Account Number: [id number] Date of Birth/Sex: Treating RN: [birth year] (77 y.o. Hannah [name] Primary Care Houa Ackert: Kara Dies Other Clinician: Referring Ellwood Steidle: Treating Jeffrie Stander/Extender: Claris Gower, Charanpreet Weeks in  Treatment: 1 Active Problems Location of Pain Severity and Description of Pain Patient Has Paino No Site Locations Rate the pain. Current Pain Level: 0 Pain Management and Medication Current Pain Management: Electronic Signature(s) Signed: 10/06/2022 4:34:24 PM By: Angelina Pih Entered By: Angelina Pih on 10/06/2022 09:43:43 -------------------------------------------------------------------------------- Patient/Caregiver Education Details Patient Name: Date of Service: Hannah [name], Hannah [name]. 5/6/2024andnbsp9:45 A M Medical Record Number: [id number] Patient Account Number: [id number] Date of Birth/Gender: Treating RN: 08-14-1945 (77 y.o. Hannah [name] Primary Care Physician: Kara Dies Other Clinician: Referring Physician: Treating Physician/Extender: Claris Gower, Charanpreet Weeks in Treatment: 1 SANNIYAH, DALTO ([id number]) [id number] Page 6 of 7 Education Assessment Education Provided To: Patient Education Topics Provided Wound Debridement: Handouts: Wound Debridement Methods: Explain/Verbal Responses: State content correctly Wound/Skin Impairment: Handouts: Caring for Your Ulcer Methods: Explain/Verbal Responses: State content correctly Electronic Signature(s) Signed: 10/06/2022 4:34:24 PM By: Angelina Pih Entered By: Angelina Pih on 10/06/2022 11:27:53 -------------------------------------------------------------------------------- Wound Assessment Details Patient Name: Date of Service: Hannah [name], Hannah [name]. 10/06/2022 9:45 A M Medical Record Number: [id number] Patient Account Number: [id number] Date of Birth/Sex: Treating RN: 09-14-1945 (77 y.o. Hannah [name] Primary Care Castin Donaghue: Kara Dies Other Clinician: Referring Reason Helzer: Treating Londin Antone/Extender: Claris Gower, Charanpreet Weeks in Treatment: 1 Wound Status Wound Number: 1 Primary Diabetic  Wound/Ulcer of the Lower  Extremity Etiology: Wound Location: Right, Medial Foot Wound Status: Open Wounding Event: Gradually Appeared Comorbid Cataracts, Anemia, Hypertension, Type II Diabetes, Date Acquired: 08/18/2022 History: Rheumatoid Arthritis Weeks Of Treatment: 1 Clustered Wound: No Photos Wound Measurements Length: (cm) 0.3 Width: (cm) 0.4 Depth: (cm) 0.2 Area: (cm) 0.094 Volume: (cm) 0.019 % Reduction in Area: 86.7% % Reduction in Volume: 91% Epithelialization: None Tunneling: No Undermining: No Wound Description Classification: Grade 2 Exudate Amount: Medium Exudate Type: Serosanguineous Exudate Color: red, brown Foul Odor After Cleansing: No Slough/Fibrino Yes Wound Bed Granulation Amount: Small (1-33%) Exposed Structure Granulation Quality: Pink Fat Layer (Subcutaneous Tissue) Exposed: Yes Moan, Hannah [name] ([id number]) [id number] Page 7 of 7 Necrotic Amount: Large (67-100%) Necrotic Quality: Adherent Slough Treatment Notes Wound #1 (Foot) Wound Laterality: Right, Medial Cleanser Byram Ancillary Kit - 15 Day Supply Discharge Instruction: Use supplies as instructed; Kit contains: (15) Saline Bullets; (15) 3x3 Gauze; 15 pr Gloves Soap and Water Discharge Instruction: Gently cleanse wound with antibacterial soap, rinse and pat dry prior to dressing wounds Peri-Wound Care Topical Primary Dressing Hydrofera Blue Ready Transfer Foam, 2.5x2.5 (in/in) Discharge Instruction: Apply Hydrofera Blue Ready to wound bed as directed Secondary Dressing (BORDER) Zetuvit Plus SILICONE BORDER Dressing 4x4 (in/in) Discharge Instruction: Please do not put silicone bordered dressings under wraps. Use non-bordered dressing only. Secured With Compression Wrap Compression Stockings Add-Ons Electronic Signature(s) Signed: 10/06/2022 4:34:24 PM By: Angelina Pih Entered By: Angelina Pih on 10/06/2022  09:49:56 -------------------------------------------------------------------------------- Vitals Details Patient Name: Date of Service: Hannah [name], Hannah [name]. 10/06/2022 9:45 A M Medical Record Number: [id number] Patient Account Number: [id number] Date of Birth/Sex: Treating RN: 03/10/1946 (77 y.o. Hannah [name] Primary Care Briggette Najarian: Kara Dies Other Clinician: Referring Tabari Volkert: Treating Leoda Smithhart/Extender: Claris Gower, Charanpreet Weeks in Treatment: 1 Vital Signs Time Taken: 09:41 Temperature (F): 97.7 Height (in): 63 Pulse (bpm): 85 Weight (lbs): 126 Respiratory Rate (breaths/min): 18 Body Mass Index (BMI): 22.3 Blood Pressure (mmHg): 163/77 Reference Range: 80 - 120 mg / dl Electronic Signature(s) Signed: 10/06/2022 4:34:24 PM By: Angelina Pih Entered By: Angelina Pih on 10/06/2022 09:43:35

## 2022-10-10 ENCOUNTER — Other Ambulatory Visit: Payer: Self-pay | Admitting: Physician Assistant

## 2022-10-10 DIAGNOSIS — L97512 Non-pressure chronic ulcer of other part of right foot with fat layer exposed: Secondary | ICD-10-CM

## 2022-10-13 ENCOUNTER — Encounter: Payer: Medicare PPO | Admitting: Physician Assistant

## 2022-10-13 DIAGNOSIS — E1151 Type 2 diabetes mellitus with diabetic peripheral angiopathy without gangrene: Secondary | ICD-10-CM | POA: Diagnosis not present

## 2022-10-20 ENCOUNTER — Encounter: Payer: Medicare PPO | Admitting: Physician Assistant

## 2022-10-20 DIAGNOSIS — E1151 Type 2 diabetes mellitus with diabetic peripheral angiopathy without gangrene: Secondary | ICD-10-CM | POA: Diagnosis not present

## 2022-10-20 NOTE — Progress Notes (Signed)
ETTIE, PICTON ([id number]) [id number] Page 1 of 1 Visit Report for 10/20/2022 Chief Complaint Document Details Patient Name: Date of Service: [name], [name] 10/20/2022 10:30 A M Medical Record Number: [id number] Patient Account Number: [id number] Date of Birth/Sex: Treating RN: [birth year] (77 y.o. [name] Primary Care Provider: Kara Dies Other Clinician: Referring Provider: Treating Provider/Extender: Claris Gower, Charanpreet Weeks in Treatment: 3 Information Obtained from: Patient Chief Complaint Right foot ulcer Electronic Signature(s) Signed: 10/20/2022 10:30:13 AM By: Allen Derry PA-C Entered By: Allen Derry on 10/20/2022 10:30:13 -------------------------------------------------------------------------------- Problem List Details Patient Name: Date of Service: [name], [name] 10/20/2022 10:30 A M Medical Record Number: [id number] Patient Account Number: [id number] Date of Birth/Sex: Treating RN: November 16, 1945 (77 y.o. [name] Primary Care Provider: Kara Dies Other Clinician: Referring Provider: Treating Provider/Extender: Claris Gower, Charanpreet Weeks in Treatment: 3 Active Problems ICD-10 Encounter Code Description Active Date MDM Diagnosis E11.621 Type 2 diabetes mellitus with foot ulcer 09/29/2022 No Yes L97.512 Non-pressure chronic ulcer of other part of right foot with fat 09/29/2022 No Yes layer exposed I10 Essential (primary) hypertension 09/29/2022 No Yes I73.89 Other specified peripheral vascular diseases 09/29/2022 No Yes Z79.84 Long term (current) use of oral hypoglycemic drugs 09/29/2022 No Yes Inactive Problems Resolved Problems Electronic Signature(s) Signed: 10/20/2022 10:30:10 AM By: Allen Derry PA-C Entered By: Allen Derry on 10/20/2022 10:30:09

## 2022-10-21 ENCOUNTER — Ambulatory Visit
Admission: RE | Admit: 2022-10-21 | Discharge: 2022-10-21 | Disposition: A | Discharge status: Home / Self Care | Payer: Medicare PPO | Source: Ambulatory Visit | Attending: Physician Assistant | Admitting: Physician Assistant

## 2022-10-21 ENCOUNTER — Other Ambulatory Visit: Payer: Self-pay

## 2022-10-21 ENCOUNTER — Telehealth: Payer: Self-pay | Admitting: Nurse Practitioner

## 2022-10-21 DIAGNOSIS — I159 Secondary hypertension, unspecified: Secondary | ICD-10-CM

## 2022-10-21 DIAGNOSIS — E1143 Type 2 diabetes mellitus with diabetic autonomic (poly)neuropathy: Secondary | ICD-10-CM

## 2022-10-21 DIAGNOSIS — L97512 Non-pressure chronic ulcer of other part of right foot with fat layer exposed: Secondary | ICD-10-CM | POA: Diagnosis present

## 2022-10-21 MED ORDER — GADOBUTROL 1 MMOL/ML IV SOLN
5.0000 mL | Freq: Once | INTRAVENOUS | Status: AC | PRN
  Administered 2022-10-21: 5 mL via INTRAVENOUS

## 2022-10-21 MED ORDER — METFORMIN HCL 500 MG PO TABS
ORAL_TABLET | ORAL | 3 refills | Status: DC
Start: 2022-10-21 — End: 2023-09-02

## 2022-10-21 MED ORDER — ATORVASTATIN CALCIUM 10 MG PO TABS
10.0000 mg | ORAL_TABLET | Freq: Every day | ORAL | 3 refills | Status: DC
Start: 2022-10-21 — End: 2023-09-02

## 2022-10-21 NOTE — Telephone Encounter (Signed)
Prescription Request  10/21/2022  LOV: 09/11/2022  What is the name of the medication or equipment? atorvastatin (LIPITOR) 10 MG tablet and Medformin   Have you contacted your pharmacy to request a refill? Yes   Which pharmacy would you like this sent to?    [facility] - [place], [place] - [address] [place] [address] Phone: [phone] Fax: [phone]    Patient notified that their request is being sent to the clinical staff for review and that they should receive a response within 2 business days.   Please advise at Mobile [phone] (mobile)

## 2022-10-28 ENCOUNTER — Encounter: Payer: Medicare PPO | Admitting: Physician Assistant

## 2022-10-28 DIAGNOSIS — E1151 Type 2 diabetes mellitus with diabetic peripheral angiopathy without gangrene: Secondary | ICD-10-CM | POA: Diagnosis not present

## 2022-10-28 NOTE — Progress Notes (Signed)
ASAKO, ALLEGRO ([id number]) [id number] Page 1 of 1 Visit Report for 10/28/2022 Chief Complaint Document Details Patient Name: Date of Service: [name], [name] 10/28/2022 10:00 A M Medical Record Number: [id number] Patient Account Number: [id number] Date of Birth/Sex: Treating RN: [birth year] (76 y.o. Ginette Pitman Primary Care Provider: Kara Dies Other Clinician: Betha Loa Referring Provider: Treating Provider/Extender: Claris Gower, Charanpreet Weeks in Treatment: 4 Information Obtained from: Patient Chief Complaint Right foot ulcer Electronic Signature(s) Signed: 10/28/2022 9:58:23 AM By: Allen Derry PA-C Entered By: Allen Derry on 10/28/2022 09:58:23 -------------------------------------------------------------------------------- Problem List Details Patient Name: Date of Service: [name], [name] 10/28/2022 10:00 A M Medical Record Number: [id number] Patient Account Number: [id number] Date of Birth/Sex: Treating RN: [birth year] (77 y.o. Ginette Pitman Primary Care Provider: Kara Dies Other Clinician: Betha Loa Referring Provider: Treating Provider/Extender: Claris Gower, Charanpreet Weeks in Treatment: 4 Active Problems ICD-10 Encounter Code Description Active Date MDM Diagnosis E11.621 Type 2 diabetes mellitus with foot ulcer 09/29/2022 No Yes L97.512 Non-pressure chronic ulcer of other part of right foot with fat 09/29/2022 No Yes layer exposed I10 Essential (primary) hypertension 09/29/2022 No Yes I73.89 Other specified peripheral vascular diseases 09/29/2022 No Yes Z79.84 Long term (current) use of oral hypoglycemic drugs 09/29/2022 No Yes Inactive Problems Resolved Problems Electronic Signature(s) Signed: 10/28/2022 9:58:20 AM By: Allen Derry PA-C Entered By: Allen Derry on 10/28/2022 09:58:19

## 2022-11-03 ENCOUNTER — Ambulatory Visit: Payer: Medicare PPO | Admitting: Physician Assistant

## 2022-11-04 ENCOUNTER — Encounter: Payer: Medicare PPO | Attending: Physician Assistant | Admitting: Physician Assistant

## 2022-11-04 DIAGNOSIS — L97512 Non-pressure chronic ulcer of other part of right foot with fat layer exposed: Secondary | ICD-10-CM | POA: Insufficient documentation

## 2022-11-04 DIAGNOSIS — I10 Essential (primary) hypertension: Secondary | ICD-10-CM | POA: Diagnosis not present

## 2022-11-04 DIAGNOSIS — Z7984 Long term (current) use of oral hypoglycemic drugs: Secondary | ICD-10-CM | POA: Insufficient documentation

## 2022-11-04 DIAGNOSIS — E11621 Type 2 diabetes mellitus with foot ulcer: Secondary | ICD-10-CM | POA: Diagnosis present

## 2022-11-04 DIAGNOSIS — E1151 Type 2 diabetes mellitus with diabetic peripheral angiopathy without gangrene: Secondary | ICD-10-CM | POA: Diagnosis not present

## 2022-11-11 ENCOUNTER — Encounter: Payer: Medicare PPO | Admitting: Physician Assistant

## 2022-11-11 DIAGNOSIS — E11621 Type 2 diabetes mellitus with foot ulcer: Secondary | ICD-10-CM | POA: Diagnosis not present

## 2022-11-11 NOTE — Progress Notes (Addendum)
[place] ([id number]) [id number] Page 1 of 7 Visit Report for 11/11/2022 Chief Complaint Document Details Patient Name: Date of Service: [place] 11/11/2022 8:45 A M Medical Record Number: [id number] Patient Account Number: [id number] Date of Birth/Sex: Treating RN: [birth year] (77 y.o. Freddy Finner Primary Care Provider: Kara Dies Other Clinician: Referring Provider: Treating Provider/Extender: Claris Gower, Charanpreet Weeks in Treatment: 6 Information Obtained from: Patient Chief Complaint Right foot ulcer Electronic Signature(s) Signed: 11/11/2022 9:21:01 AM By: Allen Derry PA-C Entered By: Allen Derry on 11/11/2022 09:21:01 -------------------------------------------------------------------------------- Debridement Details Patient Name: Date of Service: Hannah RLA ND, [place]. 11/11/2022 8:45 A M Medical Record Number: [id number] Patient Account Number: [id number] Date of Birth/Sex: Treating RN: 09-04-1945 (77 y.o. Freddy Finner Primary Care Provider: Kara Dies Other Clinician: Referring Provider: Treating Provider/Extender: Claris Gower, Charanpreet Weeks in Treatment: 6 Debridement Performed for Assessment: Wound #1 Right,Medial Foot Performed By: Physician Allen Derry, PA-C Debridement Type: Debridement Severity of Tissue Pre Debridement: Fat layer exposed Level of Consciousness (Pre-procedure): Awake and Alert Pre-procedure Verification/Time Out Yes - 09:40 Taken: Start Time: 09:40 Percent of Wound Bed Debrided: 100% T Area Debrided (cm): otal 0.07 Tissue and other material debrided: Viable, Non-Viable, Callus, Subcutaneous, Skin: Dermis , Skin: Epidermis Level: Skin/Subcutaneous Tissue Debridement Description: Excisional Instrument: Curette Bleeding: Minimum Hemostasis Achieved: Pressure End Time: 09:46 Procedural Pain: 0 Post Procedural Pain: 0 [place] ([id number])  [id number] Page 2 of 7 Response to Treatment: Procedure was tolerated well Level of Consciousness (Post- Awake and Alert procedure): Post Debridement Measurements of Total Wound Length: (cm) 0.3 Width: (cm) 0.3 Depth: (cm) 0.1 Volume: (cm) 0.007 Character of Wound/Ulcer Post Debridement: Improved Severity of Tissue Post Debridement: Fat layer exposed Post Procedure Diagnosis Same as Pre-procedure Electronic Signature(s) Signed: 11/11/2022 6:26:43 PM By: Allen Derry PA-C Signed: 11/13/2022 4:13:25 PM By: Yevonne Pax RN Entered By: Yevonne Pax on 11/11/2022 09:46:00 -------------------------------------------------------------------------------- HPI Details Patient Name: Date of Service: Hannah RLA ND, [place]. 11/11/2022 8:45 A M Medical Record Number: [id number] Patient Account Number: [id number] Date of Birth/Sex: Treating RN: 20-Nov-1945 (77 y.o. Freddy Finner Primary Care Provider: Kara Dies Other Clinician: Referring Provider: Treating Provider/Extender: Claris Gower, Charanpreet Weeks in Treatment: 6 History of Present Illness HPI Description: 09-29-2022 upon evaluation today patient appears to be doing poorly currently in regard to her wound on her right foot medially just at the base of her great toe but more plantar aspect. Unfortunately this has been open since around March 18 according to what the patient tells me although she tells me that she had "a bump there before started opening and draining". Fortunately there does not appear to be any signs of systemic infection though locally this does appear to be quite deep just the one concern I have about the potential for osteomyelitis this is something we can have to keep close eye on and fully evaluate. Fortunately there does not appear to be any signs of active infection locally or systemically which is great news. She does have a issue here with the ABIs she was noncompressible today so  we were not able to determine an exact blood flow currently. With that being said she does have seemingly good capillary refill and likely good arterial flow to be able to heal this. Patient does have a history of diabetes mellitus type 2 with a hemoglobin A1c stated to be 6.0 on September 11, 2022. She also has a history of hypertension, peripheral vascular disease, and  she is on oral hypoglycemic agents at this point. 10-06-2022 upon evaluation today patient appears to be doing decently well in regard to her wound she is making some progress which is good news. Fortunately there does not appear to be any signs of active infection locally nor systemically which is also great news. No fevers, chills, nausea, vomiting, or diarrhea. 10-13-2022 upon evaluation today patient appears to be doing well currently with regard to her wound. This is actually showing signs of improvement little by little I do feel like we are getting smaller week by week. Fortunately I do not see any signs of active infection locally or systemically which is great news. No fevers, chills, nausea, vomiting, or diarrhea. 10-20-2022 upon evaluation today patient appears to be doing well currently in regard to her foot ulcer I am actually seeing signs of good improvement I am very pleased with what we are seeing here. Fortunately I do not see any evidence of worsening in general and I think that she is making good progress. 10-28-2022 upon evaluation today patient's wound actually showing signs of getting smaller were getting very close to closure though were not quite there yet. Fortunately I do not see any evidence of infection actively at this time which is great news. No fevers, chills, nausea, vomiting, or diarrhea. 11-04-2022 upon evaluation today patient appears to be doing excellent in regard to her wound her MRI was reviewed and it was negative for any signs of osteomyelitis which is excellent news. There was some reactive changes but  again with a wound present that has not out of the question and extraordinary. With that being said I do think that her wound is measuring much better she is very close to resolution though not completely done yet. 11-11-2022 upon evaluation today patient appears to be doing well currently in regard to her wound. In fact this is showing signs of improvement though she does have some callus I do not think this is completely closed yet but we are getting closer. Electronic Signature(s) Signed: 11/14/2022 7:58:28 AM By: Nettie Elm, Betti [place] ([id number]) AM By: Dionne Ano [id number] Page 3 of 7 Signed: 11/14/2022 7:58:28 Entered By: Allen Derry on 11/14/2022 07:58:28 -------------------------------------------------------------------------------- Physical Exam Details Patient Name: Date of Service: Hannah RLA ND, [place]. 11/11/2022 8:45 A M Medical Record Number: [id number] Patient Account Number: [id number] Date of Birth/Sex: Treating RN: 02-28-1946 (77 y.o. Freddy Finner Primary Care Provider: Kara Dies Other Clinician: Referring Provider: Treating Provider/Extender: Claris Gower, Charanpreet Weeks in Treatment: 6 Constitutional Well-nourished and well-hydrated in no acute distress. Respiratory normal breathing without difficulty. Psychiatric this patient is able to make decisions and demonstrates good insight into disease process. Alert and Oriented x 3. pleasant and cooperative. Notes Upon inspection patient's wound bed actually showed signs of good granulation epithelization at this point. Fortunately I do not see any evidence of active infection locally nor systemically which is great news and overall I am extremely pleased with where we stand currently. Postdebridement this does still have an open wound but looks better. Electronic Signature(s) Signed: 11/14/2022 7:58:49 AM By: Allen Derry PA-C Entered By: Allen Derry on  11/14/2022 07:58:49 -------------------------------------------------------------------------------- Physician Orders Details Patient Name: Date of Service: Hannah RLA ND, [place]. 11/11/2022 8:45 A M Medical Record Number: [id number] Patient Account Number: [id number] Date of Birth/Sex: Treating RN: March 01, 1946 (77 y.o. Freddy Finner Primary Care Provider: Kara Dies Other Clinician: Referring Provider: Treating Provider/Extender: Claris Gower, Charanpreet Weeks in  Treatment: 6 Verbal / Phone Orders: No Diagnosis Coding ICD-10 Coding Code Description E11.621 Type 2 diabetes mellitus with foot ulcer L97.512 Non-pressure chronic ulcer of other part of right foot with fat layer exposed I10 Essential (primary) hypertension Kowaleski, Arletta [place] ([id number]) [id number] Page 4 of 7 I73.89 Other specified peripheral vascular diseases Z79.84 Long term (current) use of oral hypoglycemic drugs Follow-up Appointments Return Appointment in 1 week. Bathing/ Shower/ Hygiene May shower; gently cleanse wound with antibacterial soap, rinse and pat dry prior to dressing wounds No tub bath. Anesthetic (Use 'Patient Medications' Section for Anesthetic Order Entry) Lidocaine applied to wound bed Off-Loading Other: - please wear tennis shoes at this time Wound Treatment Wound #1 - Foot Wound Laterality: Right, Medial Cleanser: Byram Ancillary Kit - 15 Day Supply (Generic) 3 x Per Week/30 Days Discharge Instructions: Use supplies as instructed; Kit contains: (15) Saline Bullets; (15) 3x3 Gauze; 15 pr Gloves Cleanser: Soap and Water 3 x Per Week/30 Days Discharge Instructions: Gently cleanse wound with antibacterial soap, rinse and pat dry prior to dressing wounds Prim Dressing: Prisma 4.34 (in) 3 x Per Week/30 Days ary Discharge Instructions: Moisten w/normal saline or sterile water; Cover wound as directed. Do not remove from wound bed. Secondary Dressing: (BORDER)  Zetuvit Plus SILICONE BORDER Dressing 4x4 (in/in) (Generic) 3 x Per Week/30 Days Discharge Instructions: Please do not put silicone bordered dressings under wraps. Use non-bordered dressing only. Electronic Signature(s) Signed: 11/11/2022 6:26:43 PM By: Allen Derry PA-C Signed: 11/13/2022 4:13:25 PM By: Yevonne Pax RN Entered By: Yevonne Pax on 11/11/2022 09:46:45 -------------------------------------------------------------------------------- Problem List Details Patient Name: Date of Service: Hannah RLA ND, [place]. 11/11/2022 8:45 A M Medical Record Number: [id number] Patient Account Number: [id number] Date of Birth/Sex: Treating RN: 1945-08-24 (77 y.o. Freddy Finner Primary Care Provider: Kara Dies Other Clinician: Referring Provider: Treating Provider/Extender: Claris Gower, Charanpreet Weeks in Treatment: 6 Active Problems ICD-10 Encounter Code Description Active Date MDM Diagnosis E11.621 Type 2 diabetes mellitus with foot ulcer 09/29/2022 No Yes L97.512 Non-pressure chronic ulcer of other part of right foot with fat layer exposed 09/29/2022 No Yes I10 Essential (primary) hypertension 09/29/2022 No Yes Ertle, [place] ([id number]) [id number] Page 5 of 7 I73.89 Other specified peripheral vascular diseases 09/29/2022 No Yes Z79.84 Long term (current) use of oral hypoglycemic drugs 09/29/2022 No Yes Inactive Problems Resolved Problems Electronic Signature(s) Signed: 11/11/2022 9:20:56 AM By: Allen Derry PA-C Entered By: Allen Derry on 11/11/2022 09:20:56 -------------------------------------------------------------------------------- Progress Note Details Patient Name: Date of Service: Hannah RLA ND, [place]. 11/11/2022 8:45 A M Medical Record Number: [id number] Patient Account Number: [id number] Date of Birth/Sex: Treating RN: 01-17-1946 (77 y.o. Freddy Finner Primary Care Provider: Kara Dies Other Clinician: Referring  Provider: Treating Provider/Extender: Claris Gower, Charanpreet Weeks in Treatment: 6 Subjective Chief Complaint Information obtained from Patient Right foot ulcer History of Present Illness (HPI) 09-29-2022 upon evaluation today patient appears to be doing poorly currently in regard to her wound on her right foot medially just at the base of her great toe but more plantar aspect. Unfortunately this has been open since around March 18 according to what the patient tells me although she tells me that she had "a bump there before started opening and draining". Fortunately there does not appear to be any signs of systemic infection though locally this does appear to be quite deep just the one concern I have about the potential for osteomyelitis this is something we can have to keep close eye on and  fully evaluate. Fortunately there does not appear to be any signs of active infection locally or systemically which is great news. She does have a issue here with the ABIs she was noncompressible today so we were not able to determine an exact blood flow currently. With that being said she does have seemingly good capillary refill and likely good arterial flow to be able to heal this. Patient does have a history of diabetes mellitus type 2 with a hemoglobin A1c stated to be 6.0 on September 11, 2022. She also has a history of hypertension, peripheral vascular disease, and she is on oral hypoglycemic agents at this point. 10-06-2022 upon evaluation today patient appears to be doing decently well in regard to her wound she is making some progress which is good news. Fortunately there does not appear to be any signs of active infection locally nor systemically which is also great news. No fevers, chills, nausea, vomiting, or diarrhea. 10-13-2022 upon evaluation today patient appears to be doing well currently with regard to her wound. This is actually showing signs of improvement little by little I do feel like  we are getting smaller week by week. Fortunately I do not see any signs of active infection locally or systemically which is great news. No fevers, chills, nausea, vomiting, or diarrhea. 10-20-2022 upon evaluation today patient appears to be doing well currently in regard to her foot ulcer I am actually seeing signs of good improvement I am very pleased with what we are seeing here. Fortunately I do not see any evidence of worsening in general and I think that she is making good progress. 10-28-2022 upon evaluation today patient's wound actually showing signs of getting smaller were getting very close to closure though were not quite there yet. Fortunately I do not see any evidence of infection actively at this time which is great news. No fevers, chills, nausea, vomiting, or diarrhea. 11-04-2022 upon evaluation today patient appears to be doing excellent in regard to her wound her MRI was reviewed and it was negative for any signs of osteomyelitis which is excellent news. There was some reactive changes but again with a wound present that has not out of the question and extraordinary. With that being said I do think that her wound is measuring much better she is very close to resolution though not completely done yet. 11-11-2022 upon evaluation today patient appears to be doing well currently in regard to her wound. In fact this is showing signs of improvement though she does have some callus I do not think this is completely closed yet but we are getting closer. [place] ([id number]) [id number] Page 6 of 7 Objective Constitutional Well-nourished and well-hydrated in no acute distress. Vitals Time Taken: 8:52 AM, Height: 63 in, Weight: 126 lbs, BMI: 22.3, Temperature: 98.2 F, Pulse: 89 bpm, Respiratory Rate: 18 breaths/min, Blood Pressure: 172/77 mmHg. Respiratory normal breathing without difficulty. Psychiatric this patient is able to make decisions and  demonstrates good insight into disease process. Alert and Oriented x 3. pleasant and cooperative. General Notes: Upon inspection patient's wound bed actually showed signs of good granulation epithelization at this point. Fortunately I do not see any evidence of active infection locally nor systemically which is great news and overall I am extremely pleased with where we stand currently. Postdebridement this does still have an open wound but looks better. Integumentary (Hair, Skin) Wound #1 status is Open. Original cause of wound was Gradually Appeared. The date acquired was: 08/18/2022. The wound  has been in treatment 6 weeks. The wound is located on the Right,Medial Foot. The wound measures 0.1cm length x 0.1cm width x 0.1cm depth; 0.008cm^2 area and 0.001cm^3 volume. There is Fat Layer (Subcutaneous Tissue) exposed. There is no tunneling or undermining noted. There is a medium amount of serosanguineous drainage noted. There is large (67-100%) red, pink granulation within the wound bed. There is no necrotic tissue within the wound bed. Assessment Active Problems ICD-10 Type 2 diabetes mellitus with foot ulcer Non-pressure chronic ulcer of other part of right foot with fat layer exposed Essential (primary) hypertension Other specified peripheral vascular diseases Long term (current) use of oral hypoglycemic drugs Procedures Wound #1 Pre-procedure diagnosis of Wound #1 is a Diabetic Wound/Ulcer of the Lower Extremity located on the Right,Medial Foot .Severity of Tissue Pre Debridement is: Fat layer exposed. There was a Excisional Skin/Subcutaneous Tissue Debridement with a total area of 0.07 sq cm performed by Allen Derry, PA-C. With the following instrument(s): Curette to remove Viable and Non-Viable tissue/material. Material removed includes Callus, Subcutaneous Tissue, Skin: Dermis, and Skin: Epidermis. No specimens were taken. A time out was conducted at 09:40, prior to the start of the  procedure. A Minimum amount of bleeding was controlled with Pressure. The procedure was tolerated well with a pain level of 0 throughout and a pain level of 0 following the procedure. Post Debridement Measurements: 0.3cm length x 0.3cm width x 0.1cm depth; 0.007cm^3 volume. Character of Wound/Ulcer Post Debridement is improved. Severity of Tissue Post Debridement is: Fat layer exposed. Post procedure Diagnosis Wound #1: Same as Pre-Procedure Plan Follow-up Appointments: Return Appointment in 1 week. Bathing/ Shower/ Hygiene: May shower; gently cleanse wound with antibacterial soap, rinse and pat dry prior to dressing wounds No tub bath. Anesthetic (Use 'Patient Medications' Section for Anesthetic Order Entry): Lidocaine applied to wound bed Off-Loading: Other: - please wear tennis shoes at this time WOUND #1: - Foot Wound Laterality: Right, Medial Cleanser: Byram Ancillary Kit - 15 Day Supply (Generic) 3 x Per Week/30 Days Discharge Instructions: Use supplies as instructed; Kit contains: (15) Saline Bullets; (15) 3x3 Gauze; 15 pr Gloves Cleanser: Soap and Water 3 x Per Week/30 Days Discharge Instructions: Gently cleanse wound with antibacterial soap, rinse and pat dry prior to dressing wounds Prim Dressing: Prisma 4.34 (in) 3 x Per Week/30 Days [place] ([id number]) [id number] Page 7 of 7 Discharge Instructions: Moisten w/normal saline or sterile water; Cover wound as directed. Do not remove from wound bed. Secondary Dressing: (BORDER) Zetuvit Plus SILICONE BORDER Dressing 4x4 (in/in) (Generic) 3 x Per Week/30 Days Discharge Instructions: Please do not put silicone bordered dressings under wraps. Use non-bordered dressing only. 1. I recommend that we have the patient continue to monitor for any signs of infection or worsening. Based on what I am seeing I do believe that we are headed in the right direction however. We may continue with the Prisma  followed by the bordered foam dressing. 2. I would recommend continuing appropriate attempt of offloading. Obviously she has been doing very well and I am hopeful she will continue to show signs of improvement we will see where things stand next week at follow-up. We will see patient back for reevaluation in 1 week here in the clinic. If anything worsens or changes patient will contact our office for additional recommendations. Electronic Signature(s) Signed: 11/14/2022 7:59:20 AM By: Allen Derry PA-C Entered By: Allen Derry on 11/14/2022 07:59:20 -------------------------------------------------------------------------------- SuperBill Details Patient Name: Date of Service: Hannah RLA ND,  [place]. 11/11/2022 Medical Record Number: [id number] Patient Account Number: [id number] Date of Birth/Sex: Treating RN: [birth year] (77 y.o. Freddy Finner Primary Care Provider: Kara Dies Other Clinician: Referring Provider: Treating Provider/Extender: Claris Gower, Charanpreet Weeks in Treatment: 6 Diagnosis Coding ICD-10 Codes Code Description E11.621 Type 2 diabetes mellitus with foot ulcer L97.512 Non-pressure chronic ulcer of other part of right foot with fat layer exposed I10 Essential (primary) hypertension I73.89 Other specified peripheral vascular diseases Z79.84 Long term (current) use of oral hypoglycemic drugs Facility Procedures : CPT4 Code: [id number] Description: 11042 - DEB SUBQ TISSUE 20 SQ CM/< ICD-10 Diagnosis Description L97.512 Non-pressure chronic ulcer of other part of right foot with fat layer exposed Modifier: Quantity: 1 Physician Procedures : CPT4 Code Description Modifier [id number] 11042 - WC PHYS SUBQ TISS 20 SQ CM ICD-10 Diagnosis Description L97.512 Non-pressure chronic ulcer of other part of right foot with fat layer exposed Quantity: 1 Electronic Signature(s) Signed: 11/11/2022 6:24:39 PM By: Allen Derry PA-C Entered By: Allen Derry on 11/11/2022 18:24:38

## 2022-11-15 NOTE — Progress Notes (Signed)
CLARIVEL, WESTRUM ([id number]) [id number] Page 1 of 8 Visit Report for 11/11/2022 Arrival Information Details Patient Name: Date of Service: [name], [name] 11/11/2022 8:45 A M Medical Record Number: [id number] Patient Account Number: [id number] Date of Birth/Sex: Treating RN: 12/14/1945 (77 y.o. Freddy Finner Primary Care Moorea Boissonneault: Kara Dies Other Clinician: Referring Eufemio Strahm: Treating Kycen Spalla/Extender: Claris Gower, Charanpreet Weeks in Treatment: 6 Visit Information History Since Last Visit Added or deleted any medications: No Patient Arrived: Ambulatory Any new allergies or adverse reactions: No Arrival Time: 08:48 Had a fall or experienced change in No Accompanied By: self activities of daily living that may affect Transfer Assistance: None risk of falls: Patient Identification Verified: Yes Signs or symptoms of abuse/neglect since last visito No Secondary Verification Process Completed: Yes Hospitalized since last visit: No Patient Has Alerts: Yes Implantable device outside of the clinic excluding No Patient Alerts: type 2 diabetic cellular tissue based products placed in the center ABI 09/29/22 L/R [place] since last visit: Has Dressing in Place as Prescribed: Yes Pain Present Now: No Electronic Signature(s) Signed: 11/13/2022 4:13:25 PM By: Yevonne Pax RN Entered By: Yevonne Pax on 11/11/2022 08:51:58 -------------------------------------------------------------------------------- Clinic Level of Care Assessment Details Patient Name: Date of Service: [name], [name] 11/11/2022 8:45 A M Medical Record Number: [id number] Patient Account Number: [id number] Date of Birth/Sex: Treating RN: Jun 25, 1945 (77 y.o. Freddy Finner Primary Care Nela Bascom: Kara Dies Other Clinician: Referring Darby Fleeman: Treating Analysse Quinonez/Extender: Claris Gower, Charanpreet Weeks in Treatment: 6 Clinic Level of Care Assessment Items TOOL 1  Quantity Score []  - 0 Use when EandM and Procedure is performed on INITIAL visit ASSESSMENTS - Nursing Assessment / Reassessment []  - 0 General Physical Exam (combine w/ comprehensive assessment (listed just below) when performed on new pt. evals) []  - 0 Comprehensive Assessment (HX, ROS, Risk Assessments, Wounds Hx, etc.) Keesey, Raguel L ([id number]) [id number] Page 2 of 8 ASSESSMENTS - Wound and Skin Assessment / Reassessment []  - 0 Dermatologic / Skin Assessment (not related to wound area) ASSESSMENTS - Ostomy and/or Continence Assessment and Care []  - 0 Incontinence Assessment and Management []  - 0 Ostomy Care Assessment and Management (repouching, etc.) PROCESS - Coordination of Care []  - 0 Simple Patient / Family Education for ongoing care []  - 0 Complex (extensive) Patient / Family Education for ongoing care []  - 0 Staff obtains Chiropractor, Records, T Results / Process Orders est []  - 0 Staff telephones HHA, Nursing Homes / Clarify orders / etc []  - 0 Routine Transfer to another Facility (non-emergent condition) []  - 0 Routine Hospital Admission (non-emergent condition) []  - 0 New Admissions / Manufacturing engineer / Ordering NPWT Apligraf, etc. , []  - 0 Emergency Hospital Admission (emergent condition) PROCESS - Special Needs []  - 0 Pediatric / Minor Patient Management []  - 0 Isolation Patient Management []  - 0 Hearing / Language / Visual special needs []  - 0 Assessment of Community assistance (transportation, D/C planning, etc.) []  - 0 Additional assistance / Altered mentation []  - 0 Support Surface(s) Assessment (bed, cushion, seat, etc.) INTERVENTIONS - Miscellaneous []  - 0 External ear exam []  - 0 Patient Transfer (multiple staff / Nurse, adult / Similar devices) []  - 0 Simple Staple / Suture removal (25 or less) []  - 0 Complex Staple / Suture removal (26 or more) []  - 0 Hypo/Hyperglycemic Management (do not check if  billed separately) []  - 0 Ankle / Brachial Index (ABI) - do not check if billed separately Has the patient been  seen at the hospital within the last three years: Yes Total Score: 0 Level Of Care: ____ Electronic Signature(s) Signed: 11/13/2022 4:13:25 PM By: Yevonne Pax RN Entered By: Yevonne Pax on 11/11/2022 09:46:52 -------------------------------------------------------------------------------- Encounter Discharge Information Details Patient Name: Date of Service: [name], [name] 11/11/2022 8:45 A M Medical Record Number: [id number] Patient Account Number: [id number] Date of Birth/Sex: Treating RN: 01-Aug-1945 (77 y.o. Freddy Finner Primary Care Renae Mottley: Kara Dies Other Clinician: Referring Keigan Tafoya: Treating Damontae Loppnow/Extender: Claris Gower, Charanpreet Weeks in Treatment: 6 [place], Katlyn L ([id number]) [id number] Page 3 of 8 Encounter Discharge Information Items Post Procedure Vitals Discharge Condition: Stable Temperature (F): 98.2 Ambulatory Status: Ambulatory Pulse (bpm): 89 Discharge Destination: Home Respiratory Rate (breaths/min): 18 Transportation: Private Auto Blood Pressure (mmHg): 172/77 Accompanied By: self Schedule Follow-up Appointment: Yes Clinical Summary of Care: Electronic Signature(s) Signed: 11/13/2022 4:13:25 PM By: Yevonne Pax RN Entered By: Yevonne Pax on 11/11/2022 09:48:04 -------------------------------------------------------------------------------- Lower Extremity Assessment Details Patient Name: Date of Service: [name], [name] 11/11/2022 8:45 A M Medical Record Number: [id number] Patient Account Number: [id number] Date of Birth/Sex: Treating RN: 03-02-1946 (77 y.o. Freddy Finner Primary Care Flynn Gwyn: Kara Dies Other Clinician: Referring Countess Biebel: Treating Megahn Killings/Extender: Claris Gower, Charanpreet Weeks in Treatment: 6 Electronic Signature(s) Signed: 11/13/2022 4:13:25  PM By: Yevonne Pax RN Entered By: Yevonne Pax on 11/11/2022 08:54:20 -------------------------------------------------------------------------------- Multi Wound Chart Details Patient Name: Date of Service: [name], [name] 11/11/2022 8:45 A M Medical Record Number: [id number] Patient Account Number: [id number] Date of Birth/Sex: Treating RN: [birth year] (77 y.o. Freddy Finner Primary Care Hilman Kissling: Kara Dies Other Clinician: Referring Aasia Peavler: Treating Nolie Bignell/Extender: Claris Gower, Charanpreet Weeks in Treatment: 6 Vital Signs Height(in): 63 Pulse(bpm): 89 Weight(lbs): 126 Blood Pressure(mmHg): 172/77 Body Mass Index(BMI): 22.3 Temperature(F): 98.2 Respiratory Rate(breaths/min): 18 [1:Photos:] Junker, Nikkie L ([id number]) [1:Photos:] [N/A:N/A] Right, Medial Foot N/A N/A Wound Location: Gradually Appeared N/A N/A Wounding Event: Diabetic Wound/Ulcer of the Lower N/A N/A Primary Etiology: Extremity Cataracts, Anemia, Hypertension, N/A N/A Comorbid History: Type II Diabetes, Rheumatoid Arthritis 08/18/2022 N/A N/A Date Acquired: 6 N/A N/A Weeks of Treatment: Open N/A N/A Wound Status: No N/A N/A Wound Recurrence: 0.1x0.1x0.1 N/A N/A Measurements L x W x D (cm) 0.008 N/A N/A A (cm) : rea 0.001 N/A N/A Volume (cm) : 98.90% N/A N/A % Reduction in A rea: 99.50% N/A N/A % Reduction in Volume: Grade 2 N/A N/A Classification: Medium N/A N/A Exudate A mount: Serosanguineous N/A N/A Exudate Type: red, brown N/A N/A Exudate Color: Large (67-100%) N/A N/A Granulation A mount: Red, Pink N/A N/A Granulation Quality: None Present (0%) N/A N/A Necrotic A mount: Fat Layer (Subcutaneous Tissue): Yes N/A N/A Exposed Structures: None N/A N/A Epithelialization: Treatment Notes Electronic Signature(s) Signed: 11/13/2022 4:13:25 PM By: Yevonne Pax RN Entered By: Yevonne Pax on 11/11/2022  09:44:55 -------------------------------------------------------------------------------- Multi-Disciplinary Care Plan Details Patient Name: Date of Service: [name], [name] 11/11/2022 8:45 A M Medical Record Number: [id number] Patient Account Number: [id number] Date of Birth/Sex: Treating RN: 11-May-1946 (77 y.o. Freddy Finner Primary Care Jermani Eberlein: Kara Dies Other Clinician: Referring Yanil Dawe: Treating Kahlani Graber/Extender: Claris Gower, Charanpreet Weeks in Treatment: 6 Active Inactive Wound/Skin Impairment Nursing Diagnoses: Impaired tissue integrity Knowledge deficit related to ulceration/compromised skin integrity Goals: Ulcer/skin breakdown will have a volume reduction of 30% by week 4 Date Initiated: 09/29/2022 Date Inactivated: 11/04/2022 Target Resolution Date: 10/27/2022 Goal Status: JASMIN, STOGDILL L ([id number]) [id number] Page 5 of 8 Ulcer/skin breakdown  will have a volume reduction of 50% by week 8 Date Initiated: 09/29/2022 Target Resolution Date: 11/24/2022 Goal Status: Active Ulcer/skin breakdown will have a volume reduction of 80% by week 12 Date Initiated: 09/29/2022 Target Resolution Date: 12/22/2022 Goal Status: Active Ulcer/skin breakdown will heal within 14 weeks Date Initiated: 09/29/2022 Target Resolution Date: 01/05/2023 Goal Status: Active Interventions: Assess patient/caregiver ability to obtain necessary supplies Assess patient/caregiver ability to perform ulcer/skin care regimen upon admission and as needed Assess ulceration(s) every visit Provide education on ulcer and skin care Treatment Activities: Skin care regimen initiated : 09/29/2022 Notes: Electronic Signature(s) Signed: 11/13/2022 4:13:25 PM By: Yevonne Pax RN Entered By: Yevonne Pax on 11/11/2022 09:47:03 -------------------------------------------------------------------------------- Pain Assessment Details Patient Name: Date of Service: Hannah  RLA Richmond, [name] 11/11/2022 8:45 A M Medical Record Number: [id number] Patient Account Number: [id number] Date of Birth/Sex: Treating RN: Mar 25, 1946 (77 y.o. Freddy Finner Primary Care Raelle Chambers: Kara Dies Other Clinician: Referring Lorene Klimas: Treating Ralphie Lovelady/Extender: Claris Gower, Charanpreet Weeks in Treatment: 6 Active Problems Location of Pain Severity and Description of Pain Patient Has Paino No Site Locations Pain Management and Medication Current Pain Management: [facility], [place] ([id number]) [id number] Page 6 of 8 Electronic Signature(s) Signed: 11/13/2022 4:13:25 PM By: Yevonne Pax RN Entered By: Yevonne Pax on 11/11/2022 08:52:28 -------------------------------------------------------------------------------- Patient/Caregiver Education Details Patient Name: Date of Service: [name], [name] 6/11/2024andnbsp8:45 A M Medical Record Number: [id number] Patient Account Number: [id number] Date of Birth/Gender: Treating RN: 1946-05-23 (77 y.o. Freddy Finner Primary Care Physician: Kara Dies Other Clinician: Referring Physician: Treating Physician/Extender: Lionel December Weeks in Treatment: 6 Education Assessment Education Provided To: Patient Education Topics Provided Wound/Skin Impairment: Handouts: Caring for Your Ulcer Methods: Explain/Verbal Responses: State content correctly Electronic Signature(s) Signed: 11/13/2022 4:13:25 PM By: Yevonne Pax RN Entered By: Yevonne Pax on 11/11/2022 09:47:15 -------------------------------------------------------------------------------- Wound Assessment Details Patient Name: Date of Service: [name], [name] 11/11/2022 8:45 A M Medical Record Number: [id number] Patient Account Number: [id number] Date of Birth/Sex: Treating RN: November 21, 1945 (77 y.o. Freddy Finner Primary Care Cherri Yera: Kara Dies Other Clinician: Referring Dejanay Wamboldt: Treating  Ardra Kuznicki/Extender: Claris Gower, Charanpreet Weeks in Treatment: 6 Wound Status Wound Number: 1 Primary Diabetic Wound/Ulcer of the Lower Extremity Etiology: Wound Location: Right, Medial Foot Wound Status: Open Wounding Event: Gradually Appeared Comorbid Cataracts, Anemia, Hypertension, Type II Diabetes, Date Acquired: 08/18/2022 History: Rheumatoid Arthritis Weeks Of Treatment: 6 Clustered Wound: No Petrak, Lavonya L ([id number]) [id number] Page 7 of 8 Photos Wound Measurements Length: (cm) 0.1 Width: (cm) 0.1 Depth: (cm) 0.1 Area: (cm) 0.008 Volume: (cm) 0.001 % Reduction in Area: 98.9% % Reduction in Volume: 99.5% Epithelialization: None Tunneling: No Undermining: No Wound Description Classification: Grade 2 Exudate Amount: Medium Exudate Type: Serosanguineous Exudate Color: red, brown Foul Odor After Cleansing: No Slough/Fibrino Yes Wound Bed Granulation Amount: Large (67-100%) Exposed Structure Granulation Quality: Red, Pink Fat Layer (Subcutaneous Tissue) Exposed: Yes Necrotic Amount: None Present (0%) Treatment Notes Wound #1 (Foot) Wound Laterality: Right, Medial Cleanser [place]ram Ancillary Kit - 15 Day Supply Discharge Instruction: Use supplies as instructed; Kit contains: (15) Saline Bullets; (15) 3x3 Gauze; 15 pr Gloves Soap and Water Discharge Instruction: Gently cleanse wound with antibacterial soap, rinse and pat dry prior to dressing wounds Peri-Wound Care Topical Primary Dressing Prisma 4.34 (in) Discharge Instruction: Moisten w/normal saline or sterile water; Cover wound as directed. Do not remove from wound bed. Secondary Dressing (BORDER) Zetuvit Plus SILICONE BORDER Dressing 4x4 (in/in) Discharge Instruction: Please do  not put silicone bordered dressings under wraps. Use non-bordered dressing only. Secured With Compression Wrap Compression Stockings Add-Ons Electronic Signature(s) Signed: 11/13/2022 4:13:25 PM  By: Yevonne Pax RN Entered By: Yevonne Pax on 11/11/2022 09:44:46 Kathreen Devoid ([id number]) [id number] Page 8 of 8 -------------------------------------------------------------------------------- Vitals Details Patient Name: Date of Service: [name], Beena L. 11/11/2022 8:45 A M Medical Record Number: [id number] Patient Account Number: [id number] Date of Birth/Sex: Treating RN: April 25, 1946 (77 y.o. Freddy Finner Primary Care Nemiah Bubar: Kara Dies Other Clinician: Referring Stillman Buenger: Treating Travius Crochet/Extender: Claris Gower, Charanpreet Weeks in Treatment: 6 Vital Signs Time Taken: 08:52 Temperature (F): 98.2 Height (in): 63 Pulse (bpm): 89 Weight (lbs): 126 Respiratory Rate (breaths/min): 18 Body Mass Index (BMI): 22.3 Blood Pressure (mmHg): 172/77 Reference Range: 80 - 120 mg / dl Electronic Signature(s) Signed: 11/13/2022 4:13:25 PM By: Yevonne Pax RN Entered By: Yevonne Pax on 11/11/2022 08:52:21

## 2022-11-18 ENCOUNTER — Encounter: Payer: Medicare PPO | Admitting: Physician Assistant

## 2022-11-18 DIAGNOSIS — E11621 Type 2 diabetes mellitus with foot ulcer: Secondary | ICD-10-CM | POA: Diagnosis not present

## 2022-11-18 NOTE — Progress Notes (Signed)
[name] ([id number]) [id number] Page 1 of 8 Visit Report for 11/18/2022 Arrival Information Details Patient Name: Date of Service: [name] 11/18/2022 9:45 A M Medical Record Number: [id number] Patient Account Number: [id number] Date of Birth/Sex: Treating RN: 1945-12-13 (77 y.o. [name] Primary Care Chris Narasimhan: Kara Dies Other Clinician: Referring Khyler Urda: Treating Toini Failla/Extender: Claris Gower, Charanpreet Weeks in Treatment: 7 Visit Information History Since Last Visit Added or deleted any medications: No Patient Arrived: Ambulatory Any new allergies or adverse reactions: No Arrival Time: 09:37 Had a fall or experienced change in No Accompanied By: self activities of daily living that may affect Transfer Assistance: None risk of falls: Patient Identification Verified: Yes Hospitalized since last visit: No Secondary Verification Process Completed: Yes Has Dressing in Place as Prescribed: Yes Patient Has Alerts: Yes Pain Present Now: No Patient Alerts: type 2 diabetic ABI 09/29/22 [place]/[facility] [place] Electronic Signature(s) Signed: 11/18/2022 3:28:11 PM By: Angelina Pih Entered By: Angelina Pih [place] 11/18/2022 09:50:01 -------------------------------------------------------------------------------- Clinic Level of Care Assessment Details Patient Name: Date of Service: [name]. 11/18/2022 9:45 A M Medical Record Number: [id number] Patient Account Number: [id number] Date of Birth/Sex: Treating RN: 1946-03-02 (77 y.o. [name] Primary Care Mackenzye Mackel: Kara Dies Other Clinician: Referring Blessyn [place]: Treating Brielyn Bosak/Extender: Claris Gower, Charanpreet Weeks in Treatment: 7 Clinic Level of Care Assessment Items TOOL 1 Quantity Score []  - 0 Use when EandM and Procedure is performed [place] INITIAL visit ASSESSMENTS - Nursing Assessment / Reassessment []  - 0 General Physical Exam (combine w/  comprehensive assessment (listed just below) when performed [place] new pt. evals) []  - 0 Comprehensive Assessment (HX, ROS, Risk Assessments, Wounds Hx, etc.) ASSESSMENTS - Wound and Skin Assessment / Reassessment []  - 0 Dermatologic / Skin Assessment (not related to wound area) ASSESSMENTS - Ostomy and/or Continence Assessment and Care [name], [name] ([id number]) [id number] Page 2 of 8 []  - 0 Incontinence Assessment and Management []  - 0 Ostomy Care Assessment and Management (repouching, etc.) PROCESS - Coordination of Care []  - 0 Simple Patient / Family Education for ongoing care []  - 0 Complex (extensive) Patient / Family Education for ongoing care []  - 0 Staff obtains Chiropractor, Records, T Results / Process Orders est []  - 0 Staff telephones HHA, Nursing Homes / Clarify orders / etc []  - 0 Routine Transfer to another Facility (non-emergent condition) []  - 0 Routine Hospital Admission (non-emergent condition) []  - 0 New Admissions / Manufacturing engineer / Ordering NPWT Apligraf, etc. , []  - 0 Emergency Hospital Admission (emergent condition) PROCESS - Special Needs []  - 0 Pediatric / Minor Patient Management []  - 0 Isolation Patient Management []  - 0 Hearing / Language / Visual special needs []  - 0 Assessment of Community assistance (transportation, D/C planning, etc.) []  - 0 Additional assistance / Altered mentation []  - 0 Support Surface(s) Assessment (bed, cushion, seat, etc.) INTERVENTIONS - Miscellaneous []  - 0 External ear exam []  - 0 Patient Transfer (multiple staff / Nurse, adult / Similar devices) []  - 0 Simple Staple / Suture removal (25 or less) []  - 0 Complex Staple / Suture removal (26 or more) []  - 0 Hypo/Hyperglycemic Management (do not check if billed separately) []  - 0 Ankle / Brachial Index (ABI) - do not check if billed separately Has the patient been seen at the hospital within the last three years: Yes Total  Score: 0 Level Of Care: ____ Electronic Signature(s) Signed: 11/18/2022 3:28:11 PM By: Angelina Pih Entered By: Roger Shelter,  Caitlin [place] 11/18/2022 10:11:50 -------------------------------------------------------------------------------- Encounter Discharge Information Details Patient Name: Date of Service: [name] 11/18/2022 9:45 A M Medical Record Number: [id number] Patient Account Number: [id number] Date of Birth/Sex: Treating RN: Aug 06, 1945 (77 y.o. [name] Primary Care Issabelle Mcraney: Kara Dies Other Clinician: Referring Johnedward Brodrick: Treating Kamerin Grumbine/Extender: Lionel December Weeks in Treatment: 7 Encounter Discharge Information Items Post Procedure Vitals Discharge Condition: Stable Temperature (F): 97.5 [name] ([id number]) [id number] Page 3 of 8 Ambulatory Status: Ambulatory Pulse (bpm): 88 Discharge Destination: Home Respiratory Rate (breaths/min): 18 Transportation: Private Auto Blood Pressure (mmHg): 139/78 Accompanied By: self Schedule Follow-up Appointment: Yes Clinical Summary of Care: Electronic Signature(s) Signed: 11/18/2022 3:28:11 PM By: Angelina Pih Entered By: Angelina Pih [place] 11/18/2022 10:12:46 -------------------------------------------------------------------------------- Lower Extremity Assessment Details Patient Name: Date of Service: [name]. 11/18/2022 9:45 A M Medical Record Number: [id number] Patient Account Number: [id number] Date of Birth/Sex: Treating RN: 09-26-1945 (77 y.o. [name] Primary Care Delva Derden: Kara Dies Other Clinician: Referring Sylus Stgermain: Treating Felix Meras/Extender: Claris Gower, Charanpreet Weeks in Treatment: 7 Edema Assessment Assessed: [Left: No] [Right: No] Edema: [Left: N] [Right: o] Calf Left: Right: Point of Measurement: 32 cm From Medial Instep 28.4 cm Ankle Left: Right: Point of Measurement: 11 cm From  Medial Instep 18.4 cm Vascular Assessment Pulses: Dorsalis Pedis Palpable: [Right:Yes] Electronic Signature(s) Signed: 11/18/2022 3:28:11 PM By: Angelina Pih Entered By: Angelina Pih [place] 11/18/2022 09:55:10 Multi Wound Chart Details -------------------------------------------------------------------------------- [name] ([id number]) [id number] Page 4 of 8 Patient Name: Date of Service: Hannah RLA ND, [name]. 11/18/2022 9:45 A M Medical Record Number: [id number] Patient Account Number: [id number] Date of Birth/Sex: Treating RN: [birth year] (77 y.o. [name] Primary Care Josefine Fuhr: Kara Dies Other Clinician: Referring Riot Barrick: Treating Saman Umstead/Extender: Claris Gower, Charanpreet Weeks in Treatment: 7 Vital Signs Height(in): 63 Pulse(bpm): 88 Weight(lbs): 126 Blood Pressure(mmHg): 139/78 Body Mass Index(BMI): 22.3 Temperature(F): 97.5 Respiratory Rate(breaths/min): 18 Wound Assessments Wound Number: 1 N/A N/A Photos: N/A N/A Right, Medial Foot N/A N/A Wound Location: Gradually Appeared N/A N/A Wounding Event: Diabetic Wound/Ulcer of the Lower N/A N/A Primary Etiology: Extremity Cataracts, Anemia, Hypertension, N/A N/A Comorbid History: Type II Diabetes, Rheumatoid Arthritis 08/18/2022 N/A N/A Date Acquired: 7 N/A N/A Weeks of Treatment: Open N/A N/A Wound Status: No N/A N/A Wound Recurrence: 0.1x0.1x0.1 N/A N/A Measurements [place] x W x D (cm) 0.008 N/A N/A A (cm) : rea 0.001 N/A N/A Volume (cm) : 98.90% N/A N/A % Reduction in A rea: 99.50% N/A N/A % Reduction in Volume: Grade 2 N/A N/A Classification: None Present N/A N/A Exudate A mount: None Present (0%) N/A N/A Granulation A mount: None Present (0%) N/A N/A Necrotic A mount: Fat Layer (Subcutaneous Tissue): No N/A N/A Exposed Structures: None N/A N/A Epithelialization: Treatment Notes Electronic Signature(s) Signed: 11/18/2022 3:28:11 PM By:  Angelina Pih Entered By: Angelina Pih [place] 11/18/2022 10:09:38 -------------------------------------------------------------------------------- Multi-Disciplinary Care Plan Details Patient Name: Date of Service: Hannah RLA ND, [name]. 11/18/2022 9:45 A M Medical Record Number: [id number] Patient Account Number: [id number] Date of Birth/Sex: Treating RN: [birth year] (77 y.o. [name] Primary Care Amberle Lyter: Kara Dies Other Clinician: Referring Karyna Bessler: Treating Kynzlie Hilleary/Extender: Lionel December Weeks in Treatment: 7 [place], Arrin [place] ([id number]) [id number] Page 5 of 8 Active Inactive Wound/Skin Impairment Nursing Diagnoses: Impaired tissue integrity Knowledge deficit related to ulceration/compromised skin integrity Goals: Ulcer/skin breakdown will have a volume reduction of 30% by week 4 Date Initiated: 09/29/2022 Date Inactivated: 11/04/2022  Target Resolution Date: 10/27/2022 Goal Status: Met Ulcer/skin breakdown will have a volume reduction of 50% by week 8 Date Initiated: 09/29/2022 Target Resolution Date: 11/24/2022 Goal Status: Active Ulcer/skin breakdown will have a volume reduction of 80% by week 12 Date Initiated: 09/29/2022 Target Resolution Date: 12/22/2022 Goal Status: Active Ulcer/skin breakdown will heal within 14 weeks Date Initiated: 09/29/2022 Target Resolution Date: 01/05/2023 Goal Status: Active Interventions: Assess patient/caregiver ability to obtain necessary supplies Assess patient/caregiver ability to perform ulcer/skin care regimen upon admission and as needed Assess ulceration(s) every visit Provide education [place] ulcer and skin care Treatment Activities: Skin care regimen initiated : 09/29/2022 Notes: Electronic Signature(s) Signed: 11/18/2022 3:28:11 PM By: Angelina Pih Entered By: Angelina Pih [place] 11/18/2022  10:12:01 -------------------------------------------------------------------------------- Pain Assessment Details Patient Name: Date of Service: Hannah RLA ND, [name]. 11/18/2022 9:45 A M Medical Record Number: [id number] Patient Account Number: [id number] Date of Birth/Sex: Treating RN: 1946/02/06 (77 y.o. [name] Primary Care Doc Mandala: Kara Dies Other Clinician: Referring Nakyla Bracco: Treating Mandell Pangborn/Extender: Claris Gower, Charanpreet Weeks in Treatment: 7 Active Problems Location of Pain Severity and Description of Pain Patient Has Paino No Site Locations Rate the pain. [name], [name] ([id number]) [id number] Page 6 of 8 Rate the pain. Current Pain Level: 0 Pain Management and Medication Current Pain Management: Electronic Signature(s) Signed: 11/18/2022 3:28:11 PM By: Angelina Pih Entered By: Angelina Pih [place] 11/18/2022 09:50:23 -------------------------------------------------------------------------------- Patient/Caregiver Education Details Patient Name: Date of Service: Hannah RLA ND, [name]. 6/18/2024andnbsp9:45 A M Medical Record Number: [id number] Patient Account Number: [id number] Date of Birth/Gender: Treating RN: 26-Sep-1945 (77 y.o. [name] Primary Care Physician: Kara Dies Other Clinician: Referring Physician: Treating Physician/Extender: Lionel December Weeks in Treatment: 7 Education Assessment Education Provided To: Patient Education Topics Provided Wound Debridement: Handouts: Wound Debridement Methods: Explain/Verbal Responses: State content correctly Wound/Skin Impairment: Handouts: Caring for Your Ulcer Methods: Explain/Verbal Responses: State content correctly Electronic Signature(s) Signed: 11/18/2022 3:28:11 PM By: Angelina Pih Entered By: Angelina Pih [place] 11/18/2022 10:12:12 [name] ([id number]) [id number] Page 7 of  8 -------------------------------------------------------------------------------- Wound Assessment Details Patient Name: Date of Service: Hannah RLA ND, [name]. 11/18/2022 9:45 A M Medical Record Number: [id number] Patient Account Number: [id number] Date of Birth/Sex: Treating RN: 1945/06/07 (77 y.o. [name] Primary Care Givanni Staron: Kara Dies Other Clinician: Referring Huey Scalia: Treating Ayerim Berquist/Extender: Claris Gower, Charanpreet Weeks in Treatment: 7 Wound Status Wound Number: 1 Primary Diabetic Wound/Ulcer of the Lower Extremity Etiology: Wound Location: Right, Medial Foot Wound Status: Open Wounding Event: Gradually Appeared Comorbid Cataracts, Anemia, Hypertension, Type II Diabetes, Date Acquired: 08/18/2022 History: Rheumatoid Arthritis Weeks Of Treatment: 7 Clustered Wound: No Photos Wound Measurements Length: (cm) 0.1 Width: (cm) 0.1 Depth: (cm) 0.1 Area: (cm) 0.008 Volume: (cm) 0.001 % Reduction in Area: 98.9% % Reduction in Volume: 99.5% Epithelialization: None Tunneling: No Undermining: No Wound Description Classification: Grade 2 Exudate Amount: None Present Foul Odor After Cleansing: No Slough/Fibrino Yes Wound Bed Granulation Amount: None Present (0%) Exposed Structure Necrotic Amount: None Present (0%) Fat Layer (Subcutaneous Tissue) Exposed: No Treatment Notes Wound #1 (Foot) Wound Laterality: Right, Medial Cleanser [place] Ancillary Kit - 15 Day Supply Discharge Instruction: Use supplies as instructed; Kit contains: (15) Saline Bullets; (15) 3x3 Gauze; 15 pr Gloves Soap and Water Discharge Instruction: Gently cleanse wound with antibacterial soap, rinse and pat dry prior to dressing wounds Peri-Wound Care Topical Primary Dressing [name], [name] ([id number]) [id number] Page 8 of 8 Silvercel Small 2x2 (in/in) Discharge Instruction: Apply Silvercel  Small 2x2 (in/in) as instructed Secondary  Dressing (BORDER) Zetuvit Plus SILICONE BORDER Dressing 4x4 (in/in) Discharge Instruction: Please do not put silicone bordered dressings under wraps. Use non-bordered dressing only. Secured With Compression Wrap Compression Stockings Add-Ons Electronic Signature(s) Signed: 11/18/2022 3:28:11 PM By: Angelina Pih Entered By: Angelina Pih [place] 11/18/2022 09:54:42 -------------------------------------------------------------------------------- Vitals Details Patient Name: Date of Service: Hannah RLA ND, [name]. 11/18/2022 9:45 A M Medical Record Number: [id number] Patient Account Number: [id number] Date of Birth/Sex: Treating RN: 12-22-1945 (77 y.o. [name] Primary Care Ricahrd Schwager: Kara Dies Other Clinician: Referring Denym Rahimi: Treating Samika Vetsch/Extender: Claris Gower, Charanpreet Weeks in Treatment: 7 Vital Signs Time Taken: 09:50 Temperature (F): 97.5 Height (in): 63 Pulse (bpm): 88 Weight (lbs): 126 Respiratory Rate (breaths/min): 18 Body Mass Index (BMI): 22.3 Blood Pressure (mmHg): 139/78 Reference Range: 80 - 120 mg / dl Electronic Signature(s) Signed: 11/18/2022 3:28:11 PM By: Angelina Pih Entered By: Angelina Pih [place] 11/18/2022 09:50:17

## 2022-11-18 NOTE — Progress Notes (Addendum)
[name], [name] ([id number]) [id number] Page 1 of 7 Visit Report for 11/18/2022 Chief Complaint Document Details Patient Name: Date of Service: [name], [name] 11/18/2022 9:45 A M Medical Record Number: [id number] Patient Account Number: [id number] Date of Birth/Sex: Treating RN: 05-18-1946 (77 y.o. [name] Primary Care Provider: Kara Dies Other Clinician: Referring Provider: Treating Provider/Extender: Claris Gower, Charanpreet Weeks in Treatment: 7 Information Obtained from: Patient Chief Complaint Right foot ulcer Electronic Signature(s) Signed: 11/18/2022 10:07:40 AM By: Allen Derry PA-C Entered By: Allen Derry on 11/18/2022 10:07:40 -------------------------------------------------------------------------------- Debridement Details Patient Name: Date of Service: [name], [name]. 11/18/2022 9:45 A M Medical Record Number: [id number] Patient Account Number: [id number] Date of Birth/Sex: Treating RN: 22-Nov-1945 (77 y.o. [name] Primary Care Provider: Kara Dies Other Clinician: Referring Provider: Treating Provider/Extender: Claris Gower, Charanpreet Weeks in Treatment: 7 Debridement Performed for Assessment: Wound #1 Right,Medial Foot Performed By: Physician Allen Derry, PA-C Debridement Type: Debridement Severity of Tissue Pre Debridement: Limited to breakdown of skin Level of Consciousness (Pre-procedure): Awake and Alert Pre-procedure Verification/Time Out Yes - 10:10 Taken: Pain Control: Lidocaine 4% T opical Solution Percent of Wound Bed Debrided: 100% T Area Debrided (cm): otal 0.07 Tissue and other material debrided: Viable, Non-Viable, Callus, Slough, Subcutaneous, Slough Level: Skin/Subcutaneous Tissue Debridement Description: Excisional Instrument: Curette Bleeding: Minimum Hemostasis Achieved: Pressure Response to Treatment: Procedure was tolerated well Level of Consciousness  (Post- Awake and Alert procedure): [name] ([id number]) [id number] Page 2 of 7 Post Debridement Measurements of Total Wound Length: (cm) 0.3 Width: (cm) 0.3 Depth: (cm) 0.1 Volume: (cm) 0.007 Character of Wound/Ulcer Post Debridement: Stable Severity of Tissue Post Debridement: Fat layer exposed Post Procedure Diagnosis Same as Pre-procedure Electronic Signature(s) Signed: 11/18/2022 3:28:11 PM By: Angelina Pih Signed: 11/20/2022 5:52:14 PM By: Allen Derry PA-C Entered By: Angelina Pih on 11/18/2022 10:11:11 -------------------------------------------------------------------------------- HPI Details Patient Name: Date of Service: [name], [name]. 11/18/2022 9:45 A M Medical Record Number: [id number] Patient Account Number: [id number] Date of Birth/Sex: Treating RN: 02/27/1946 (77 y.o. [name] Primary Care Provider: Kara Dies Other Clinician: Referring Provider: Treating Provider/Extender: Claris Gower, Charanpreet Weeks in Treatment: 7 History of Present Illness HPI Description: 09-29-2022 upon evaluation today patient appears to be doing poorly currently in regard to her wound on her right foot medially just at the base of her great toe but more plantar aspect. Unfortunately this has been open since around March 18 according to what the patient tells me although she tells me that she had "a bump there before started opening and draining". Fortunately there does not appear to be any signs of systemic infection though locally this does appear to be quite deep just the one concern I have about the potential for osteomyelitis this is something we can have to keep close eye on and fully evaluate. Fortunately there does not appear to be any signs of active infection locally or systemically which is great news. She does have a issue here with the ABIs she was noncompressible today so we were not able to determine an exact  blood flow currently. With that being said she does have seemingly good capillary refill and likely good arterial flow to be able to heal this. Patient does have a history of diabetes mellitus type 2 with a hemoglobin A1c stated to be 6.0 on September 11, 2022. She also has a history of hypertension, peripheral vascular disease, and she is on oral hypoglycemic agents at this  point. 10-06-2022 upon evaluation today patient appears to be doing decently well in regard to her wound she is making some progress which is good news. Fortunately there does not appear to be any signs of active infection locally nor systemically which is also great news. No fevers, chills, nausea, vomiting, or diarrhea. 10-13-2022 upon evaluation today patient appears to be doing well currently with regard to her wound. This is actually showing signs of improvement little by little I do feel like we are getting smaller week by week. Fortunately I do not see any signs of active infection locally or systemically which is great news. No fevers, chills, nausea, vomiting, or diarrhea. 10-20-2022 upon evaluation today patient appears to be doing well currently in regard to her foot ulcer I am actually seeing signs of good improvement I am very pleased with what we are seeing here. Fortunately I do not see any evidence of worsening in general and I think that she is making good progress. 10-28-2022 upon evaluation today patient's wound actually showing signs of getting smaller were getting very close to closure though were not quite there yet. Fortunately I do not see any evidence of infection actively at this time which is great news. No fevers, chills, nausea, vomiting, or diarrhea. 11-04-2022 upon evaluation today patient appears to be doing excellent in regard to her wound her MRI was reviewed and it was negative for any signs of osteomyelitis which is excellent news. There was some reactive changes but again with a wound present that has not  out of the question and extraordinary. With that being said I do think that her wound is measuring much better she is very close to resolution though not completely done yet. 11-11-2022 upon evaluation today patient appears to be doing well currently in regard to her wound. In fact this is showing signs of improvement though she does have some callus I do not think this is completely closed yet but we are getting closer. 11-18-2022 upon evaluation today patient appears to be doing well currently in regard to her wound. She has been tolerating the dressing changes without complication. Fortunately there does not appear to be any signs of active infection at this time she has a little bit of callus buildup and need to work on that. Electronic Signature(s) Signed: 11/18/2022 10:15:13 AM By: Nettie Elm, Carmie Richmond ([id number]) [id number] Page 3 of 7 Entered By: Allen Derry on 11/18/2022 10:15:13 -------------------------------------------------------------------------------- Physical Exam Details Patient Name: Date of Service: [name], [name]. 11/18/2022 9:45 A M Medical Record Number: [id number] Patient Account Number: [id number] Date of Birth/Sex: Treating RN: [birth year] (77 y.o. [name] Primary Care Provider: Kara Dies Other Clinician: Referring Provider: Treating Provider/Extender: Claris Gower, Charanpreet Weeks in Treatment: 7 Constitutional Well-nourished and well-hydrated in no acute distress. Respiratory normal breathing without difficulty. Psychiatric this patient is able to make decisions and demonstrates good insight into disease process. Alert and Oriented x 3. pleasant and cooperative. Notes Upon inspection patient's wound bed actually showed signs of good granulation underneath the callus once I cleared this away there is just a very small opening and this actually looks to be doing quite well. I am very pleased I  am hopeful that she may be healed in the next week or 2. Electronic Signature(s) Signed: 11/18/2022 10:16:04 AM By: Allen Derry PA-C Entered By: Allen Derry on 11/18/2022 10:16:04 -------------------------------------------------------------------------------- Physician Orders Details Patient Name: Date of Service: [name], [name]. 11/18/2022 9:45 A  M Medical Record Number: [id number] Patient Account Number: [id number] Date of Birth/Sex: Treating RN: Nov 16, 1945 (77 y.o. [name] Primary Care Provider: Kara Dies Other Clinician: Referring Provider: Treating Provider/Extender: Claris Gower, Charanpreet Weeks in Treatment: 7 Verbal / Phone Orders: No Diagnosis Coding ICD-10 Coding Code Description E11.621 Type 2 diabetes mellitus with foot ulcer L97.512 Non-pressure chronic ulcer of other part of right foot with fat layer exposed I10 Essential (primary) hypertension I73.89 Other specified peripheral vascular diseases [name], [name] ([id number]) [id number] Page 4 of 7 Z79.84 Long term (current) use of oral hypoglycemic drugs Follow-up Appointments Return Appointment in 1 week. Bathing/ Shower/ Hygiene May shower; gently cleanse wound with antibacterial soap, rinse and pat dry prior to dressing wounds No tub bath. Anesthetic (Use 'Patient Medications' Section for Anesthetic Order Entry) Lidocaine applied to wound bed Off-Loading Other: - please wear tennis shoes at this time Wound Treatment Wound #1 - Foot Wound Laterality: Right, Medial Cleanser: Byram Ancillary Kit - 15 Day Supply (Generic) 3 x Per Week/30 Days Discharge Instructions: Use supplies as instructed; Kit contains: (15) Saline Bullets; (15) 3x3 Gauze; 15 pr Gloves Cleanser: Soap and Water 3 x Per Week/30 Days Discharge Instructions: Gently cleanse wound with antibacterial soap, rinse and pat dry prior to dressing wounds Prim Dressing: Silvercel Small 2x2 (in/in) 3 x  Per Week/30 Days ary Discharge Instructions: Apply Silvercel Small 2x2 (in/in) as instructed Secondary Dressing: (BORDER) Zetuvit Plus SILICONE BORDER Dressing 4x4 (in/in) (Generic) 3 x Per Week/30 Days Discharge Instructions: Please do not put silicone bordered dressings under wraps. Use non-bordered dressing only. Electronic Signature(s) Signed: 11/18/2022 3:28:11 PM By: Angelina Pih Signed: 11/20/2022 5:52:14 PM By: Allen Derry PA-C Entered By: Angelina Pih on 11/18/2022 10:11:45 -------------------------------------------------------------------------------- Problem List Details Patient Name: Date of Service: [name], Shavaun Richmond. 11/18/2022 9:45 A M Medical Record Number: [id number] Patient Account Number: [id number] Date of Birth/Sex: Treating RN: Dec 19, 1945 (77 y.o. [name] Primary Care Provider: Kara Dies Other Clinician: Referring Provider: Treating Provider/Extender: Claris Gower, Charanpreet Weeks in Treatment: 7 Active Problems ICD-10 Encounter Code Description Active Date MDM Diagnosis E11.621 Type 2 diabetes mellitus with foot ulcer 09/29/2022 No Yes L97.512 Non-pressure chronic ulcer of other part of right foot with fat layer exposed 09/29/2022 No Yes I10 Essential (primary) hypertension 09/29/2022 No Yes [name], [name] ([id number]) [id number].pdf Page 5 of 7 I73.89 Other specified peripheral vascular diseases 09/29/2022 No Yes Z79.84 Long term (current) use of oral hypoglycemic drugs 09/29/2022 No Yes Inactive Problems Resolved Problems Electronic Signature(s) Signed: 11/18/2022 10:07:33 AM By: Allen Derry PA-C Entered By: Allen Derry on 11/18/2022 10:07:33 -------------------------------------------------------------------------------- Progress Note Details Patient Name: Date of Service: [name], [name]. 11/18/2022 9:45 A M Medical Record Number: [id number] Patient Account Number: [id number] Date of Birth/Sex:  Treating RN: [birth year] (77 y.o. [name] Primary Care Provider: Kara Dies Other Clinician: Referring Provider: Treating Provider/Extender: Claris Gower, Charanpreet Weeks in Treatment: 7 Subjective Chief Complaint Information obtained from Patient Right foot ulcer History of Present Illness (HPI) 09-29-2022 upon evaluation today patient appears to be doing poorly currently in regard to her wound on her right foot medially just at the base of her great toe but more plantar aspect. Unfortunately this has been open since around March 18 according to what the patient tells me although she tells me that she had "a bump there before started opening and draining". Fortunately there does not appear to be any signs of systemic infection though locally this does  appear to be quite deep just the one concern I have about the potential for osteomyelitis this is something we can have to keep close eye on and fully evaluate. Fortunately there does not appear to be any signs of active infection locally or systemically which is great news. She does have a issue here with the ABIs she was noncompressible today so we were not able to determine an exact blood flow currently. With that being said she does have seemingly good capillary refill and likely good arterial flow to be able to heal this. Patient does have a history of diabetes mellitus type 2 with a hemoglobin A1c stated to be 6.0 on September 11, 2022. She also has a history of hypertension, peripheral vascular disease, and she is on oral hypoglycemic agents at this point. 10-06-2022 upon evaluation today patient appears to be doing decently well in regard to her wound she is making some progress which is good news. Fortunately there does not appear to be any signs of active infection locally nor systemically which is also great news. No fevers, chills, nausea, vomiting, or diarrhea. 10-13-2022 upon evaluation today patient appears to be  doing well currently with regard to her wound. This is actually showing signs of improvement little by little I do feel like we are getting smaller week by week. Fortunately I do not see any signs of active infection locally or systemically which is great news. No fevers, chills, nausea, vomiting, or diarrhea. 10-20-2022 upon evaluation today patient appears to be doing well currently in regard to her foot ulcer I am actually seeing signs of good improvement I am very pleased with what we are seeing here. Fortunately I do not see any evidence of worsening in general and I think that she is making good progress. 10-28-2022 upon evaluation today patient's wound actually showing signs of getting smaller were getting very close to closure though were not quite there yet. Fortunately I do not see any evidence of infection actively at this time which is great news. No fevers, chills, nausea, vomiting, or diarrhea. 11-04-2022 upon evaluation today patient appears to be doing excellent in regard to her wound her MRI was reviewed and it was negative for any signs of osteomyelitis which is excellent news. There was some reactive changes but again with a wound present that has not out of the question and extraordinary. With that being said I do think that her wound is measuring much better she is very close to resolution though not completely done yet. 11-11-2022 upon evaluation today patient appears to be doing well currently in regard to her wound. In fact this is showing signs of improvement though she does have some callus I do not think this is completely closed yet but we are getting closer. 11-18-2022 upon evaluation today patient appears to be doing well currently in regard to her wound. She has been tolerating the dressing changes without complication. Fortunately there does not appear to be any signs of active infection at this time she has a little bit of callus buildup and need to work on that. [name], [name] ([id number]) [id number] Page 6 of 7 Objective Constitutional Well-nourished and well-hydrated in no acute distress. Vitals Time Taken: 9:50 AM, Height: 63 in, Weight: 126 lbs, BMI: 22.3, Temperature: 97.5 F, Pulse: 88 bpm, Respiratory Rate: 18 breaths/min, Blood Pressure: 139/78 mmHg. Respiratory normal breathing without difficulty. Psychiatric this patient is able to make decisions and demonstrates good insight into disease process. Alert and Oriented x  3. pleasant and cooperative. General Notes: Upon inspection patient's wound bed actually showed signs of good granulation underneath the callus once I cleared this away there is just a very small opening and this actually looks to be doing quite well. I am very pleased I am hopeful that she may be healed in the next week or 2. Integumentary (Hair, Skin) Wound #1 status is Open. Original cause of wound was Gradually Appeared. The date acquired was: 08/18/2022. The wound has been in treatment 7 weeks. The wound is located on the Right,Medial Foot. The wound measures 0.1cm length x 0.1cm width x 0.1cm depth; 0.008cm^2 area and 0.001cm^3 volume. There is no tunneling or undermining noted. There is a none present amount of drainage noted. There is no granulation within the wound bed. There is no necrotic tissue within the wound bed. Assessment Active Problems ICD-10 Type 2 diabetes mellitus with foot ulcer Non-pressure chronic ulcer of other part of right foot with fat layer exposed Essential (primary) hypertension Other specified peripheral vascular diseases Long term (current) use of oral hypoglycemic drugs Procedures Wound #1 Pre-procedure diagnosis of Wound #1 is a Diabetic Wound/Ulcer of the Lower Extremity located on the Right,Medial Foot .Severity of Tissue Pre Debridement is: Limited to breakdown of skin. There was a Excisional Skin/Subcutaneous Tissue Debridement with a total area of 0.07 sq cm  performed by Allen Derry, PA-C. With the following instrument(s): Curette to remove Viable and Non-Viable tissue/material. Material removed includes Callus, Subcutaneous Tissue, and Slough after achieving pain control using Lidocaine 4% Topical Solution. No specimens were taken. A time out was conducted at 10:10, prior to the start of the procedure. A Minimum amount of bleeding was controlled with Pressure. The procedure was tolerated well. Post Debridement Measurements: 0.3cm length x 0.3cm width x 0.1cm depth; 0.007cm^3 volume. Character of Wound/Ulcer Post Debridement is stable. Severity of Tissue Post Debridement is: Fat layer exposed. Post procedure Diagnosis Wound #1: Same as Pre-Procedure Plan Follow-up Appointments: Return Appointment in 1 week. Bathing/ Shower/ Hygiene: May shower; gently cleanse wound with antibacterial soap, rinse and pat dry prior to dressing wounds No tub bath. Anesthetic (Use 'Patient Medications' Section for Anesthetic Order Entry): Lidocaine applied to wound bed Off-Loading: Other: - please wear tennis shoes at this time WOUND #1: - Foot Wound Laterality: Right, Medial Cleanser: Byram Ancillary Kit - 15 Day Supply (Generic) 3 x Per Week/30 Days Discharge Instructions: Use supplies as instructed; Kit contains: (15) Saline Bullets; (15) 3x3 Gauze; 15 pr Gloves Cleanser: Soap and Water 3 x Per Week/30 Days Discharge Instructions: Gently cleanse wound with antibacterial soap, rinse and pat dry prior to dressing wounds Prim Dressing: Silvercel Small 2x2 (in/in) 3 x Per Week/30 Days [name], [name] ([id number]) [id number] Page 7 of 7 Discharge Instructions: Apply Silvercel Small 2x2 (in/in) as instructed Secondary Dressing: (BORDER) Zetuvit Plus SILICONE BORDER Dressing 4x4 (in/in) (Generic) 3 x Per Week/30 Days Discharge Instructions: Please do not put silicone bordered dressings under wraps. Use non-bordered dressing only. 1.  I would recommend currently that we have the patient continue to monitor for any signs of infection or worsening. Based on what I am seeing I do feel like that we are moving in the right direction which is great news. 2. I am also going to recommend that she should continue to monitor for any evidence of overall worsening and again based on what I am seeing I think that we are making good headway towards complete closure. We will see patient back for  reevaluation in 1 week here in the clinic. If anything worsens or changes patient will contact our office for additional recommendations. Electronic Signature(s) Signed: 11/18/2022 10:16:22 AM By: Allen Derry PA-C Entered By: Allen Derry on 11/18/2022 10:16:21 -------------------------------------------------------------------------------- SuperBill Details Patient Name: Date of Service: [name], Latoiya Richmond. 11/18/2022 Medical Record Number: [id number] Patient Account Number: [id number] Date of Birth/Sex: Treating RN: Mar 06, 1946 (77 y.o. [name] Primary Care Provider: Kara Dies Other Clinician: Referring Provider: Treating Provider/Extender: Claris Gower, Charanpreet Weeks in Treatment: 7 Diagnosis Coding ICD-10 Codes Code Description E11.621 Type 2 diabetes mellitus with foot ulcer L97.512 Non-pressure chronic ulcer of other part of right foot with fat layer exposed I10 Essential (primary) hypertension I73.89 Other specified peripheral vascular diseases Z79.84 Long term (current) use of oral hypoglycemic drugs Facility Procedures : CPT4 Code: [id number] Description: 11042 - DEB SUBQ TISSUE 20 SQ CM/< ICD-10 Diagnosis Description L97.512 Non-pressure chronic ulcer of other part of right foot with fat layer exposed Modifier: Quantity: 1 Physician Procedures : CPT4 Code Description Modifier [id number] 11042 - WC PHYS SUBQ TISS 20 SQ CM ICD-10 Diagnosis Description L97.512 Non-pressure chronic ulcer of other part of right  foot with fat layer exposed Quantity: 1 Electronic Signature(s) Signed: 11/18/2022 10:16:58 AM By: Allen Derry PA-C Entered By: Allen Derry on 11/18/2022 10:16:58

## 2022-11-25 ENCOUNTER — Encounter: Payer: Medicare PPO | Admitting: Physician Assistant

## 2022-11-25 DIAGNOSIS — E11621 Type 2 diabetes mellitus with foot ulcer: Secondary | ICD-10-CM | POA: Diagnosis not present

## 2022-11-25 NOTE — Progress Notes (Signed)
SKYLEEN, BENTLEY ([id number]) [id number] Page 1 of 8 Visit Report for 11/25/2022 Arrival Information Details Patient Name: Date of Service: [name], [name] 11/25/2022 9:45 A M Medical Record Number: [id number] Patient Account Number: [id number] Date of Birth/Sex: Treating RN: Jun 07, 1945 (77 y.o. [name] Primary Care [name]: [name] Other Clinician: Referring [name]: Treating [name]/Extender: Claris Gower, [name] in Treatment: 8 Visit Information History Since Last Visit Added or deleted any medications: No Patient Arrived: Ambulatory Any new allergies or adverse reactions: No Arrival Time: 09:57 Had a fall or experienced change in No Accompanied By: self activities of daily living that may affect Transfer Assistance: None risk of falls: Patient Identification Verified: Yes Hospitalized since last visit: No Secondary Verification Process Completed: Yes Has Dressing in Place as Prescribed: Yes Patient Has Alerts: Yes Pain Present Now: No Patient Alerts: type 2 diabetic ABI 09/29/22 [name] [place] Electronic Signature(s) Signed: 11/25/2022 4:12:56 PM By: [name] Entered By: [name] on 11/25/2022 09:57:45 -------------------------------------------------------------------------------- Clinic Level of Care Assessment Details Patient Name: Date of Service: [name], [name]. 11/25/2022 9:45 A M Medical Record Number: [id number] Patient Account Number: [id number] Date of Birth/Sex: Treating RN: 09-07-1945 (77 y.o. [name] Primary Care [name]: [name] Other Clinician: Referring Koehn Salehi: Treating [name]/Extender: Claris Gower, [name] in Treatment: 8 Clinic Level of Care Assessment Items TOOL 1 Quantity Score []  - 0 Use when EandM and Procedure is performed on INITIAL visit ASSESSMENTS - Nursing Assessment / Reassessment []  - 0 General Physical Exam (combine w/  comprehensive assessment (listed just below) when performed on new pt. evals) []  - 0 Comprehensive Assessment (HX, ROS, Risk Assessments, Wounds Hx, etc.) ASSESSMENTS - Wound and Skin Assessment / Reassessment []  - 0 Dermatologic / Skin Assessment (not related to wound area) ASSESSMENTS - Ostomy and/or Continence Assessment and Care [name], [name] ([id number]) [id number] Page 2 of 8 []  - 0 Incontinence Assessment and Management []  - 0 Ostomy Care Assessment and Management (repouching, etc.) PROCESS - Coordination of Care []  - 0 Simple Patient / Family Education for ongoing care []  - 0 Complex (extensive) Patient / Family Education for ongoing care []  - 0 Staff obtains Chiropractor, Records, T Results / Process Orders est []  - 0 Staff telephones HHA, Nursing Homes / Clarify orders / etc []  - 0 Routine Transfer to another Facility (non-emergent condition) []  - 0 Routine Hospital Admission (non-emergent condition) []  - 0 New Admissions / Manufacturing engineer / Ordering NPWT Apligraf, etc. , []  - 0 Emergency Hospital Admission (emergent condition) PROCESS - Special Needs []  - 0 Pediatric / Minor Patient Management []  - 0 Isolation Patient Management []  - 0 Hearing / Language / Visual special needs []  - 0 Assessment of Community assistance (transportation, D/C planning, etc.) []  - 0 Additional assistance / Altered mentation []  - 0 Support Surface(s) Assessment (bed, cushion, seat, etc.) INTERVENTIONS - Miscellaneous []  - 0 External ear exam []  - 0 Patient Transfer (multiple staff / Nurse, adult / Similar devices) []  - 0 Simple Staple / Suture removal (25 or less) []  - 0 Complex Staple / Suture removal (26 or more) []  - 0 Hypo/Hyperglycemic Management (do not check if billed separately) []  - 0 Ankle / Brachial Index (ABI) - do not check if billed separately Has the patient been seen at the hospital within the last three years: Yes Total  Score: 0 Level Of Care: ____ Electronic Signature(s) Signed: 11/25/2022 4:12:56 PM By: [name] Entered By: [name],  [name] on 11/25/2022 10:17:30 -------------------------------------------------------------------------------- Encounter Discharge Information Details Patient Name: Date of Service: [name], [name] 11/25/2022 9:45 A M Medical Record Number: [id number] Patient Account Number: [id number] Date of Birth/Sex: Treating RN: 09-12-1945 (77 y.o. [name] Primary Care [name]: [name] Other Clinician: Referring Peniel Hass: Treating [name]/Extender: [name] [place] in Treatment: 8 Encounter Discharge Information Items Post Procedure Vitals Discharge Condition: Stable Temperature (F): 98.4 Mccannon, [name] ([id number]) [phone] Page 3 of 8 Ambulatory Status: Ambulatory Pulse (bpm): 80 Discharge Destination: Home Respiratory Rate (breaths/min): 18 Transportation: Private Auto Blood Pressure (mmHg): 153/75 Accompanied By: self Schedule Follow-up Appointment: Yes Clinical Summary of Care: Electronic Signature(s) Signed: 11/25/2022 4:12:56 PM By: [name] Entered By: [name] on 11/25/2022 10:21:05 -------------------------------------------------------------------------------- Lower Extremity Assessment Details Patient Name: Date of Service: [name], [name]. 11/25/2022 9:45 A M Medical Record Number: [id number] Patient Account Number: [id number] Date of Birth/Sex: Treating RN: [birth year] (77 y.o. [name] Primary Care [name]: [name] Other Clinician: Referring [name]: Treating [name]/Extender: Claris Gower, [name] in Treatment: 8 Edema Assessment Assessed: [Left: No] [Right: No] Edema: [Left: N] [Right: o] Calf Left: Right: Point of Measurement: 32 cm From Medial Instep 27.7 cm Ankle Left: Right: Point of Measurement: 11 cm From  Medial Instep 19.2 cm Vascular Assessment Pulses: Dorsalis Pedis Palpable: [Right:Yes] Electronic Signature(s) Signed: 11/25/2022 4:12:56 PM By: [name] Entered By: [name] on 11/25/2022 10:02:25 Multi Wound Chart Details -------------------------------------------------------------------------------- [name] ([id number]) [id number] Page 4 of 8 Patient Name: Date of Service: [name], [name] 11/25/2022 9:45 A M Medical Record Number: [id number] Patient Account Number: [id number] Date of Birth/Sex: Treating RN: 10/07/1945 (77 y.o. [name] Primary Care Bryla Burek: [name] Other Clinician: Referring Cindy Fullman: Treating Allycia Pitz/Extender: Claris Gower, [name] in Treatment: 8 Vital Signs Height(in): 63 Pulse(bpm): 80 Weight(lbs): 126 Blood Pressure(mmHg): 153/75 Body Mass Index(BMI): 22.3 Temperature(F): 98.4 Respiratory Rate(breaths/min): 18 Wound Assessments Wound Number: 1 N/A N/A Photos: N/A N/A Right, Medial Foot N/A N/A Wound Location: Gradually Appeared N/A N/A Wounding Event: Diabetic Wound/Ulcer of the Lower N/A N/A Primary Etiology: Extremity Cataracts, Anemia, Hypertension, N/A N/A Comorbid History: Type II Diabetes, Rheumatoid Arthritis 08/18/2022 N/A N/A Date Acquired: 8 N/A N/A Richmond of Treatment: Open N/A N/A Wound Status: No N/A N/A Wound Recurrence: 0.1x0.1x0.1 N/A N/A Measurements Richmond x W x D (cm) 0.008 N/A N/A A (cm) : rea 0.001 N/A N/A Volume (cm) : 98.90% N/A N/A % Reduction in A rea: 99.50% N/A N/A % Reduction in Volume: Grade 2 N/A N/A Classification: None Present N/A N/A Exudate A mount: None Present (0%) N/A N/A Granulation A mount: None Present (0%) N/A N/A Necrotic A mount: Fat Layer (Subcutaneous Tissue): No N/A N/A Exposed Structures: None N/A N/A Epithelialization: Treatment Notes Electronic Signature(s) Signed: 11/25/2022 4:12:56 PM By:  [name] Entered By: [name] on 11/25/2022 10:17:02 -------------------------------------------------------------------------------- Multi-Disciplinary Care Plan Details Patient Name: Date of Service: [name], Amany Richmond. 11/25/2022 9:45 A M Medical Record Number: [id number] Patient Account Number: [id number] Date of Birth/Sex: Treating RN: 13-Jun-1945 (77 y.o. [name] Primary Care Patricia Fargo: [name] Other Clinician: Referring Anthonella Klausner: Treating Kioni Stahl/Extender: Claris Gower, [name] in Treatment: 8 Lom[place], [name] ([id number]) [id number] Page 5 of 8 Active Inactive Wound/Skin Impairment Nursing Diagnoses: Impaired tissue integrity Knowledge deficit related to ulceration/compromised skin integrity Goals: Ulcer/skin breakdown will have a volume reduction of 30% by week 4 Date Initiated: 09/29/2022 Date Inactivated: 11/04/2022  Target Resolution Date: 10/27/2022 Goal Status: Met Ulcer/skin breakdown will have a volume reduction of 50% by week 8 Date Initiated: 09/29/2022 Date Inactivated: 11/25/2022 Target Resolution Date: 11/24/2022 Goal Status: Met Ulcer/skin breakdown will have a volume reduction of 80% by week 12 Date Initiated: 09/29/2022 Target Resolution Date: 12/22/2022 Goal Status: Active Ulcer/skin breakdown will heal within 14 [place] Date Initiated: 09/29/2022 Target Resolution Date: 01/05/2023 Goal Status: Active Interventions: Assess patient/caregiver ability to obtain necessary supplies Assess patient/caregiver ability to perform ulcer/skin care regimen upon admission and as needed Assess ulceration(s) every visit Provide education on ulcer and skin care Treatment Activities: Skin care regimen initiated : 09/29/2022 Notes: Electronic Signature(s) Signed: 11/25/2022 4:12:56 PM By: [name] Entered By: [name] on 11/25/2022  10:18:59 -------------------------------------------------------------------------------- Pain Assessment Details Patient Name: Date of Service: [name], [name]. 11/25/2022 9:45 A M Medical Record Number: [id number] Patient Account Number: [id number] Date of Birth/Sex: Treating RN: Nov 12, 1945 (77 y.o. [name] Primary Care Aiyla Baucom: [name] Other Clinician: Referring Mohid Furuya: Treating Keshaun Dubey/Extender: Claris Gower, [name] in Treatment: 8 Active Problems Location of Pain Severity and Description of Pain Patient Has Paino No Site Locations Rate the pain. [name], [name] ([id number]) [id number] Page 6 of 8 Rate the pain. Current Pain Level: 0 Pain Management and Medication Current Pain Management: Electronic Signature(s) Signed: 11/25/2022 4:12:56 PM By: [name] Entered By: [name] on 11/25/2022 09:58:07 -------------------------------------------------------------------------------- Patient/Caregiver Education Details Patient Name: Date of Service: [name], [name]. 6/25/2024andnbsp9:45 A M Medical Record Number: [id number] Patient Account Number: [id number] Date of Birth/Gender: Treating RN: 04-20-1946 (77 y.o. [name] Primary Care Physician: [name] Other Clinician: Referring Physician: Treating Physician/Extender: [name] Richmond in Treatment: 8 Education Assessment Education Provided To: Patient Education Topics Provided Wound Debridement: Handouts: Wound Debridement Methods: Explain/Verbal Responses: State content correctly Wound/Skin Impairment: Handouts: Caring for Your Ulcer Methods: Explain/Verbal Responses: State content correctly Electronic Signature(s) Signed: 11/25/2022 4:12:56 PM By: [name] Entered By: [name] on 11/25/2022 10:19:36 [name] ([id number]) [id number] Page 7 of  8 -------------------------------------------------------------------------------- Wound Assessment Details Patient Name: Date of Service: [name], [name] 11/25/2022 9:45 A M Medical Record Number: [id number] Patient Account Number: [id number] Date of Birth/Sex: Treating RN: 1945-07-01 (77 y.o. [name] Primary Care Nayib Remer: [name] Other Clinician: Referring Birdie Fetty: Treating [name]/Extender: Claris Gower, [name] in Treatment: 8 Wound Status Wound Number: 1 Primary Diabetic Wound/Ulcer of the Lower Extremity Etiology: Wound Location: Right, Medial Foot Wound Status: Open Wounding Event: Gradually Appeared Comorbid Cataracts, Anemia, Hypertension, Type II Diabetes, Date Acquired: 08/18/2022 History: Rheumatoid Arthritis Richmond Of Treatment: 8 Clustered Wound: No Photos Wound Measurements Length: (cm) 0.1 Width: (cm) 0.1 Depth: (cm) 0.1 Area: (cm) 0.008 Volume: (cm) 0.001 % Reduction in Area: 98.9% % Reduction in Volume: 99.5% Epithelialization: None Tunneling: No Undermining: No Wound Description Classification: Grade 2 Exudate Amount: None Present Foul Odor After Cleansing: No Slough/Fibrino Yes Wound Bed Granulation Amount: None Present (0%) Exposed Structure Necrotic Amount: None Present (0%) Fat Layer (Subcutaneous Tissue) Exposed: No Treatment Notes Wound #1 (Foot) Wound Laterality: Right, Medial Cleanser [place]ram Ancillary Kit - 15 Day Supply Discharge Instruction: Use supplies as instructed; Kit contains: (15) Saline Bullets; (15) 3x3 Gauze; 15 pr Gloves Soap and Water Discharge Instruction: Gently cleanse wound with antibacterial soap, rinse and pat dry prior to dressing wounds Peri-Wound Care Topical Primary Dressing KEYASHA, MIAH Richmond ([id number]) [id number] Page 8 of 8 Silvercel Small 2x2 (in/in) Discharge  Instruction: Apply Silvercel Small 2x2 (in/in) as instructed Secondary  Dressing (BORDER) Zetuvit Plus SILICONE BORDER Dressing 4x4 (in/in) Discharge Instruction: Please do not put silicone bordered dressings under wraps. Use non-bordered dressing only. Secured With Compression Wrap Compression Stockings Add-Ons Electronic Signature(s) Signed: 11/25/2022 4:12:56 PM By: [name] Entered By: [name] on 11/25/2022 10:02:11 -------------------------------------------------------------------------------- Vitals Details Patient Name: Date of Service: [name], Armeda Richmond. 11/25/2022 9:45 A M Medical Record Number: [id number] Patient Account Number: [id number] Date of Birth/Sex: Treating RN: 05-03-1946 (77 y.o. [name] Primary Care Keileigh Vahey: [name] Other Clinician: Referring Amarionna Arca: Treating Yanette Tripoli/Extender: Claris Gower, [name] in Treatment: 8 Vital Signs Time Taken: 09:57 Temperature (F): 98.4 Height (in): 63 Pulse (bpm): 80 Weight (lbs): 126 Respiratory Rate (breaths/min): 18 Body Mass Index (BMI): 22.3 Blood Pressure (mmHg): 153/75 Reference Range: 80 - 120 mg / dl Electronic Signature(s) Signed: 11/25/2022 4:12:56 PM By: [name] Entered By: [name] on 11/25/2022 09:58:01

## 2022-11-25 NOTE — Progress Notes (Addendum)
[name], [name] ([id number]) [id number] Page 1 of 8 Visit Report for 11/25/2022 Chief Complaint Document Details Patient Name: Date of Service: [name] 11/25/2022 9:45 A M Medical Record Number: [id number] Patient Account Number: [id number] Date of Birth/Sex: Treating RN: [birth year] (77 y.o. [name] Primary Care Provider: Kara Dies Other Clinician: Referring Provider: Treating Provider/Extender: Claris Gower, Charanpreet Weeks in Treatment: 8 Information Obtained from: Patient Chief Complaint Right foot ulcer Electronic Signature(s) Signed: 11/25/2022 10:12:52 AM By: Allen Derry PA-C Entered By: Allen Derry on 11/25/2022 10:12:52 -------------------------------------------------------------------------------- Debridement Details Patient Name: Date of Service: [name]. 11/25/2022 9:45 A M Medical Record Number: [id number] Patient Account Number: [id number] Date of Birth/Sex: Treating RN: 07/30/1945 (77 y.o. [name] Primary Care Provider: Kara Dies Other Clinician: Referring Provider: Treating Provider/Extender: Claris Gower, Charanpreet Weeks in Treatment: 8 Debridement Performed for Assessment: Wound #1 Right,Medial Foot Performed By: Physician Allen Derry, PA-C Debridement Type: Debridement Severity of Tissue Pre Debridement: Limited to breakdown of skin Level of Consciousness (Pre-procedure): Awake and Alert Pre-procedure Verification/Time Out Yes - 10:16 Taken: Pain Control: Lidocaine 2% T opical Liquid Percent of Wound Bed Debrided: 100% T Area Debrided (cm): otal 0.01 Tissue and other material debrided: Viable, Non-Viable, Callus, Slough, Subcutaneous, Slough Level: Skin/Subcutaneous Tissue Debridement Description: Excisional Instrument: Curette Bleeding: Moderate Hemostasis Achieved: Pressure Response to Treatment: Procedure was tolerated well Level of Consciousness  (Post- Awake and Alert procedure): [name], [name] ([id number]) [id number] Page 2 of 8 Post Debridement Measurements of Total Wound Length: (cm) 0.1 Width: (cm) 0.1 Depth: (cm) 0.1 Volume: (cm) 0.001 Character of Wound/Ulcer Post Debridement: Improved Severity of Tissue Post Debridement: Fat layer exposed Post Procedure Diagnosis Same as Pre-procedure Electronic Signature(s) Signed: 11/25/2022 10:23:37 AM By: Allen Derry PA-C Signed: 11/25/2022 4:12:56 PM By: Angelina Pih Entered By: Allen Derry on 11/25/2022 10:23:37 -------------------------------------------------------------------------------- HPI Details Patient Name: Date of Service: Hannah RLA ND, [name]. 11/25/2022 9:45 A M Medical Record Number: [id number] Patient Account Number: [id number] Date of Birth/Sex: Treating RN: [birth year] (77 y.o. [name] Primary Care Provider: Kara Dies Other Clinician: Referring Provider: Treating Provider/Extender: Claris Gower, Charanpreet Weeks in Treatment: 8 History of Present Illness HPI Description: 09-29-2022 upon evaluation today patient appears to be doing poorly currently in regard to her wound on her right foot medially just at the base of her great toe but more plantar aspect. Unfortunately this has been open since around March 18 according to what the patient tells me although she tells me that she had "a bump there before started opening and draining". Fortunately there does not appear to be any signs of systemic infection though locally this does appear to be quite deep just the one concern I have about the potential for osteomyelitis this is something we can have to keep close eye on and fully evaluate. Fortunately there does not appear to be any signs of active infection locally or systemically which is great news. She does have a issue here with the ABIs she was noncompressible today so we were not able to determine an exact  blood flow currently. With that being said she does have seemingly good capillary refill and likely good arterial flow to be able to heal this. Patient does have a history of diabetes mellitus type 2 with a hemoglobin A1c stated to be 6.0 on September 11, 2022. She also has a history of hypertension, peripheral vascular disease, and she is on oral hypoglycemic agents at this  point. 10-06-2022 upon evaluation today patient appears to be doing decently well in regard to her wound she is making some progress which is good news. Fortunately there does not appear to be any signs of active infection locally nor systemically which is also great news. No fevers, chills, nausea, vomiting, or diarrhea. 10-13-2022 upon evaluation today patient appears to be doing well currently with regard to her wound. This is actually showing signs of improvement little by little I do feel like we are getting smaller week by week. Fortunately I do not see any signs of active infection locally or systemically which is great news. No fevers, chills, nausea, vomiting, or diarrhea. 10-20-2022 upon evaluation today patient appears to be doing well currently in regard to her foot ulcer I am actually seeing signs of good improvement I am very pleased with what we are seeing here. Fortunately I do not see any evidence of worsening in general and I think that she is making good progress. 10-28-2022 upon evaluation today patient's wound actually showing signs of getting smaller were getting very close to closure though were not quite there yet. Fortunately I do not see any evidence of infection actively at this time which is great news. No fevers, chills, nausea, vomiting, or diarrhea. 11-04-2022 upon evaluation today patient appears to be doing excellent in regard to her wound her MRI was reviewed and it was negative for any signs of osteomyelitis which is excellent news. There was some reactive changes but again with a wound present that has not  out of the question and extraordinary. With that being said I do think that her wound is measuring much better she is very close to resolution though not completely done yet. 11-11-2022 upon evaluation today patient appears to be doing well currently in regard to her wound. In fact this is showing signs of improvement though she does have some callus I do not think this is completely closed yet but we are getting closer. 11-18-2022 upon evaluation today patient appears to be doing well currently in regard to her wound. She has been tolerating the dressing changes without complication. Fortunately there does not appear to be any signs of active infection at this time she has a little bit of callus buildup and need to work on that. 11-25-2022 upon evaluation today patient's wound still is continuing to progress it seems so close to complete resolution but is not quite done yet. Fortunately I do not see any signs of active infection locally or systemically which is great news. [name], [name] ([id number]) [id number] Page 3 of 8 Electronic Signature(s) Signed: 11/25/2022 10:21:50 AM By: Allen Derry PA-C Entered By: Allen Derry on 11/25/2022 10:21:50 -------------------------------------------------------------------------------- Physical Exam Details Patient Name: Date of Service: Hannah RLA ND, [name]. 11/25/2022 9:45 A M Medical Record Number: [id number] Patient Account Number: [id number] Date of Birth/Sex: Treating RN: [birth year] (77 y.o. [name] Primary Care Provider: Kara Dies Other Clinician: Referring Provider: Treating Provider/Extender: Claris Gower, Charanpreet Weeks in Treatment: 8 Constitutional Well-nourished and well-hydrated in no acute distress. Respiratory normal breathing without difficulty. Psychiatric this patient is able to make decisions and demonstrates good insight into disease process. Alert and Oriented x 3. pleasant and  cooperative. Notes Upon inspection patient's wound bed actually showed signs of good granulation epithelization at this point. Fortunately I do not see any evidence of active infection and she does have some callus buildup. Remove the callus there is just a very tiny opening underneath with the use  of AandD ointment down for the silver cell and see how this does over the next week. I am open this will allow this to stay moist and doing well completely as far as healing is concerned. Electronic Signature(s) Signed: 11/25/2022 10:22:11 AM By: Allen Derry PA-C Entered By: Allen Derry on 11/25/2022 10:22:11 -------------------------------------------------------------------------------- Physician Orders Details Patient Name: Date of Service: Hannah RLA ND, [name]. 11/25/2022 9:45 A M Medical Record Number: [id number] Patient Account Number: [id number] Date of Birth/Sex: Treating RN: Apr 08, 1946 (77 y.o. [name] Primary Care Provider: Kara Dies Other Clinician: Referring Provider: Treating Provider/Extender: Claris Gower, Charanpreet Weeks in Treatment: 8 Verbal / Phone Orders: No Diagnosis Coding ICD-10 Coding Code Description E11.621 Type 2 diabetes mellitus with foot ulcer [place]bury, Meilyn Richmond ([id number]) [id number] Page 4 of 8 L97.512 Non-pressure chronic ulcer of other part of right foot with fat layer exposed I10 Essential (primary) hypertension I73.89 Other specified peripheral vascular diseases Z79.84 Long term (current) use of oral hypoglycemic drugs Follow-up Appointments Return Appointment in 1 week. Bathing/ Shower/ Hygiene May shower; gently cleanse wound with antibacterial soap, rinse and pat dry prior to dressing wounds No tub bath. Anesthetic (Use 'Patient Medications' Section for Anesthetic Order Entry) Lidocaine applied to wound bed Off-Loading Other: - please wear tennis shoes at this time Wound Treatment Wound #1 - Foot  Wound Laterality: Right, Medial Cleanser: Byram Ancillary Kit - 15 Day Supply (Generic) 3 x Per Week/30 Days Discharge Instructions: Use supplies as instructed; Kit contains: (15) Saline Bullets; (15) 3x3 Gauze; 15 pr Gloves Cleanser: Soap and Water 3 x Per Week/30 Days Discharge Instructions: Gently cleanse wound with antibacterial soap, rinse and pat dry prior to dressing wounds Topical: Vitamin AandD Ointment, 4 (oz) tube 3 x Per Week/30 Days Discharge Instructions: place in wound bed prior to silvercell Prim Dressing: Silvercel Small 2x2 (in/in) 3 x Per Week/30 Days ary Discharge Instructions: Apply Silvercel Small 2x2 (in/in) as instructed Secondary Dressing: (BORDER) Zetuvit Plus SILICONE BORDER Dressing 4x4 (in/in) (Generic) 3 x Per Week/30 Days Discharge Instructions: Please do not put silicone bordered dressings under wraps. Use non-bordered dressing only. Electronic Signature(s) Signed: 11/25/2022 4:12:56 PM By: Angelina Pih Signed: 11/25/2022 5:11:00 PM By: Allen Derry PA-C Entered By: Angelina Pih on 11/25/2022 10:20:31 -------------------------------------------------------------------------------- Problem List Details Patient Name: Date of Service: Hannah RLA ND, [name]. 11/25/2022 9:45 A M Medical Record Number: [id number] Patient Account Number: [id number] Date of Birth/Sex: Treating RN: 26-May-1946 (77 y.o. [name] Primary Care Provider: Kara Dies Other Clinician: Referring Provider: Treating Provider/Extender: Claris Gower, Charanpreet Weeks in Treatment: 8 Active Problems ICD-10 Encounter Code Description Active Date MDM Diagnosis E11.621 Type 2 diabetes mellitus with foot ulcer 09/29/2022 No Yes L97.512 Non-pressure chronic ulcer of other part of right foot with fat layer exposed 09/29/2022 No Yes Nez, Keyari Richmond ([id number]) [id number] Page 5 of 8 I10 Essential (primary) hypertension 09/29/2022 No Yes I73.89  Other specified peripheral vascular diseases 09/29/2022 No Yes Z79.84 Long term (current) use of oral hypoglycemic drugs 09/29/2022 No Yes Inactive Problems Resolved Problems Electronic Signature(s) Signed: 11/25/2022 10:12:49 AM By: Allen Derry PA-C Entered By: Allen Derry on 11/25/2022 10:12:49 -------------------------------------------------------------------------------- Progress Note Details Patient Name: Date of Service: Hannah RLA ND, [name]. 11/25/2022 9:45 A M Medical Record Number: [id number] Patient Account Number: [id number] Date of Birth/Sex: Treating RN: 05/25/1946 (77 y.o. [name] Primary Care Provider: Kara Dies Other Clinician: Referring Provider: Treating Provider/Extender: Claris Gower, Charanpreet Weeks in Treatment: 8 Subjective Chief Complaint  Information obtained from Patient Right foot ulcer History of Present Illness (HPI) 09-29-2022 upon evaluation today patient appears to be doing poorly currently in regard to her wound on her right foot medially just at the base of her great toe but more plantar aspect. Unfortunately this has been open since around March 18 according to what the patient tells me although she tells me that she had "a bump there before started opening and draining". Fortunately there does not appear to be any signs of systemic infection though locally this does appear to be quite deep just the one concern I have about the potential for osteomyelitis this is something we can have to keep close eye on and fully evaluate. Fortunately there does not appear to be any signs of active infection locally or systemically which is great news. She does have a issue here with the ABIs she was noncompressible today so we were not able to determine an exact blood flow currently. With that being said she does have seemingly good capillary refill and likely good arterial flow to be able to heal this. Patient does have a history of diabetes  mellitus type 2 with a hemoglobin A1c stated to be 6.0 on September 11, 2022. She also has a history of hypertension, peripheral vascular disease, and she is on oral hypoglycemic agents at this point. 10-06-2022 upon evaluation today patient appears to be doing decently well in regard to her wound she is making some progress which is good news. Fortunately there does not appear to be any signs of active infection locally nor systemically which is also great news. No fevers, chills, nausea, vomiting, or diarrhea. 10-13-2022 upon evaluation today patient appears to be doing well currently with regard to her wound. This is actually showing signs of improvement little by little I do feel like we are getting smaller week by week. Fortunately I do not see any signs of active infection locally or systemically which is great news. No fevers, chills, nausea, vomiting, or diarrhea. 10-20-2022 upon evaluation today patient appears to be doing well currently in regard to her foot ulcer I am actually seeing signs of good improvement I am very pleased with what we are seeing here. Fortunately I do not see any evidence of worsening in general and I think that she is making good progress. 10-28-2022 upon evaluation today patient's wound actually showing signs of getting smaller were getting very close to closure though were not quite there yet. Fortunately I do not see any evidence of infection actively at this time which is great news. No fevers, chills, nausea, vomiting, or diarrhea. 11-04-2022 upon evaluation today patient appears to be doing excellent in regard to her wound her MRI was reviewed and it was negative for any signs of Grater, Dejai Richmond ([id number]) [id number] Page 6 of 8 osteomyelitis which is excellent news. There was some reactive changes but again with a wound present that has not out of the question and extraordinary. With that being said I do think that her wound is measuring  much better she is very close to resolution though not completely done yet. 11-11-2022 upon evaluation today patient appears to be doing well currently in regard to her wound. In fact this is showing signs of improvement though she does have some callus I do not think this is completely closed yet but we are getting closer. 11-18-2022 upon evaluation today patient appears to be doing well currently in regard to her wound. She has been tolerating the dressing  changes without complication. Fortunately there does not appear to be any signs of active infection at this time she has a little bit of callus buildup and need to work on that. 11-25-2022 upon evaluation today patient's wound still is continuing to progress it seems so close to complete resolution but is not quite done yet. Fortunately I do not see any signs of active infection locally or systemically which is great news. Objective Constitutional Well-nourished and well-hydrated in no acute distress. Vitals Time Taken: 9:57 AM, Height: 63 in, Weight: 126 lbs, BMI: 22.3, Temperature: 98.4 F, Pulse: 80 bpm, Respiratory Rate: 18 breaths/min, Blood Pressure: 153/75 mmHg. Respiratory normal breathing without difficulty. Psychiatric this patient is able to make decisions and demonstrates good insight into disease process. Alert and Oriented x 3. pleasant and cooperative. General Notes: Upon inspection patient's wound bed actually showed signs of good granulation epithelization at this point. Fortunately I do not see any evidence of active infection and she does have some callus buildup. Remove the callus there is just a very tiny opening underneath with the use of AandD ointment down for the silver cell and see how this does over the next week. I am open this will allow this to stay moist and doing well completely as far as healing is concerned. Integumentary (Hair, Skin) Wound #1 status is Open. Original cause of wound was Gradually Appeared. The  date acquired was: 08/18/2022. The wound has been in treatment 8 weeks. The wound is located on the Right,Medial Foot. The wound measures 0.1cm length x 0.1cm width x 0.1cm depth; 0.008cm^2 area and 0.001cm^3 volume. There is no tunneling or undermining noted. There is a none present amount of drainage noted. There is no granulation within the wound bed. There is no necrotic tissue within the wound bed. Assessment Active Problems ICD-10 Type 2 diabetes mellitus with foot ulcer Non-pressure chronic ulcer of other part of right foot with fat layer exposed Essential (primary) hypertension Other specified peripheral vascular diseases Long term (current) use of oral hypoglycemic drugs Procedures Wound #1 Pre-procedure diagnosis of Wound #1 is a Diabetic Wound/Ulcer of the Lower Extremity located on the Right,Medial Foot .Severity of Tissue Pre Debridement is: Limited to breakdown of skin. There was a Excisional Skin/Subcutaneous Tissue Debridement with a total area of 0.01 sq cm performed by Allen Derry, PA-C. With the following instrument(s): Curette to remove Viable and Non-Viable tissue/material. Material removed includes Callus, Subcutaneous Tissue, and Slough after achieving pain control using Lidocaine 2% Topical Liquid. No specimens were taken. A time out was conducted at 10:16, prior to the start of the procedure. A Moderate amount of bleeding was controlled with Pressure. The procedure was tolerated well. Post Debridement Measurements: 0.1cm length x 0.1cm width x 0.1cm depth; 0.001cm^3 volume. Character of Wound/Ulcer Post Debridement is improved. Severity of Tissue Post Debridement is: Fat layer exposed. Post procedure Diagnosis Wound #1: Same as Pre-Procedure Plan Follow-up Appointments: Return Appointment in 1 week. Bathing/ Shower/ Hygiene: SIAH, KANNAN ([id number]) [id number] Page 7 of 8 May shower; gently cleanse wound with antibacterial soap,  rinse and pat dry prior to dressing wounds No tub bath. Anesthetic (Use 'Patient Medications' Section for Anesthetic Order Entry): Lidocaine applied to wound bed Off-Loading: Other: - please wear tennis shoes at this time WOUND #1: - Foot Wound Laterality: Right, Medial Cleanser: Byram Ancillary Kit - 15 Day Supply (Generic) 3 x Per Week/30 Days Discharge Instructions: Use supplies as instructed; Kit contains: (15) Saline Bullets; (15) 3x3 Gauze; 15 pr Gloves  Cleanser: Soap and Water 3 x Per Week/30 Days Discharge Instructions: Gently cleanse wound with antibacterial soap, rinse and pat dry prior to dressing wounds Topical: Vitamin AandD Ointment, 4 (oz) tube 3 x Per Week/30 Days Discharge Instructions: place in wound bed prior to silvercell Prim Dressing: Silvercel Small 2x2 (in/in) 3 x Per Week/30 Days ary Discharge Instructions: Apply Silvercel Small 2x2 (in/in) as instructed Secondary Dressing: (BORDER) Zetuvit Plus SILICONE BORDER Dressing 4x4 (in/in) (Generic) 3 x Per Week/30 Days Discharge Instructions: Please do not put silicone bordered dressings under wraps. Use non-bordered dressing only. 1. I am going to recommend that we have the patient continue to monitor for any signs of infection or worsening. Based on what I am seeing I do believe that we are moving in the right direction. 2. I am also going to recommend that the patient should continue to utilize the silver alginate dressing that the use of AandD ointment underneath. 3. I am also can recommend that we should continue with the appropriate offloading she has been doing what she can to stay off of this is much as possible. We will see patient back for reevaluation in 1 week here in the clinic. If anything worsens or changes patient will contact our office for additional recommendations. Electronic Signature(s) Signed: 11/25/2022 10:23:48 AM By: Allen Derry PA-C Previous Signature: 11/25/2022 10:22:50 AM Version By: Allen Derry  PA-C Entered By: Allen Derry on 11/25/2022 10:23:47 -------------------------------------------------------------------------------- SuperBill Details Patient Name: Date of Service: Hannah RLA ND, [name]. 11/25/2022 Medical Record Number: [id number] Patient Account Number: [id number] Date of Birth/Sex: Treating RN: 04/28/1946 (77 y.o. [name] Primary Care Provider: Kara Dies Other Clinician: Referring Provider: Treating Provider/Extender: Claris Gower, Charanpreet Weeks in Treatment: 8 Diagnosis Coding ICD-10 Codes Code Description E11.621 Type 2 diabetes mellitus with foot ulcer L97.512 Non-pressure chronic ulcer of other part of right foot with fat layer exposed I10 Essential (primary) hypertension I73.89 Other specified peripheral vascular diseases Z79.84 Long term (current) use of oral hypoglycemic drugs Facility Procedures : CPT4 Code: [id number] Description: 11042 - DEB SUBQ TISSUE 20 SQ CM/< ICD-10 Diagnosis Description L97.512 Non-pressure chronic ulcer of other part of right foot with fat layer exposed Modifier: Quantity: 1 Physician Procedures : CPT4 Code Description Modifier [id number] 11042 - WC PHYS SUBQ TISS 20 SQ CM [place], Aerie Richmond ([id number]) [id number] Pag Quantity: 1 e 8 of 8 : ICD-10 Diagnosis Description L97.512 Non-pressure chronic ulcer of other part of right foot with fat layer exposed Quantity: Electronic Signature(s) Signed: 11/25/2022 10:24:11 AM By: Allen Derry PA-C Entered By: Allen Derry on 11/25/2022 10:24:10

## 2022-12-02 ENCOUNTER — Encounter: Payer: Medicare PPO | Attending: Physician Assistant | Admitting: Physician Assistant

## 2022-12-02 DIAGNOSIS — M069 Rheumatoid arthritis, unspecified: Secondary | ICD-10-CM | POA: Insufficient documentation

## 2022-12-02 DIAGNOSIS — E1151 Type 2 diabetes mellitus with diabetic peripheral angiopathy without gangrene: Secondary | ICD-10-CM | POA: Diagnosis not present

## 2022-12-02 DIAGNOSIS — E11621 Type 2 diabetes mellitus with foot ulcer: Secondary | ICD-10-CM | POA: Insufficient documentation

## 2022-12-02 DIAGNOSIS — L97512 Non-pressure chronic ulcer of other part of right foot with fat layer exposed: Secondary | ICD-10-CM | POA: Insufficient documentation

## 2022-12-02 DIAGNOSIS — Z7984 Long term (current) use of oral hypoglycemic drugs: Secondary | ICD-10-CM | POA: Insufficient documentation

## 2022-12-02 DIAGNOSIS — I10 Essential (primary) hypertension: Secondary | ICD-10-CM | POA: Diagnosis not present

## 2022-12-02 NOTE — Progress Notes (Addendum)
[name], [name] ([id number]) [id number] Page 1 of 8 Visit Report for 12/02/2022 Chief Complaint Document Details Patient Name: Date of Service: [name], [name] 12/02/2022 9:45 A M Medical Record Number: [id number] Patient Account Number: [id number] Date of Birth/Sex: Treating RN: 08-Mar-1946 (77 y.o. [name] Primary Care Provider: Kara Dies Other Clinician: Referring Provider: Treating Provider/Extender: Claris Gower, Charanpreet Weeks in Treatment: 9 Information Obtained from: Patient Chief Complaint Right foot ulcer Electronic Signature(s) Signed: 12/02/2022 9:44:12 AM By: Allen Derry PA-C Entered By: Allen Derry on 12/02/2022 09:44:12 -------------------------------------------------------------------------------- Debridement Details Patient Name: Date of Service: [name], [name]. 12/02/2022 9:45 A M Medical Record Number: [id number] Patient Account Number: [id number] Date of Birth/Sex: Treating RN: 1945-12-27 (77 y.o. [name] Primary Care Provider: Kara Dies Other Clinician: Referring Provider: Treating Provider/Extender: Claris Gower, Charanpreet Weeks in Treatment: 9 Debridement Performed for Assessment: Wound #1 Right,Medial Foot Performed By: Physician Allen Derry, PA-C Debridement Type: Debridement Severity of Tissue Pre Debridement: Limited to breakdown of skin Level of Consciousness (Pre-procedure): Awake and Alert Pre-procedure Verification/Time Out Yes - 10:06 Taken: Pain Control: Lidocaine 4% T opical Solution Percent of Wound Bed Debrided: 100% T Area Debrided (cm): otal 0.01 Tissue and other material debrided: Viable, Non-Viable, Callus Level: Non-Viable Tissue Debridement Description: Selective/Open Wound Instrument: Curette Bleeding: Minimum Hemostasis Achieved: Pressure Response to Treatment: Procedure was tolerated well Level of Consciousness (Post- Awake and  Alert procedure): Beretta, [name] ([id number]) [id number] Page 2 of 8 Post Debridement Measurements of Total Wound Length: (cm) 0.1 Width: (cm) 0.1 Depth: (cm) 0.1 Volume: (cm) 0.001 Character of Wound/Ulcer Post Debridement: Stable Severity of Tissue Post Debridement: Fat layer exposed Post Procedure Diagnosis Same as Pre-procedure Electronic Signature(s) Signed: 12/02/2022 4:14:56 PM By: Angelina Pih Signed: 12/02/2022 5:13:07 PM By: Allen Derry PA-C Entered By: Angelina Pih on 12/02/2022 10:10:28 -------------------------------------------------------------------------------- HPI Details Patient Name: Date of Service: [name], Ethelwyn Richmond. 12/02/2022 9:45 A M Medical Record Number: [id number] Patient Account Number: [id number] Date of Birth/Sex: Treating RN: 09-25-1945 (77 y.o. [name] Primary Care Provider: Kara Dies Other Clinician: Referring Provider: Treating Provider/Extender: Claris Gower, Charanpreet Weeks in Treatment: 9 History of Present Illness HPI Description: 09-29-2022 upon evaluation today patient appears to be doing poorly currently in regard to her wound on her right foot medially just at the base of her great toe but more plantar aspect. Unfortunately this has been open since around March 18 according to what the patient tells me although she tells me that she had "a bump there before started opening and draining". Fortunately there does not appear to be any signs of systemic infection though locally this does appear to be quite deep just the one concern I have about the potential for osteomyelitis this is something we can have to keep close eye on and fully evaluate. Fortunately there does not appear to be any signs of active infection locally or systemically which is great news. She does have a issue here with the ABIs she was noncompressible today so we were not able to determine an exact blood flow currently.  With that being said she does have seemingly good capillary refill and likely good arterial flow to be able to heal this. Patient does have a history of diabetes mellitus type 2 with a hemoglobin A1c stated to be 6.0 on September 11, 2022. She also has a history of hypertension, peripheral vascular disease, and she is on oral hypoglycemic agents at this point. 10-06-2022  upon evaluation today patient appears to be doing decently well in regard to her wound she is making some progress which is good news. Fortunately there does not appear to be any signs of active infection locally nor systemically which is also great news. No fevers, chills, nausea, vomiting, or diarrhea. 10-13-2022 upon evaluation today patient appears to be doing well currently with regard to her wound. This is actually showing signs of improvement little by little I do feel like we are getting smaller week by week. Fortunately I do not see any signs of active infection locally or systemically which is great news. No fevers, chills, nausea, vomiting, or diarrhea. 10-20-2022 upon evaluation today patient appears to be doing well currently in regard to her foot ulcer I am actually seeing signs of good improvement I am very pleased with what we are seeing here. Fortunately I do not see any evidence of worsening in general and I think that she is making good progress. 10-28-2022 upon evaluation today patient's wound actually showing signs of getting smaller were getting very close to closure though were not quite there yet. Fortunately I do not see any evidence of infection actively at this time which is great news. No fevers, chills, nausea, vomiting, or diarrhea. 11-04-2022 upon evaluation today patient appears to be doing excellent in regard to her wound her MRI was reviewed and it was negative for any signs of osteomyelitis which is excellent news. There was some reactive changes but again with a wound present that has not out of the question  and extraordinary. With that being said I do think that her wound is measuring much better she is very close to resolution though not completely done yet. 11-11-2022 upon evaluation today patient appears to be doing well currently in regard to her wound. In fact this is showing signs of improvement though she does have some callus I do not think this is completely closed yet but we are getting closer. 11-18-2022 upon evaluation today patient appears to be doing well currently in regard to her wound. She has been tolerating the dressing changes without complication. Fortunately there does not appear to be any signs of active infection at this time she has a little bit of callus buildup and need to work on that. 11-25-2022 upon evaluation today patient's wound still is continuing to progress it seems so close to complete resolution but is not quite done yet. Fortunately I do not see any signs of active infection locally or systemically which is great news. 12-02-2022 upon evaluation today patient appears to be doing well currently in regard to her wound. She has been tolerating the dressing changes although it still Bunney, Johnnisha Richmond ([id number]) [id number] Page 3 of 8 continues to get very dry. I do believe that we may want to use some Xeroform. I am going to clean this away and see if there is anything open but if it still open I think Xeroform is probably what I will switch her to. Electronic Signature(s) Signed: 12/02/2022 10:14:04 AM By: Allen Derry PA-C Entered By: Allen Derry on 12/02/2022 10:14:04 -------------------------------------------------------------------------------- Physical Exam Details Patient Name: Date of Service: [name], Kyiah Richmond. 12/02/2022 9:45 A M Medical Record Number: [id number] Patient Account Number: [id number] Date of Birth/Sex: Treating RN: 1946/02/17 (77 y.o. [name] Primary Care Provider: Kara Dies Other  Clinician: Referring Provider: Treating Provider/Extender: Claris Gower, Charanpreet Weeks in Treatment: 9 Constitutional Well-nourished and well-hydrated in no acute distress. Respiratory normal breathing without difficulty.  Psychiatric this patient is able to make decisions and demonstrates good insight into disease process. Alert and Oriented x 3. pleasant and cooperative. Notes Upon inspection patient's wound bed actually showed signs of good granulation epithelization at this point. Fortunately I do not see notes of active infection locally nor systemically at this time. Unfortunately there is still just a very tiny area that is still open once I removed all the callus and again this is not 100% close I still think it could cause her some issues. Electronic Signature(s) Signed: 12/02/2022 10:15:30 AM By: Allen Derry PA-C Entered By: Allen Derry on 12/02/2022 10:15:30 -------------------------------------------------------------------------------- Physician Orders Details Patient Name: Date of Service: [name], Chani Richmond. 12/02/2022 9:45 A M Medical Record Number: [id number] Patient Account Number: [id number] Date of Birth/Sex: Treating RN: 20-Dec-1945 (77 y.o. [name] Primary Care Provider: Kara Dies Other Clinician: Referring Provider: Treating Provider/Extender: Claris Gower, Charanpreet Weeks in Treatment: 9 Verbal / Phone Orders: No Diagnosis Coding ASHI, KRITZ ([id number]) [id number] Page 4 of 8 ICD-10 Coding Code Description E11.621 Type 2 diabetes mellitus with foot ulcer L97.512 Non-pressure chronic ulcer of other part of right foot with fat layer exposed I10 Essential (primary) hypertension I73.89 Other specified peripheral vascular diseases Z79.84 Long term (current) use of oral hypoglycemic drugs Follow-up Appointments Return Appointment in 1 week. Bathing/ Shower/ Hygiene May shower; gently cleanse  wound with antibacterial soap, rinse and pat dry prior to dressing wounds No tub bath. Anesthetic (Use 'Patient Medications' Section for Anesthetic Order Entry) Lidocaine applied to wound bed Off-Loading Other: - please wear tennis shoes at this time Wound Treatment Wound #1 - Foot Wound Laterality: Right, Medial Cleanser: Byram Ancillary Kit - 15 Day Supply (Generic) 1 x Per Day/30 Days Discharge Instructions: Use supplies as instructed; Kit contains: (15) Saline Bullets; (15) 3x3 Gauze; 15 pr Gloves Cleanser: Soap and Water 1 x Per Day/30 Days Discharge Instructions: Gently cleanse wound with antibacterial soap, rinse and pat dry prior to dressing wounds Prim Dressing: Xeroform-HBD 2x2 (in/in) 1 x Per Day/30 Days ary Discharge Instructions: double layer Apply Xeroform-HBD 2x2 (in/in) as directed Secondary Dressing: (BORDER) Zetuvit Plus SILICONE BORDER Dressing 4x4 (in/in) (Generic) 1 x Per Day/30 Days Discharge Instructions: Please do not put silicone bordered dressings under wraps. Use non-bordered dressing only. Electronic Signature(s) Signed: 12/02/2022 4:14:56 PM By: Angelina Pih Signed: 12/02/2022 5:13:07 PM By: Allen Derry PA-C Entered By: Angelina Pih on 12/02/2022 10:13:06 -------------------------------------------------------------------------------- Problem List Details Patient Name: Date of Service: [name], Briah Richmond. 12/02/2022 9:45 A M Medical Record Number: [id number] Patient Account Number: [id number] Date of Birth/Sex: Treating RN: [birth year] (77 y.o. [name] Primary Care Provider: Kara Dies Other Clinician: Referring Provider: Treating Provider/Extender: Claris Gower, Charanpreet Weeks in Treatment: 9 Active Problems ICD-10 Encounter Code Description Active Date MDM Diagnosis E11.621 Type 2 diabetes mellitus with foot ulcer 09/29/2022 No Yes Mancusi, Parminder Richmond ([id number]) [id number] Page 5 of 8 [phone]  Non-pressure chronic ulcer of other part of right foot with fat layer exposed 09/29/2022 No Yes I10 Essential (primary) hypertension 09/29/2022 No Yes I73.89 Other specified peripheral vascular diseases 09/29/2022 No Yes Z79.84 Long term (current) use of oral hypoglycemic drugs 09/29/2022 No Yes Inactive Problems Resolved Problems Electronic Signature(s) Signed: 12/02/2022 9:43:56 AM By: Allen Derry PA-C Entered By: Allen Derry on 12/02/2022 09:43:56 -------------------------------------------------------------------------------- Progress Note Details Patient Name: Date of Service: [name], Kenli Richmond. 12/02/2022 9:45 A M Medical Record Number: [id number] Patient Account Number: [id number] Date of  Birth/Sex: Treating RN: 09/01/1945 (77 y.o. [name] Primary Care Provider: Kara Dies Other Clinician: Referring Provider: Treating Provider/Extender: Claris Gower, Charanpreet Weeks in Treatment: 9 Subjective Chief Complaint Information obtained from Patient Right foot ulcer History of Present Illness (HPI) 09-29-2022 upon evaluation today patient appears to be doing poorly currently in regard to her wound on her right foot medially just at the base of her great toe but more plantar aspect. Unfortunately this has been open since around March 18 according to what the patient tells me although she tells me that she had "a bump there before started opening and draining". Fortunately there does not appear to be any signs of systemic infection though locally this does appear to be quite deep just the one concern I have about the potential for osteomyelitis this is something we can have to keep close eye on and fully evaluate. Fortunately there does not appear to be any signs of active infection locally or systemically which is great news. She does have a issue here with the ABIs she was noncompressible today so we were not able to determine an exact blood flow currently. With that  being said she does have seemingly good capillary refill and likely good arterial flow to be able to heal this. Patient does have a history of diabetes mellitus type 2 with a hemoglobin A1c stated to be 6.0 on September 11, 2022. She also has a history of hypertension, peripheral vascular disease, and she is on oral hypoglycemic agents at this point. 10-06-2022 upon evaluation today patient appears to be doing decently well in regard to her wound she is making some progress which is good news. Fortunately there does not appear to be any signs of active infection locally nor systemically which is also great news. No fevers, chills, nausea, vomiting, or diarrhea. 10-13-2022 upon evaluation today patient appears to be doing well currently with regard to her wound. This is actually showing signs of improvement little by little I do feel like we are getting smaller week by week. Fortunately I do not see any signs of active infection locally or systemically which is great news. No fevers, chills, nausea, vomiting, or diarrhea. 10-20-2022 upon evaluation today patient appears to be doing well currently in regard to her foot ulcer I am actually seeing signs of good improvement I am very pleased with what we are seeing here. Fortunately I do not see any evidence of worsening in general and I think that she is making good progress. 10-28-2022 upon evaluation today patient's wound actually showing signs of getting smaller were getting very close to closure though were not quite there yet. Fortunately I do not see any evidence of infection actively at this time which is great news. No fevers, chills, nausea, vomiting, or diarrhea. KELE, HRABOVSKY ([id number]) [id number] Page 6 of 8 11-04-2022 upon evaluation today patient appears to be doing excellent in regard to her wound her MRI was reviewed and it was negative for any signs of osteomyelitis which is excellent news. There was some reactive  changes but again with a wound present that has not out of the question and extraordinary. With that being said I do think that her wound is measuring much better she is very close to resolution though not completely done yet. 11-11-2022 upon evaluation today patient appears to be doing well currently in regard to her wound. In fact this is showing signs of improvement though she does have some callus I do not think this  is completely closed yet but we are getting closer. 11-18-2022 upon evaluation today patient appears to be doing well currently in regard to her wound. She has been tolerating the dressing changes without complication. Fortunately there does not appear to be any signs of active infection at this time she has a little bit of callus buildup and need to work on that. 11-25-2022 upon evaluation today patient's wound still is continuing to progress it seems so close to complete resolution but is not quite done yet. Fortunately I do not see any signs of active infection locally or systemically which is great news. 12-02-2022 upon evaluation today patient appears to be doing well currently in regard to her wound. She has been tolerating the dressing changes although it still continues to get very dry. I do believe that we may want to use some Xeroform. I am going to clean this away and see if there is anything open but if it still open I think Xeroform is probably what I will switch her to. Objective Constitutional Well-nourished and well-hydrated in no acute distress. Vitals Time Taken: 9:50 AM, Height: 63 in, Weight: 126 lbs, BMI: 22.3, Temperature: 97.9 F, Pulse: 90 bpm, Respiratory Rate: 18 breaths/min, Blood Pressure: 146/75 mmHg. Respiratory normal breathing without difficulty. Psychiatric this patient is able to make decisions and demonstrates good insight into disease process. Alert and Oriented x 3. pleasant and cooperative. General Notes: Upon inspection patient's wound bed  actually showed signs of good granulation epithelization at this point. Fortunately I do not see notes of active infection locally nor systemically at this time. Unfortunately there is still just a very tiny area that is still open once I removed all the callus and again this is not 100% close I still think it could cause her some issues. Integumentary (Hair, Skin) Wound #1 status is Open. Original cause of wound was Gradually Appeared. The date acquired was: 08/18/2022. The wound has been in treatment 9 weeks. The wound is located on the Right,Medial Foot. The wound measures 0.1cm length x 0.1cm width x 0.1cm depth; 0.008cm^2 area and 0.001cm^3 volume. There is no tunneling or undermining noted. There is a none present amount of drainage noted. There is no granulation within the wound bed. There is no necrotic tissue within the wound bed. Assessment Active Problems ICD-10 Type 2 diabetes mellitus with foot ulcer Non-pressure chronic ulcer of other part of right foot with fat layer exposed Essential (primary) hypertension Other specified peripheral vascular diseases Long term (current) use of oral hypoglycemic drugs Procedures Wound #1 Pre-procedure diagnosis of Wound #1 is a Diabetic Wound/Ulcer of the Lower Extremity located on the Right,Medial Foot .Severity of Tissue Pre Debridement is: Limited to breakdown of skin. There was a Selective/Open Wound Non-Viable Tissue Debridement with a total area of 0.01 sq cm performed by Allen Derry, PA-C. With the following instrument(s): Curette to remove Viable and Non-Viable tissue/material. Material removed includes Callus after achieving pain control using Lidocaine 4% Topical Solution. No specimens were taken. A time out was conducted at 10:06, prior to the start of the procedure. A Minimum amount of bleeding was controlled with Pressure. The procedure was tolerated well. Post Debridement Measurements: 0.1cm length x 0.1cm width x 0.1cm depth;  0.001cm^3 volume. Character of Wound/Ulcer Post Debridement is stable. Severity of Tissue Post Debridement is: Fat layer exposed. Post procedure Diagnosis Wound #1: Same as Pre-Procedure Plan BAYLEI, SPEAKES ([id number]) [id number] Page 7 of 8 Follow-up Appointments: Return Appointment in 1 week. Bathing/ Shower/ Hygiene:  May shower; gently cleanse wound with antibacterial soap, rinse and pat dry prior to dressing wounds No tub bath. Anesthetic (Use 'Patient Medications' Section for Anesthetic Order Entry): Lidocaine applied to wound bed Off-Loading: Other: - please wear tennis shoes at this time WOUND #1: - Foot Wound Laterality: Right, Medial Cleanser: Byram Ancillary Kit - 15 Day Supply (Generic) 1 x Per Day/30 Days Discharge Instructions: Use supplies as instructed; Kit contains: (15) Saline Bullets; (15) 3x3 Gauze; 15 pr Gloves Cleanser: Soap and Water 1 x Per Day/30 Days Discharge Instructions: Gently cleanse wound with antibacterial soap, rinse and pat dry prior to dressing wounds Prim Dressing: Xeroform-HBD 2x2 (in/in) 1 x Per Day/30 Days ary Discharge Instructions: double layer Apply Xeroform-HBD 2x2 (in/in) as directed Secondary Dressing: (BORDER) Zetuvit Plus SILICONE BORDER Dressing 4x4 (in/in) (Generic) 1 x Per Day/30 Days Discharge Instructions: Please do not put silicone bordered dressings under wraps. Use non-bordered dressing only. 1. I am good recommend currently that we have the patient continue to monitor for any signs of infection or worsening. Based on what I am seeing I do believe that we are making headway towards getting this closed I think that Xeroform gauze will probably be the way to go at this point. 2. I am good recommend as well that the patient should continue to change this on a regular basis I think daily may be the best way to go. 3. I would suggest as well that she should continue to offload is much as she can with the shoe  I think this is still going to be of utmost importance. We will see patient back for reevaluation in 1 week here in the clinic. If anything worsens or changes patient will contact our office for additional recommendations. Electronic Signature(s) Signed: 12/02/2022 10:16:27 AM By: Allen Derry PA-C Entered By: Allen Derry on 12/02/2022 10:16:27 -------------------------------------------------------------------------------- SuperBill Details Patient Name: Date of Service: [name], Azizi Richmond. 12/02/2022 Medical Record Number: [id number] Patient Account Number: [id number] Date of Birth/Sex: Treating RN: [birth year] (77 y.o. [name] Primary Care Provider: Kara Dies Other Clinician: Referring Provider: Treating Provider/Extender: Claris Gower, Charanpreet Weeks in Treatment: 9 Diagnosis Coding ICD-10 Codes Code Description E11.621 Type 2 diabetes mellitus with foot ulcer L97.512 Non-pressure chronic ulcer of other part of right foot with fat layer exposed I10 Essential (primary) hypertension I73.89 Other specified peripheral vascular diseases Z79.84 Long term (current) use of oral hypoglycemic drugs Facility Procedures : CPT4 Code: [id number] Description: [phone] - DEBRIDE WOUND 1ST 20 SQ CM OR < ICD-10 Diagnosis Description L97.512 Non-pressure chronic ulcer of other part of right foot with fat layer exposed Modifier: Quantity: 1 Physician Procedures TAYANA, BADOUR ([id number]): CPT4 Code Description [id number] - WC PHYS DEBR WO ANESTH 20 SQ CM ICD-10 Diagnosis Description L97.512 Non-pressure chronic ulcer of other part of right foot with fat la [id number] Page 8 of 8: Quantity Modifier 1 yer exposed Electronic Signature(s) Signed: 12/02/2022 10:16:39 AM By: Allen Derry PA-C Entered By: Allen Derry on 12/02/2022 10:16:39

## 2022-12-02 NOTE — Progress Notes (Addendum)
KINSLEY, LEARNED ([id number]) [id number] Page 1 of 8 Visit Report for 12/02/2022 Arrival Information Details Patient Name: Date of Service: [name], [name] 12/02/2022 9:45 A M Medical Record Number: [id number] Patient Account Number: [id number] Date of Birth/Sex: Treating RN: 09-02-1945 (77 y.o. [name] Primary Care Indy Prestwood: Kara Dies Other Clinician: Referring Michell Giuliano: Treating Armando Bukhari/Extender: Claris Gower, Charanpreet Weeks in Treatment: 9 Visit Information History Since Last Visit Added or deleted any medications: No Patient Arrived: Ambulatory Any new allergies or adverse reactions: No Arrival Time: 09:49 Had a fall or experienced change in No Accompanied By: self activities of daily living that may affect Transfer Assistance: None risk of falls: Patient Identification Verified: Yes Hospitalized since last visit: No Secondary Verification Process Completed: Yes Has Dressing in Place as Prescribed: Yes Patient Has Alerts: Yes Pain Present Now: No Patient Alerts: type 2 diabetic ABI 09/29/22 L/R Byron Electronic Signature(s) Signed: 12/02/2022 4:14:56 PM By: Angelina Pih Entered By: Angelina Pih on 12/02/2022 09:50:25 -------------------------------------------------------------------------------- Clinic Level of Care Assessment Details Patient Name: Date of Service: [name], [name] 12/02/2022 9:45 A M Medical Record Number: [id number] Patient Account Number: [id number] Date of Birth/Sex: Treating RN: May 28, 1946 (77 y.o. [name] Primary Care Ovida Delagarza: Kara Dies Other Clinician: Referring Pearlene Teat: Treating Grier Czerwinski/Extender: Claris Gower, Charanpreet Weeks in Treatment: 9 Clinic Level of Care Assessment Items TOOL 1 Quantity Score []  - 0 Use when EandM and Procedure is performed on INITIAL visit ASSESSMENTS - Nursing Assessment / Reassessment []  - 0 General Physical Exam (combine w/  comprehensive assessment (listed just below) when performed on new pt. evals) []  - 0 Comprehensive Assessment (HX, ROS, Risk Assessments, Wounds Hx, etc.) ASSESSMENTS - Wound and Skin Assessment / Reassessment []  - 0 Dermatologic / Skin Assessment (not related to wound area) ASSESSMENTS - Ostomy and/or Continence Assessment and Care Trumbull, Sayana L ([id number]) [id number] Page 2 of 8 []  - 0 Incontinence Assessment and Management []  - 0 Ostomy Care Assessment and Management (repouching, etc.) PROCESS - Coordination of Care []  - 0 Simple Patient / Family Education for ongoing care []  - 0 Complex (extensive) Patient / Family Education for ongoing care []  - 0 Staff obtains Chiropractor, Records, T Results / Process Orders est []  - 0 Staff telephones HHA, Nursing Homes / Clarify orders / etc []  - 0 Routine Transfer to another Facility (non-emergent condition) []  - 0 Routine Hospital Admission (non-emergent condition) []  - 0 New Admissions / Manufacturing engineer / Ordering NPWT Apligraf, etc. , []  - 0 Emergency Hospital Admission (emergent condition) PROCESS - Special Needs []  - 0 Pediatric / Minor Patient Management []  - 0 Isolation Patient Management []  - 0 Hearing / Language / Visual special needs []  - 0 Assessment of Community assistance (transportation, D/C planning, etc.) []  - 0 Additional assistance / Altered mentation []  - 0 Support Surface(s) Assessment (bed, cushion, seat, etc.) INTERVENTIONS - Miscellaneous []  - 0 External ear exam []  - 0 Patient Transfer (multiple staff / Nurse, adult / Similar devices) []  - 0 Simple Staple / Suture removal (25 or less) []  - 0 Complex Staple / Suture removal (26 or more) []  - 0 Hypo/Hyperglycemic Management (do not check if billed separately) []  - 0 Ankle / Brachial Index (ABI) - do not check if billed separately Has the patient been seen at the hospital within the last three years: Yes Total  Score: 0 Level Of Care: ____ Electronic Signature(s) Signed: 12/02/2022 4:14:56 PM By: Angelina Pih Entered By: Roger Shelter,  Caitlin on 12/02/2022 16:10:96 -------------------------------------------------------------------------------- Encounter Discharge Information Details Patient Name: Date of Service: [name], HARMANIE Richmond 12/02/2022 9:45 A M Medical Record Number: [id number] Patient Account Number: [id number] Date of Birth/Sex: Treating RN: 1945/10/23 (77 y.o. [name] Primary Care Chimere Klingensmith: Kara Dies Other Clinician: Referring Luiza Carranco: Treating [name]/Extender: Lionel December Weeks in Treatment: 9 Encounter Discharge Information Items Post Procedure Vitals Discharge Condition: Stable Temperature (F): 97.9 Hammac, Isabela L ([id number]) [id number] Page 3 of 8 Ambulatory Status: Ambulatory Pulse (bpm): 90 Discharge Destination: Home Respiratory Rate (breaths/min): 18 Transportation: Private Auto Blood Pressure (mmHg): 146/75 Accompanied By: self/ son in waiting room Schedule Follow-up Appointment: Yes Clinical Summary of Care: Electronic Signature(s) Signed: 12/02/2022 4:14:56 PM By: Angelina Pih Entered By: Angelina Pih on 12/02/2022 10:12:52 -------------------------------------------------------------------------------- Lower Extremity Assessment Details Patient Name: Date of Service: [name], [name] 12/02/2022 9:45 A M Medical Record Number: [id number] Patient Account Number: [id number] Date of Birth/Sex: Treating RN: 01-Dec-1945 (77 y.o. [name] Primary Care [name]: Kara Dies Other Clinician: Referring Dominque Marlin: Treating [name]/Extender: Claris Gower, Charanpreet Weeks in Treatment: 9 Edema Assessment Assessed: [Left: No] [Right: No] Edema: [Left: N] [Right: o] Vascular Assessment Pulses: Dorsalis Pedis Palpable: [Right:Yes] Electronic Signature(s) Signed: 12/02/2022  4:14:56 PM By: Angelina Pih Entered By: Angelina Pih on 12/02/2022 09:55:29 -------------------------------------------------------------------------------- Multi Wound Chart Details Patient Name: Date of Service: [name], [name] 12/02/2022 9:45 A M Medical Record Number: [id number] Patient Account Number: [id number] Date of Birth/Sex: Treating RN: 1946-04-30 (77 y.o. [name] Primary Care Zeeva Courser: Kara Dies Other Clinician: Referring Ajai Harville: Treating Rishit Burkhalter/Extender: Claris Gower, Charanpreet Weeks in Treatment: 9 Vital Signs Vanalstine, Quamesha L ([id number]) [id number] Page 4 of 8 Height(in): 63 Pulse(bpm): 90 Weight(lbs): 126 Blood Pressure(mmHg): 146/75 Body Mass Index(BMI): 22.3 Temperature(F): 97.9 Respiratory Rate(breaths/min): 18 [1:Photos:] [N/A:N/A] Right, Medial Foot N/A N/A Wound Location: Gradually Appeared N/A N/A Wounding Event: Diabetic Wound/Ulcer of the Lower N/A N/A Primary Etiology: Extremity Cataracts, Anemia, Hypertension, N/A N/A Comorbid History: Type II Diabetes, Rheumatoid Arthritis 08/18/2022 N/A N/A Date Acquired: 9 N/A N/A Weeks of Treatment: Open N/A N/A Wound Status: No N/A N/A Wound Recurrence: 0.1x0.1x0.1 N/A N/A Measurements L x W x D (cm) 0.008 N/A N/A A (cm) : rea 0.001 N/A N/A Volume (cm) : 98.90% N/A N/A % Reduction in A rea: 99.50% N/A N/A % Reduction in Volume: Grade 2 N/A N/A Classification: None Present N/A N/A Exudate A mount: None Present (0%) N/A N/A Granulation A mount: None Present (0%) N/A N/A Necrotic A mount: Fat Layer (Subcutaneous Tissue): No N/A N/A Exposed Structures: None N/A N/A Epithelialization: Treatment Notes Electronic Signature(s) Signed: 12/02/2022 4:14:56 PM By: Angelina Pih Entered By: Angelina Pih on 12/02/2022 10:06:15 -------------------------------------------------------------------------------- Multi-Disciplinary Care  Plan Details Patient Name: Date of Service: [name], [name] 12/02/2022 9:45 A M Medical Record Number: [id number] Patient Account Number: [id number] Date of Birth/Sex: Treating RN: 1945/11/20 (77 y.o. [name] Primary Care Carolynne Schuchard: Kara Dies Other Clinician: Referring Shanira Tine: Treating Tomorrow Dehaas/Extender: Claris Gower, Charanpreet Weeks in Treatment: 9 Active Inactive Wound/Skin Impairment Nursing Diagnoses: Impaired tissue integrity Petrosky, Kechia L ([id number]) [id number] Page 5 of 8 Knowledge deficit related to ulceration/compromised skin integrity Goals: Ulcer/skin breakdown will have a volume reduction of 30% by week 4 Date Initiated: 09/29/2022 Date Inactivated: 11/04/2022 Target Resolution Date: 10/27/2022 Goal Status: Met Ulcer/skin breakdown will have a volume reduction of 50% by week 8 Date Initiated: 09/29/2022 Date Inactivated: 11/25/2022 Target Resolution Date: 11/24/2022 Goal  Status: Met Ulcer/skin breakdown will have a volume reduction of 80% by week 12 Date Initiated: 09/29/2022 Target Resolution Date: 12/22/2022 Goal Status: Active Ulcer/skin breakdown will heal within 14 weeks Date Initiated: 09/29/2022 Target Resolution Date: 01/05/2023 Goal Status: Active Interventions: Assess patient/caregiver ability to obtain necessary supplies Assess patient/caregiver ability to perform ulcer/skin care regimen upon admission and as needed Assess ulceration(s) every visit Provide education on ulcer and skin care Treatment Activities: Skin care regimen initiated : 09/29/2022 Notes: Electronic Signature(s) Signed: 12/02/2022 4:14:56 PM By: Angelina Pih Entered By: Angelina Pih on 12/02/2022 10:08:38 -------------------------------------------------------------------------------- Pain Assessment Details Patient Name: Date of Service: [name], [name] 12/02/2022 9:45 A M Medical Record Number: [id number] Patient Account  Number: [id number] Date of Birth/Sex: Treating RN: Nov 25, 1945 (77 y.o. [name] Primary Care Zell Hylton: Kara Dies Other Clinician: Referring Jameyah Fennewald: Treating Fritzie Prioleau/Extender: Claris Gower, Charanpreet Weeks in Treatment: 9 Active Problems Location of Pain Severity and Description of Pain Patient Has Paino No Site Locations Rate the pain. Current Pain Level: 0 Gulla, Kenndra L ([id number]) [id number] Page 6 of 8 Pain Management and Medication Current Pain Management: Electronic Signature(s) Signed: 12/02/2022 4:14:56 PM By: Angelina Pih Entered By: Angelina Pih on 12/02/2022 09:52:56 -------------------------------------------------------------------------------- Patient/Caregiver Education Details Patient Name: Date of Service: [name], [name] 7/2/2024andnbsp9:45 A M Medical Record Number: [id number] Patient Account Number: [id number] Date of Birth/Gender: Treating RN: [birth year] (77 y.o. [name] Primary Care Physician: Kara Dies Other Clinician: Referring Physician: Treating Physician/Extender: Lionel December Weeks in Treatment: 9 Education Assessment Education Provided To: Patient Education Topics Provided Wound Debridement: Handouts: Wound Debridement Methods: Explain/Verbal Responses: State content correctly Wound/Skin Impairment: Handouts: Caring for Your Ulcer Methods: Explain/Verbal Responses: State content correctly Electronic Signature(s) Signed: 12/02/2022 4:14:56 PM By: Angelina Pih Entered By: Angelina Pih on 12/02/2022 10:08:51 -------------------------------------------------------------------------------- Wound Assessment Details Patient Name: Date of Service: [name], [name] 12/02/2022 9:45 A M Medical Record Number: [id number] Patient Account Number: [id number] Date of Birth/Sex: Treating RN: 1946/01/10 (77 y.o. [name] Primary Care  Ysidra Sopher: Kara Dies Other Clinician: YOHANA, TASKER ([id number]) [id number] Page 7 of 8 Referring Parul Porcelli: Treating Dorella Laster/Extender: Claris Gower, Charanpreet Weeks in Treatment: 9 Wound Status Wound Number: 1 Primary Diabetic Wound/Ulcer of the Lower Extremity Etiology: Wound Location: Right, Medial Foot Wound Status: Open Wounding Event: Gradually Appeared Comorbid Cataracts, Anemia, Hypertension, Type II Diabetes, Date Acquired: 08/18/2022 History: Rheumatoid Arthritis Weeks Of Treatment: 9 Clustered Wound: No Photos Wound Measurements Length: (cm) 0.1 Width: (cm) 0.1 Depth: (cm) 0.1 Area: (cm) 0.008 Volume: (cm) 0.001 % Reduction in Area: 98.9% % Reduction in Volume: 99.5% Epithelialization: None Tunneling: No Undermining: No Wound Description Classification: Grade 2 Exudate Amount: None Present Foul Odor After Cleansing: No Slough/Fibrino Yes Wound Bed Granulation Amount: None Present (0%) Exposed Structure Necrotic Amount: None Present (0%) Fat Layer (Subcutaneous Tissue) Exposed: No Treatment Notes Wound #1 (Foot) Wound Laterality: Right, Medial Cleanser [employer]ram Ancillary Kit - 15 Day Supply Discharge Instruction: Use supplies as instructed; Kit contains: (15) Saline Bullets; (15) 3x3 Gauze; 15 pr Gloves Soap and Water Discharge Instruction: Gently cleanse wound with antibacterial soap, rinse and pat dry prior to dressing wounds Peri-Wound Care Topical Primary Dressing Xeroform-HBD 2x2 (in/in) Discharge Instruction: double layer Apply Xeroform-HBD 2x2 (in/in) as directed Secondary Dressing (BORDER) Zetuvit Plus SILICONE BORDER Dressing 4x4 (in/in) Discharge Instruction: Please do not put silicone bordered dressings under wraps. Use non-bordered dressing only. Secured With Compression Wrap Compression Stockings Facilities manager)  Signed: 12/02/2022 4:14:56 PM By: Angelina Pih Entered By: Angelina Pih on 12/02/2022 09:55:12 Kathreen Devoid ([id number]) [id number] Page 8 of 8 -------------------------------------------------------------------------------- Vitals Details Patient Name: Date of Service: [name], [name]. 12/02/2022 9:45 A M Medical Record Number: [id number] Patient Account Number: [id number] Date of Birth/Sex: Treating RN: 05-11-1946 (77 y.o. [name] Primary Care Ayerim Berquist: Kara Dies Other Clinician: Referring Lizandra Zakrzewski: Treating Emmarose Klinke/Extender: Claris Gower, Charanpreet Weeks in Treatment: 9 Vital Signs Time Taken: 09:50 Temperature (F): 97.9 Height (in): 63 Pulse (bpm): 90 Weight (lbs): 126 Respiratory Rate (breaths/min): 18 Body Mass Index (BMI): 22.3 Blood Pressure (mmHg): 146/75 Reference Range: 80 - 120 mg / dl Electronic Signature(s) Signed: 12/02/2022 4:14:56 PM By: Angelina Pih Entered By: Angelina Pih on 12/02/2022 09:51:48

## 2022-12-09 ENCOUNTER — Encounter: Payer: Medicare PPO | Admitting: Physician Assistant

## 2022-12-09 DIAGNOSIS — Z7984 Long term (current) use of oral hypoglycemic drugs: Secondary | ICD-10-CM | POA: Diagnosis not present

## 2022-12-09 DIAGNOSIS — L97512 Non-pressure chronic ulcer of other part of right foot with fat layer exposed: Secondary | ICD-10-CM | POA: Diagnosis not present

## 2022-12-09 DIAGNOSIS — M069 Rheumatoid arthritis, unspecified: Secondary | ICD-10-CM | POA: Diagnosis not present

## 2022-12-09 DIAGNOSIS — I10 Essential (primary) hypertension: Secondary | ICD-10-CM | POA: Diagnosis not present

## 2022-12-09 DIAGNOSIS — E1151 Type 2 diabetes mellitus with diabetic peripheral angiopathy without gangrene: Secondary | ICD-10-CM | POA: Diagnosis not present

## 2022-12-09 DIAGNOSIS — E11621 Type 2 diabetes mellitus with foot ulcer: Secondary | ICD-10-CM | POA: Diagnosis not present

## 2022-12-09 NOTE — Progress Notes (Signed)
Hannah, Richmond ([id number]) [id number] Page 1 of 8 Visit Report for 12/09/2022 Chief Complaint Document Details Patient Name: Date of Service: [name], [name] 12/09/2022 9:45 A M Medical Record Number: [id number] Patient Account Number: [id number] Date of Birth/Sex: Treating RN: 1945-10-10 (77 y.o. [name] Primary Care Provider: Kara Dies Other Clinician: Referring Provider: Treating Provider/Extender: Claris Gower, Charanpreet Weeks in Treatment: 10 Information Obtained from: Patient Chief Complaint Right foot ulcer Electronic Signature(Richmond) Signed: 12/09/2022 10:31:14 AM By: Allen Derry PA-C Entered By: Allen Derry on 12/09/2022 10:31:14 -------------------------------------------------------------------------------- Debridement Details Patient Name: Date of Service: [name], [name]. 12/09/2022 9:45 A M Medical Record Number: [id number] Patient Account Number: [id number] Date of Birth/Sex: Treating RN: [birth year] (77 y.o. [name] Primary Care Provider: Kara Dies Other Clinician: Referring Provider: Treating Provider/Extender: Claris Gower, Charanpreet Weeks in Treatment: 10 Debridement Performed for Assessment: Wound #2 Right T Fifth oe Performed By: Physician Allen Derry, PA-C Debridement Type: Debridement Severity of Tissue Pre Debridement: Fat layer exposed Level of Consciousness (Pre-procedure): Awake and Alert Pre-procedure Verification/Time Out Yes - 10:35 Taken: Percent of Wound Bed Debrided: 100% T Area Debrided (cm): otal 0.07 Tissue and other material debrided: Viable, Non-Viable, Callus, Slough, Subcutaneous, Slough Level: Skin/Subcutaneous Tissue Debridement Description: Excisional Instrument: Curette Bleeding: Moderate Hemostasis Achieved: Pressure Response to Treatment: Procedure was tolerated [name] Level of Consciousness (Post- Awake and Alert procedure): [name]  ([id number]) [phone].pdf Page 2 of 8 Post Debridement Measurements of Total Wound Length: (cm) 0.3 Width: (cm) 0.3 Depth: (cm) 0.1 Volume: (cm) 0.007 Character of Wound/Ulcer Post Debridement: Stable Severity of Tissue Post Debridement: Fat layer exposed Post Procedure Diagnosis Same as Pre-procedure Electronic Signature(Richmond) Signed: 12/09/2022 5:13:20 PM By: Angelina Pih Signed: 12/09/2022 7:04:22 PM By: Allen Derry PA-C Entered By: Angelina Pih on 12/09/2022 10:35:59 -------------------------------------------------------------------------------- HPI Details Patient Name: Date of Service: [name], [name]. 12/09/2022 9:45 A M Medical Record Number: [id number] Patient Account Number: [id number] Date of Birth/Sex: Treating RN: 10-06-1945 (77 y.o. [name] Primary Care Provider: Kara Dies Other Clinician: Referring Provider: Treating Provider/Extender: Claris Gower, Charanpreet Weeks in Treatment: 10 History of Present Illness HPI Description: 09-29-2022 upon evaluation today patient appears to be doing poorly currently in regard to her wound on her right foot medially just at the base of her great toe but more plantar aspect. Unfortunately this has been open since around March 18 according to what the patient tells me although she tells me that she had "a bump there before started opening and draining". Fortunately there does not appear to be any signs of systemic infection though locally this does appear to be quite deep just the one concern I have about the potential for osteomyelitis this is something we can have to keep close eye on and fully evaluate. Fortunately there does not appear to be any signs of active infection locally or systemically which is great news. She does have a issue here with the ABIs she was noncompressible today so we were not able to determine an exact blood flow currently. With that being said she does have  seemingly good capillary refill and likely good arterial flow to be able to heal this. Patient does have a history of diabetes mellitus type 2 with a hemoglobin A1c stated to be 6.0 on September 11, 2022. She also has a history of hypertension, peripheral vascular disease, and she is on oral hypoglycemic agents at this point. 10-06-2022 upon evaluation today patient appears  to be doing decently [name] in regard to her wound she is making some progress which is good news. Fortunately there does not appear to be any signs of active infection locally nor systemically which is also great news. No fevers, chills, nausea, vomiting, or diarrhea. 10-13-2022 upon evaluation today patient appears to be doing [name] currently with regard to her wound. This is actually showing signs of improvement little by little I do feel like we are getting smaller week by week. Fortunately I do not see any signs of active infection locally or systemically which is great news. No fevers, chills, nausea, vomiting, or diarrhea. 10-20-2022 upon evaluation today patient appears to be doing [name] currently in regard to her foot ulcer I am actually seeing signs of good improvement I am very pleased with what we are seeing here. Fortunately I do not see any evidence of worsening in general and I think that she is making good progress. 10-28-2022 upon evaluation today patient'Richmond wound actually showing signs of getting smaller were getting very close to closure though were not quite there yet. Fortunately I do not see any evidence of infection actively at this time which is great news. No fevers, chills, nausea, vomiting, or diarrhea. 11-04-2022 upon evaluation today patient appears to be doing excellent in regard to her wound her MRI was reviewed and it was negative for any signs of osteomyelitis which is excellent news. There was some reactive changes but again with a wound present that has not out of the question and extraordinary. With that being  said I do think that her wound is measuring much better she is very close to resolution though not completely done yet. 11-11-2022 upon evaluation today patient appears to be doing [name] currently in regard to her wound. In fact this is showing signs of improvement though she does have some callus I do not think this is completely closed yet but we are getting closer. 11-18-2022 upon evaluation today patient appears to be doing [name] currently in regard to her wound. She has been tolerating the dressing changes without complication. Fortunately there does not appear to be any signs of active infection at this time she has a little bit of callus buildup and need to work on that. 11-25-2022 upon evaluation today patient'Richmond wound still is continuing to progress it seems so close to complete resolution but is not quite done yet. Fortunately I do not see any signs of active infection locally or systemically which is great news. 12-02-2022 upon evaluation today patient appears to be doing [name] currently in regard to her wound. She has been tolerating the dressing changes although it still continues to get very dry. I do believe that we may want to use some Xeroform. I am going to clean this away and see if there is anything open but if it still Tindel, Maragret Richmond ([id number]) [id number].pdf Page 3 of 8 open I think Xeroform is probably what I will switch her to. 12-09-2022 upon evaluation patient'Richmond wound actually appears to be doing better in regard to the foot. She has been tolerating the dressing changes and in general I think that we are on the right track towards closure in fact today I am hopeful that this wound that we have been treating may be closed. Electronic Signature(Richmond) Signed: 12/09/2022 6:43:10 PM By: Allen Derry PA-C Entered By: Allen Derry on 12/09/2022 18:43:10 -------------------------------------------------------------------------------- Physical Exam Details Patient Name:  Date of Service: [name], [name]. 12/09/2022 9:45 A M Medical Record  Number: [id number] Patient Account Number: [id number] Date of Birth/Sex: Treating RN: [birth year] (76 y.o. [name] Primary Care Provider: Kara Dies Other Clinician: Referring Provider: Treating Provider/Extender: Claris Gower, Charanpreet Weeks in Treatment: 10 Constitutional [name]-nourished and [name]-hydrated in no acute distress. Respiratory normal breathing without difficulty. Psychiatric this patient is able to make decisions and demonstrates good insight into disease process. Alert and Oriented x 3. pleasant and cooperative. Notes Upon inspection patient'Richmond wound on the foot that we have been managing actually did appear to be close when I remove some of the callus this is great news I do not see anything obviously open at this point. However on the fifth toe she actually had a wound which was looking to be more callus but as the callus popped off there was a small opening underneath just a small slit but nonetheless something that we need to make sure we get completely closed before discharging her. Electronic Signature(Richmond) Signed: 12/09/2022 6:43:42 PM By: Allen Derry PA-C Entered By: Allen Derry on 12/09/2022 18:43:41 -------------------------------------------------------------------------------- Physician Orders Details Patient Name: Date of Service: [name], [name]. 12/09/2022 9:45 A M Medical Record Number: [id number] Patient Account Number: [id number] Date of Birth/Sex: Treating RN: [birth year] (77 y.o. [name] Primary Care Provider: Kara Dies Other Clinician: Referring Provider: Treating Provider/Extender: Lionel December Weeks in Treatment: 10 Verbal / Phone Orders: No ESTEFFANY, NEKOLA ([id number]) [id number] Page 4 of 8 Diagnosis Coding ICD-10 Coding Code Description E11.621 Type 2 diabetes mellitus with foot  ulcer L97.512 Non-pressure chronic ulcer of other part of right foot with fat layer exposed I10 Essential (primary) hypertension I73.89 Other specified peripheral vascular diseases Z79.84 Long term (current) use of oral hypoglycemic drugs Follow-up Appointments Return Appointment in 1 week. Bathing/ Shower/ Hygiene May shower; gently cleanse wound with antibacterial soap, rinse and pat dry prior to dressing wounds No tub bath. Anesthetic (Use 'Patient Medications' Section for Anesthetic Order Entry) Lidocaine applied to wound bed Off-Loading Other: - recommended trying to find a shoe that is wider for right foot to decrease any rubbing Wound Treatment Wound #1 - Foot Wound Laterality: Right, Medial Cleanser: Byram Ancillary Kit - 15 Day Supply (Generic) 1 x Per Day/30 Days Discharge Instructions: Use supplies as instructed; Kit contains: (15) Saline Bullets; (15) 3x3 Gauze; 15 pr Gloves Cleanser: Soap and Water 1 x Per Day/30 Days Discharge Instructions: Gently cleanse wound with antibacterial soap, rinse and pat dry prior to dressing wounds Prim Dressing: Xeroform-HBD 2x2 (in/in) 1 x Per Day/30 Days ary Discharge Instructions: double layer Apply Xeroform-HBD 2x2 (in/in) as directed Secondary Dressing: (BORDER) Zetuvit Plus SILICONE BORDER Dressing 4x4 (in/in) (Generic) 1 x Per Day/30 Days Discharge Instructions: Please do not put silicone bordered dressings under wraps. Use non-bordered dressing only. Wound #2 - T Fifth oe Wound Laterality: Right Cleanser: Byram Ancillary Kit - 15 Day Supply (Generic) 1 x Per Day/30 Days Discharge Instructions: Use supplies as instructed; Kit contains: (15) Saline Bullets; (15) 3x3 Gauze; 15 pr Gloves Cleanser: Soap and Water 1 x Per Day/30 Days Discharge Instructions: Gently cleanse wound with antibacterial soap, rinse and pat dry prior to dressing wounds Prim Dressing: Xeroform-HBD 2x2 (in/in) 1 x Per Day/30 Days ary Discharge Instructions: double  layer Apply Xeroform-HBD 2x2 (in/in) as directed Secondary Dressing: Coverlet Latex-Free Fabric Adhesive Dressings 1 x Per Day/30 Days Discharge Instructions: Knuckle Electronic Signature(Richmond) Signed: 12/09/2022 5:13:20 PM By: Angelina Pih Signed: 12/09/2022 7:04:22 PM By: Allen Derry PA-C Entered By: Angelina Pih on  12/09/2022 11:30:12 -------------------------------------------------------------------------------- Problem List Details Patient Name: Date of Service: [name], [name] 12/09/2022 9:45 A M Medical Record Number: [id number] Patient Account Number: [id number] MISCHELE, HAIRGROVE ([id number]) [id number] Page 5 of 8 Date of Birth/Sex: Treating RN: 01/13/1946 (77 y.o. [name] Primary Care Provider: Other Clinician: Kara Dies Referring Provider: Treating Provider/Extender: Claris Gower, Charanpreet Weeks in Treatment: 10 Active Problems ICD-10 Encounter Code Description Active Date MDM Diagnosis E11.621 Type 2 diabetes mellitus with foot ulcer 09/29/2022 No Yes L97.512 Non-pressure chronic ulcer of other part of right foot with fat layer exposed 09/29/2022 No Yes I10 Essential (primary) hypertension 09/29/2022 No Yes I73.89 Other specified peripheral vascular diseases 09/29/2022 No Yes Z79.84 Long term (current) use of oral hypoglycemic drugs 09/29/2022 No Yes Inactive Problems Resolved Problems Electronic Signature(Richmond) Signed: 12/09/2022 10:31:10 AM By: Allen Derry PA-C Entered By: Allen Derry on 12/09/2022 10:31:09 -------------------------------------------------------------------------------- Progress Note Details Patient Name: Date of Service: [name], [name]. 12/09/2022 9:45 A M Medical Record Number: [id number] Patient Account Number: [id number] Date of Birth/Sex: Treating RN: 04-25-1946 (77 y.o. [name] Primary Care Provider: Kara Dies Other Clinician: Referring Provider: Treating  Provider/Extender: Claris Gower, Charanpreet Weeks in Treatment: 10 Subjective Chief Complaint Information obtained from Patient Right foot ulcer History of Present Illness (HPI) 09-29-2022 upon evaluation today patient appears to be doing poorly currently in regard to her wound on her right foot medially just at the base of her great toe but more plantar aspect. Unfortunately this has been open since around March 18 according to what the patient tells me although she tells me that she had "a bump there before started opening and draining". Fortunately there does not appear to be any signs of systemic infection though locally this does appear to be quite deep just the one concern I have about the potential for osteomyelitis this is something we can have to keep close eye on and fully evaluate. Fortunately there does not appear to be any signs of active infection locally or systemically which is great news. She does have a issue here with the ABIs she was noncompressible today so we were not able to determine an exact blood flow currently. With that being said she does have seemingly good capillary Matheny, Laconda Richmond ([id number]) [phone].pdf Page 6 of 8 refill and likely good arterial flow to be able to heal this. Patient does have a history of diabetes mellitus type 2 with a hemoglobin A1c stated to be 6.0 on September 11, 2022. She also has a history of hypertension, peripheral vascular disease, and she is on oral hypoglycemic agents at this point. 10-06-2022 upon evaluation today patient appears to be doing decently [name] in regard to her wound she is making some progress which is good news. Fortunately there does not appear to be any signs of active infection locally nor systemically which is also great news. No fevers, chills, nausea, vomiting, or diarrhea. 10-13-2022 upon evaluation today patient appears to be doing [name] currently with regard to her wound. This is  actually showing signs of improvement little by little I do feel like we are getting smaller week by week. Fortunately I do not see any signs of active infection locally or systemically which is great news. No fevers, chills, nausea, vomiting, or diarrhea. 10-20-2022 upon evaluation today patient appears to be doing [name] currently in regard to her foot ulcer I am actually seeing signs of good improvement I am very pleased with what we are  seeing here. Fortunately I do not see any evidence of worsening in general and I think that she is making good progress. 10-28-2022 upon evaluation today patient'Richmond wound actually showing signs of getting smaller were getting very close to closure though were not quite there yet. Fortunately I do not see any evidence of infection actively at this time which is great news. No fevers, chills, nausea, vomiting, or diarrhea. 11-04-2022 upon evaluation today patient appears to be doing excellent in regard to her wound her MRI was reviewed and it was negative for any signs of osteomyelitis which is excellent news. There was some reactive changes but again with a wound present that has not out of the question and extraordinary. With that being said I do think that her wound is measuring much better she is very close to resolution though not completely done yet. 11-11-2022 upon evaluation today patient appears to be doing [name] currently in regard to her wound. In fact this is showing signs of improvement though she does have some callus I do not think this is completely closed yet but we are getting closer. 11-18-2022 upon evaluation today patient appears to be doing [name] currently in regard to her wound. She has been tolerating the dressing changes without complication. Fortunately there does not appear to be any signs of active infection at this time she has a little bit of callus buildup and need to work on that. 11-25-2022 upon evaluation today patient'Richmond wound still is continuing  to progress it seems so close to complete resolution but is not quite done yet. Fortunately I do not see any signs of active infection locally or systemically which is great news. 12-02-2022 upon evaluation today patient appears to be doing [name] currently in regard to her wound. She has been tolerating the dressing changes although it still continues to get very dry. I do believe that we may want to use some Xeroform. I am going to clean this away and see if there is anything open but if it still open I think Xeroform is probably what I will switch her to. 12-09-2022 upon evaluation patient'Richmond wound actually appears to be doing better in regard to the foot. She has been tolerating the dressing changes and in general I think that we are on the right track towards closure in fact today I am hopeful that this wound that we have been treating may be closed. Objective Constitutional [name]-nourished and [name]-hydrated in no acute distress. Vitals Time Taken: 10:01 AM, Height: 63 in, Weight: 126 lbs, BMI: 22.3, Temperature: 97.6 F, Pulse: 84 bpm, Respiratory Rate: 18 breaths/min, Blood Pressure: 147/80 mmHg. Respiratory normal breathing without difficulty. Psychiatric this patient is able to make decisions and demonstrates good insight into disease process. Alert and Oriented x 3. pleasant and cooperative. General Notes: Upon inspection patient'Richmond wound on the foot that we have been managing actually did appear to be close when I remove some of the callus this is great news I do not see anything obviously open at this point. However on the fifth toe she actually had a wound which was looking to be more callus but as the callus popped off there was a small opening underneath just a small slit but nonetheless something that we need to make sure we get completely closed before discharging her. Integumentary (Hair, Skin) Wound #1 status is Open. Original cause of wound was Gradually Appeared. The date acquired  was: 08/18/2022. The wound has been in treatment 10 weeks. The wound is located  on the Right,Medial Foot. The wound measures 0.1cm length x 0.1cm width x 0.1cm depth; 0.008cm^2 area and 0.001cm^3 volume. There is no tunneling or undermining noted. There is a none present amount of drainage noted. There is no granulation within the wound bed. There is no necrotic tissue within the wound bed. Wound #2 status is Open. Original cause of wound was Gradually Appeared. The date acquired was: 12/09/2022. The wound is located on the Right T Fifth. The oe wound measures 0.3cm length x 0.3cm width x 0.1cm depth; 0.071cm^2 area and 0.007cm^3 volume. There is Fat Layer (Subcutaneous Tissue) exposed. There is no tunneling or undermining noted. There is a medium amount of serosanguineous drainage noted. There is large (67-100%) red granulation within the wound bed. There is a small (1-33%) amount of necrotic tissue within the wound bed including Adherent Slough. Assessment Active Problems ICD-10 Type 2 diabetes mellitus with foot ulcer Non-pressure chronic ulcer of other part of right foot with fat layer exposed Essential (primary) hypertension Other specified peripheral vascular diseases [name], [name] ([id number]) [id number] Page 7 of 8 Long term (current) use of oral hypoglycemic drugs Procedures Wound #2 Pre-procedure diagnosis of Wound #2 is a Diabetic Wound/Ulcer of the Lower Extremity located on the Right T Fifth .Severity of Tissue Pre Debridement is: oe Fat layer exposed. There was a Excisional Skin/Subcutaneous Tissue Debridement with a total area of 0.07 sq cm performed by Allen Derry, PA-C. With the following instrument(Richmond): Curette to remove Viable and Non-Viable tissue/material. Material removed includes Callus, Subcutaneous Tissue, and Slough. No specimens were taken. A time out was conducted at 10:35, prior to the start of the procedure. A Moderate amount of  bleeding was controlled with Pressure. The procedure was tolerated [name]. Post Debridement Measurements: 0.3cm length x 0.3cm width x 0.1cm depth; 0.007cm^3 volume. Character of Wound/Ulcer Post Debridement is stable. Severity of Tissue Post Debridement is: Fat layer exposed. Post procedure Diagnosis Wound #2: Same as Pre-Procedure Plan Follow-up Appointments: Return Appointment in 1 week. Bathing/ Shower/ Hygiene: May shower; gently cleanse wound with antibacterial soap, rinse and pat dry prior to dressing wounds No tub bath. Anesthetic (Use 'Patient Medications' Section for Anesthetic Order Entry): Lidocaine applied to wound bed Off-Loading: Other: - recommended trying to find a shoe that is wider for right foot to decrease any rubbing WOUND #1: - Foot Wound Laterality: Right, Medial Cleanser: Byram Ancillary Kit - 15 Day Supply (Generic) 1 x Per Day/30 Days Discharge Instructions: Use supplies as instructed; Kit contains: (15) Saline Bullets; (15) 3x3 Gauze; 15 pr Gloves Cleanser: Soap and Water 1 x Per Day/30 Days Discharge Instructions: Gently cleanse wound with antibacterial soap, rinse and pat dry prior to dressing wounds Prim Dressing: Xeroform-HBD 2x2 (in/in) 1 x Per Day/30 Days ary Discharge Instructions: double layer Apply Xeroform-HBD 2x2 (in/in) as directed Secondary Dressing: (BORDER) Zetuvit Plus SILICONE BORDER Dressing 4x4 (in/in) (Generic) 1 x Per Day/30 Days Discharge Instructions: Please do not put silicone bordered dressings under wraps. Use non-bordered dressing only. WOUND #2: - T Fifth Wound Laterality: Right oe Cleanser: Byram Ancillary Kit - 15 Day Supply (Generic) 1 x Per Day/30 Days Discharge Instructions: Use supplies as instructed; Kit contains: (15) Saline Bullets; (15) 3x3 Gauze; 15 pr Gloves Cleanser: Soap and Water 1 x Per Day/30 Days Discharge Instructions: Gently cleanse wound with antibacterial soap, rinse and pat dry prior to dressing wounds Prim  Dressing: Xeroform-HBD 2x2 (in/in) 1 x Per Day/30 Days ary Discharge Instructions: double layer Apply Xeroform-HBD 2x2 (in/in) as  directed Secondary Dressing: Coverlet Latex-Free Fabric Adhesive Dressings 1 x Per Day/30 Days Discharge Instructions: Knuckle 1. I would recommend that the patient should continue to monitor for any evidence of infection or worsening. Based on what I am seeing I do believe that we are making good headway towards closure I would recommend Xeroform be used at both locations. I am hopeful that the original wound is closed at this point and will remain so and in regard to the new area on the fifth toe and hopeful that will heal very quickly. 2. I would recommend as [name] the patient should continue to monitor for any signs of infection or worsening. Obviously if anything changes she knows contact the office let me know. We will see patient back for reevaluation in 1 week here in the clinic. If anything worsens or changes patient will contact our office for additional recommendations. Electronic Signature([place]) Signed: 12/09/2022 6:44:17 PM By: Allen Derry PA-C Entered By: Allen Derry on 12/09/2022 18:44:17 [name], [name] ([id number]) [id number] Page 8 of 8 -------------------------------------------------------------------------------- SuperBill Details Patient Name: Date of Service: [name], Hannah SOWDER 12/09/2022 Medical Record Number: [id number] Patient Account Number: [id number] Date of Birth/Sex: Treating RN: September 19, 1945 (77 y.o. [name] Primary Care Provider: Kara Dies Other Clinician: Referring Provider: Treating Provider/Extender: Claris Gower, Charanpreet Weeks in Treatment: 10 Diagnosis Coding ICD-10 Codes Code Description E11.621 Type 2 diabetes mellitus with foot ulcer L97.512 Non-pressure chronic ulcer of other part of right foot with fat layer exposed I10 Essential (primary) hypertension I73.89 Other  specified peripheral vascular diseases Z79.84 Long term (current) use of oral hypoglycemic drugs Facility Procedures : CPT4 Code: [id number] Description: 11042 - DEB SUBQ TISSUE 20 SQ CM/< ICD-10 Diagnosis Description L97.512 Non-pressure chronic ulcer of other part of right foot with fat layer exposed Modifier: Quantity: 1 Physician Procedures : CPT4 Code Description Modifier [id number] 11042 - WC PHYS SUBQ TISS 20 SQ CM ICD-10 Diagnosis Description L97.512 Non-pressure chronic ulcer of other part of right foot with fat layer exposed Quantity: 1 Electronic Signature([place]) Signed: 12/09/2022 6:44:32 PM By: Allen Derry PA-C Entered By: Allen Derry on 12/09/2022 18:44:31

## 2022-12-09 NOTE — Progress Notes (Signed)
Richmond, Hannah ([id number]) [id number] Page 1 of 9 Visit Report for 12/09/2022 Arrival Information Details Patient Name: Date of Service: [name], [name] 12/09/2022 9:45 A M Medical Record Number: [id number] Patient Account Number: [id number] Date of Birth/Sex: Treating RN: Dec 21, 1945 (77 y.o. [name] Primary Care Naimah Yingst: Kara Dies Other Clinician: Referring [place] Lesure: Treating Caelan Branden/Extender: Claris Gower, Charanpreet Weeks in Treatment: 10 Visit Information History Since Last Visit Added or deleted any medications: No Patient Arrived: Ambulatory Any new allergies or adverse reactions: No Arrival Time: 10:01 Had a fall or experienced change in No Accompanied By: self activities of daily living that may affect Transfer Assistance: None risk of falls: Patient Identification Verified: Yes Hospitalized since last visit: No Secondary Verification Process Completed: Yes Has Dressing in Place as Prescribed: Yes Patient Has Alerts: Yes Pain Present Now: No Patient Alerts: type 2 diabetic ABI 09/29/22 [place]/[facility] [place] Electronic Signature(s) Signed: 12/09/2022 5:13:20 PM By: Angelina Pih Entered By: Angelina Pih on 12/09/2022 10:01:18 -------------------------------------------------------------------------------- Clinic Level of Care Assessment Details Patient Name: Date of Service: [name], [name]. 12/09/2022 9:45 A M Medical Record Number: [id number] Patient Account Number: [id number] Date of Birth/Sex: Treating RN: January 17, 1946 (77 y.o. [name] Primary Care [name]: Kara Dies Other Clinician: Referring Rhen Kawecki: Treating [name]/Extender: Claris Gower, Charanpreet Weeks in Treatment: 10 Clinic Level of Care Assessment Items TOOL 1 Quantity Score []  - 0 Use when EandM and Procedure is performed on INITIAL visit ASSESSMENTS - Nursing Assessment / Reassessment []  - 0 General Physical Exam (combine w/  comprehensive assessment (listed just below) when performed on new pt. evals) []  - 0 Comprehensive Assessment (HX, ROS, Risk Assessments, Wounds Hx, etc.) ASSESSMENTS - Wound and Skin Assessment / Reassessment []  - 0 Dermatologic / Skin Assessment (not related to wound area) ASSESSMENTS - Ostomy and/or Continence Assessment and Care Tschetter, Ameris [place] ([id number]) [id number] Page 2 of 9 []  - 0 Incontinence Assessment and Management []  - 0 Ostomy Care Assessment and Management (repouching, etc.) PROCESS - Coordination of Care []  - 0 Simple Patient / Family Education for ongoing care []  - 0 Complex (extensive) Patient / Family Education for ongoing care []  - 0 Staff obtains Chiropractor, Records, T Results / Process Orders est []  - 0 Staff telephones HHA, Nursing Homes / Clarify orders / etc []  - 0 Routine Transfer to another Facility (non-emergent condition) []  - 0 Routine Hospital Admission (non-emergent condition) []  - 0 New Admissions / Manufacturing engineer / Ordering NPWT Apligraf, etc. , []  - 0 Emergency Hospital Admission (emergent condition) PROCESS - Special Needs []  - 0 Pediatric / Minor Patient Management []  - 0 Isolation Patient Management []  - 0 Hearing / Language / Visual special needs []  - 0 Assessment of Community assistance (transportation, D/C planning, etc.) []  - 0 Additional assistance / Altered mentation []  - 0 Support Surface(s) Assessment (bed, cushion, seat, etc.) INTERVENTIONS - Miscellaneous []  - 0 External ear exam []  - 0 Patient Transfer (multiple staff / Nurse, adult / Similar devices) []  - 0 Simple Staple / Suture removal (25 or less) []  - 0 Complex Staple / Suture removal (26 or more) []  - 0 Hypo/Hyperglycemic Management (do not check if billed separately) []  - 0 Ankle / Brachial Index (ABI) - do not check if billed separately Has the patient been seen at the hospital within the last three years: Yes Total  Score: 0 Level Of Care: ____ Electronic Signature(s) Signed: 12/09/2022 5:13:20 PM By: Angelina Pih Entered By: Roger Shelter,  Caitlin on 12/09/2022 10:39:40 -------------------------------------------------------------------------------- Encounter Discharge Information Details Patient Name: Date of Service: [name], [name] 12/09/2022 9:45 A M Medical Record Number: [id number] Patient Account Number: [id number] Date of Birth/Sex: Treating RN: 07/02/1945 (77 y.o. [name] Primary Care Cortland Crehan: Kara Dies Other Clinician: Referring Beyza Bellino: Treating Brogan Martis/Extender: Lionel December Weeks in Treatment: 10 Encounter Discharge Information Items Post Procedure Vitals Discharge Condition: Stable Temperature (F): 97.6 [place], [name] ([id number]) [phone].pdf Page 3 of 9 Ambulatory Status: Ambulatory Pulse (bpm): 84 Discharge Destination: Home Respiratory Rate (breaths/min): 18 Transportation: Private Auto Blood Pressure (mmHg): 147/80 Accompanied By: self Schedule Follow-up Appointment: Yes Clinical Summary of Care: Electronic Signature(s) Signed: 12/09/2022 11:30:47 AM By: Angelina Pih Entered By: Angelina Pih on 12/09/2022 11:30:47 -------------------------------------------------------------------------------- Lower Extremity Assessment Details Patient Name: Date of Service: [name], [name]. 12/09/2022 9:45 A M Medical Record Number: [id number] Patient Account Number: [id number] Date of Birth/Sex: Treating RN: 1946-01-17 (77 y.o. [name] Primary Care [name]: Kara Dies Other Clinician: Referring Lisbet Busker: Treating Brayla Pat/Extender: Claris Gower, Charanpreet Weeks in Treatment: 10 Edema Assessment Assessed: [Left: No] [Right: No] Edema: [Left: N] [Right: o] Calf Left: Right: Point of Measurement: 30 cm From Medial Instep 28.2 cm Ankle Left: Right: Point of Measurement: 10 cm From  Medial Instep 18.8 cm Vascular Assessment Pulses: Dorsalis Pedis Palpable: [Right:Yes] Electronic Signature(s) Signed: 12/09/2022 5:13:20 PM By: Angelina Pih Entered By: Angelina Pih on 12/09/2022 10:07:00 Multi Wound Chart Details -------------------------------------------------------------------------------- Kathreen Devoid ([id number]) [id number] Page 4 of 9 Patient Name: Date of Service: [name], CLAYRE BRANDY 12/09/2022 9:45 A M Medical Record Number: [id number] Patient Account Number: [id number] Date of Birth/Sex: Treating RN: 06-02-1946 (77 y.o. [name] Primary Care Sam Overbeck: Kara Dies Other Clinician: Referring Krisha Beegle: Treating Azarius Lambson/Extender: Claris Gower, Charanpreet Weeks in Treatment: 10 Vital Signs Height(in): 63 Pulse(bpm): 84 Weight(lbs): 126 Blood Pressure(mmHg): 147/80 Body Mass Index(BMI): 22.3 Temperature(F): 97.6 Respiratory Rate(breaths/min): 18 Wound Assessments Wound Number: 1 2 N/A Photos: No Photos N/A Right, Medial Foot Right T Fifth oe N/A Wound Location: Gradually Appeared Gradually Appeared N/A Wounding Event: Diabetic Wound/Ulcer of the Lower Diabetic Wound/Ulcer of the Lower N/A Primary Etiology: Extremity Extremity Cataracts, Anemia, Hypertension, Cataracts, Anemia, Hypertension, N/A Comorbid History: Type II Diabetes, Rheumatoid Arthritis Type II Diabetes, Rheumatoid Arthritis 08/18/2022 12/09/2022 N/A Date Acquired: 10 0 N/A Weeks of Treatment: Open Open N/A Wound Status: No No N/A Wound Recurrence: 0.1x0.1x0.1 0.3x0.3x0.1 N/A Measurements [place] x W x D (cm) 0.008 0.071 N/A A (cm) : rea 0.001 0.007 N/A Volume (cm) : 98.90% N/A N/A % Reduction in A rea: 99.50% N/A N/A % Reduction in Volume: Grade 2 Grade 1 N/A Classification: None Present Medium N/A Exudate A mount: N/A Serosanguineous N/A Exudate Type: N/A red, brown N/A Exudate Color: None Present (0%) Large  (67-100%) N/A Granulation A mount: N/A Red N/A Granulation Quality: None Present (0%) Small (1-33%) N/A Necrotic A mount: Fat Layer (Subcutaneous Tissue): No Fat Layer (Subcutaneous Tissue): Yes N/A Exposed Structures: None None N/A Epithelialization: N/A Debridement - Excisional N/A Debridement: Pre-procedure Verification/Time Out N/A 10:35 N/A Taken: N/A Callus, Subcutaneous, Slough N/A Tissue Debrided: N/A Skin/Subcutaneous Tissue N/A Level: N/A 0.07 N/A Debridement A (sq cm): rea N/A Curette N/A Instrument: N/A Moderate N/A Bleeding: N/A Pressure N/A Hemostasis A chieved: N/A Procedure was tolerated well N/A Debridement Treatment Response: N/A 0.3x0.3x0.1 N/A Post Debridement Measurements [place] x W x D (cm) N/A 0.007 N/A Post Debridement Volume: (cm) N/A Debridement N/A Procedures  Performed: Treatment Notes Electronic Signature(s) Signed: 12/09/2022 11:29:14 AM By: Angelina Pih Previous Signature: 12/09/2022 10:11:15 AM Version By: Angelina Pih Entered By: Angelina Pih on 12/09/2022 11:29:14 Kathreen Devoid ([id number]) [id number] Page 5 of 9 -------------------------------------------------------------------------------- Multi-Disciplinary Care Plan Details Patient Name: Date of Service: [name], NYRIE LAUE 12/09/2022 9:45 A M Medical Record Number: [id number] Patient Account Number: [id number] Date of Birth/Sex: Treating RN: 1945/11/17 (77 y.o. [name] Primary Care Sruti Ayllon: Kara Dies Other Clinician: Referring Annaliza Zia: Treating Pantera Winterrowd/Extender: Claris Gower, Charanpreet Weeks in Treatment: 10 Active Inactive Wound/Skin Impairment Nursing Diagnoses: Impaired tissue integrity Knowledge deficit related to ulceration/compromised skin integrity Goals: Ulcer/skin breakdown will have a volume reduction of 30% by week 4 Date Initiated: 09/29/2022 Date Inactivated: 11/04/2022 Target Resolution Date:  10/27/2022 Goal Status: Met Ulcer/skin breakdown will have a volume reduction of 50% by week 8 Date Initiated: 09/29/2022 Date Inactivated: 11/25/2022 Target Resolution Date: 11/24/2022 Goal Status: Met Ulcer/skin breakdown will have a volume reduction of 80% by week 12 Date Initiated: 09/29/2022 Target Resolution Date: 12/22/2022 Goal Status: Active Ulcer/skin breakdown will heal within 14 weeks Date Initiated: 09/29/2022 Target Resolution Date: 01/05/2023 Goal Status: Active Interventions: Assess patient/caregiver ability to obtain necessary supplies Assess patient/caregiver ability to perform ulcer/skin care regimen upon admission and as needed Assess ulceration(s) every visit Provide education on ulcer and skin care Treatment Activities: Skin care regimen initiated : 09/29/2022 Notes: Electronic Signature(s) Signed: 12/09/2022 10:10:52 AM By: Angelina Pih Entered By: Angelina Pih on 12/09/2022 10:10:51 Pain Assessment Details -------------------------------------------------------------------------------- Kathreen Devoid ([id number]) [id number] Page 6 of 9 Patient Name: Date of Service: [name], BRITTNAY RIN 12/09/2022 9:45 A M Medical Record Number: [id number] Patient Account Number: [id number] Date of Birth/Sex: Treating RN: 07-Sep-1945 (77 y.o. [name] Primary Care Alvie Speltz: Kara Dies Other Clinician: Referring Nikolaus Pienta: Treating Frankie Zito/Extender: Claris Gower, Charanpreet Weeks in Treatment: 10 Active Problems Location of Pain Severity and Description of Pain Patient Has Paino No Site Locations Rate the pain. Current Pain Level: 0 Pain Management and Medication Current Pain Management: Electronic Signature(s) Signed: 12/09/2022 5:13:20 PM By: Angelina Pih Entered By: Angelina Pih on 12/09/2022 10:03:02 -------------------------------------------------------------------------------- Patient/Caregiver Education  Details Patient Name: Date of Service: [name], Maryem Richmond. 7/9/2024andnbsp9:45 A M Medical Record Number: [id number] Patient Account Number: [id number] Date of Birth/Gender: Treating RN: Oct 07, 1945 (77 y.o. [name] Primary Care Physician: Kara Dies Other Clinician: Referring Physician: Treating Physician/Extender: Lionel December Weeks in Treatment: 10 Education Assessment Education Provided To: Patient Education Topics Provided Wound/Skin Impairment: Handouts: Caring for Your Ulcer Methods: Explain/Verbal Responses: State content correctly KEIYANA, STUFFLEBEAM Richmond ([id number]) [phone].pdf Page 7 of 9 Electronic Signature(s) Signed: 12/09/2022 5:13:20 PM By: Angelina Pih Entered By: Angelina Pih on 12/09/2022 10:11:02 -------------------------------------------------------------------------------- Wound Assessment Details Patient Name: Date of Service: [name], Arienne Richmond. 12/09/2022 9:45 A M Medical Record Number: [id number] Patient Account Number: [id number] Date of Birth/Sex: Treating RN: [birth year] (77 y.o. [name] Primary Care Yuli Lanigan: Kara Dies Other Clinician: Referring Josilyn Shippee: Treating Dorothymae Maciver/Extender: Claris Gower, Charanpreet Weeks in Treatment: 10 Wound Status Wound Number: 1 Primary Diabetic Wound/Ulcer of the Lower Extremity Etiology: Wound Location: Right, Medial Foot Wound Status: Open Wounding Event: Gradually Appeared Comorbid Cataracts, Anemia, Hypertension, Type II Diabetes, Date Acquired: 08/18/2022 History: Rheumatoid Arthritis Weeks Of Treatment: 10 Clustered Wound: No Photos Wound Measurements Length: (cm) 0.1 Width: (cm) 0.1 Depth: (cm) 0.1 Area: (cm) 0.008 Volume: (cm) 0.001 % Reduction in Area: 98.9% % Reduction in Volume:  99.5% Epithelialization: None Tunneling: No Undermining: No Wound Description Classification: Grade 2 Exudate Amount: None  Present Foul Odor After Cleansing: No Slough/Fibrino No Wound Bed Granulation Amount: None Present (0%) Exposed Structure Necrotic Amount: None Present (0%) Fat Layer (Subcutaneous Tissue) Exposed: No Treatment Notes Wound #1 (Foot) Wound Laterality: Right, Medial Cleanser Byram Ancillary Kit - 15 Day Supply Discharge Instruction: Use supplies as instructed; Kit contains: (15) Saline Bullets; (15) 3x3 Gauze; 15 pr Gloves Soap and Water Vilardi, Eleasha Richmond ([id number]) [phone].pdf Page 8 of 9 Discharge Instruction: Gently cleanse wound with antibacterial soap, rinse and pat dry prior to dressing wounds Peri-Wound Care Topical Primary Dressing Xeroform-HBD 2x2 (in/in) Discharge Instruction: double layer Apply Xeroform-HBD 2x2 (in/in) as directed Secondary Dressing (BORDER) Zetuvit Plus SILICONE BORDER Dressing 4x4 (in/in) Discharge Instruction: Please do not put silicone bordered dressings under wraps. Use non-bordered dressing only. Secured With Compression Wrap Compression Stockings Add-Ons Electronic Signature(s) Signed: 12/09/2022 5:13:20 PM By: Angelina Pih Entered By: Angelina Pih on 12/09/2022 10:06:32 -------------------------------------------------------------------------------- Wound Assessment Details Patient Name: Date of Service: [name], Alysa Richmond. 12/09/2022 9:45 A M Medical Record Number: [id number] Patient Account Number: [id number] Date of Birth/Sex: Treating RN: 1946/03/24 (77 y.o. [name] Primary Care Tevan Marian: Kara Dies Other Clinician: Referring Analeese Andreatta: Treating Deniesha Stenglein/Extender: Claris Gower, Charanpreet Weeks in Treatment: 10 Wound Status Wound Number: 2 Primary Diabetic Wound/Ulcer of the Lower Extremity Etiology: Wound Location: Right T Fifth oe Wound Status: Open Wounding Event: Gradually Appeared Comorbid Cataracts, Anemia, Hypertension, Type II Diabetes, Date Acquired: 12/09/2022 History:  Rheumatoid Arthritis Weeks Of Treatment: 0 Clustered Wound: No Wound Measurements Length: (cm) 0.3 Width: (cm) 0.3 Depth: (cm) 0.1 Area: (cm) 0.071 Volume: (cm) 0.007 % Reduction in Area: % Reduction in Volume: Epithelialization: None Tunneling: No Undermining: No Wound Description Classification: Grade 1 Exudate Amount: Medium Exudate Type: Serosanguineous Exudate Color: red, brown Foul Odor After Cleansing: No Slough/Fibrino Yes Wound Bed Granulation Amount: Large (67-100%) Exposed Structure Granulation Quality: Red Fat Layer (Subcutaneous Tissue) Exposed: Yes Necrotic Amount: Small (1-33%) Necrotic Quality: Adherent Darryl Lent, Deshayla Richmond ([id number]) [phone].pdf Page 9 of 9 Treatment Notes Wound #2 (Toe Fifth) Wound Laterality: Right Cleanser Byram Ancillary Kit - 15 Day Supply Discharge Instruction: Use supplies as instructed; Kit contains: (15) Saline Bullets; (15) 3x3 Gauze; 15 pr Gloves Soap and Water Discharge Instruction: Gently cleanse wound with antibacterial soap, rinse and pat dry prior to dressing wounds Peri-Wound Care Topical Primary Dressing Xeroform-HBD 2x2 (in/in) Discharge Instruction: double layer Apply Xeroform-HBD 2x2 (in/in) as directed Secondary Dressing Coverlet Latex-Free Fabric Adhesive Dressings Discharge Instruction: Knuckle Secured With Compression Wrap Compression Stockings Add-Ons Electronic Signature(s) Signed: 12/09/2022 5:13:20 PM By: Angelina Pih Entered By: Angelina Pih on 12/09/2022 10:35:03 -------------------------------------------------------------------------------- Vitals Details Patient Name: Date of Service: [name], Sahej Richmond. 12/09/2022 9:45 A M Medical Record Number: [id number] Patient Account Number: [id number] Date of Birth/Sex: Treating RN: 29-Sep-1945 (77 y.o. [name] Primary Care Cyara Devoto: Kara Dies Other Clinician: Referring Kobi Aller: Treating  Sriansh Farra/Extender: Claris Gower, Charanpreet Weeks in Treatment: 10 Vital Signs Time Taken: 10:01 Temperature (F): 97.6 Height (in): 63 Pulse (bpm): 84 Weight (lbs): 126 Respiratory Rate (breaths/min): 18 Body Mass Index (BMI): 22.3 Blood Pressure (mmHg): 147/80 Reference Range: 80 - 120 mg / dl Electronic Signature(s) Signed: 12/09/2022 5:13:20 PM By: Angelina Pih Entered By: Angelina Pih on 12/09/2022 10:02:56

## 2022-12-18 ENCOUNTER — Encounter: Payer: Medicare PPO | Admitting: Physician Assistant

## 2022-12-18 DIAGNOSIS — I10 Essential (primary) hypertension: Secondary | ICD-10-CM | POA: Diagnosis not present

## 2022-12-18 DIAGNOSIS — E11621 Type 2 diabetes mellitus with foot ulcer: Secondary | ICD-10-CM | POA: Diagnosis not present

## 2022-12-18 DIAGNOSIS — E119 Type 2 diabetes mellitus without complications: Secondary | ICD-10-CM | POA: Diagnosis not present

## 2022-12-18 DIAGNOSIS — Z7984 Long term (current) use of oral hypoglycemic drugs: Secondary | ICD-10-CM | POA: Diagnosis not present

## 2022-12-18 DIAGNOSIS — M069 Rheumatoid arthritis, unspecified: Secondary | ICD-10-CM | POA: Diagnosis not present

## 2022-12-18 DIAGNOSIS — E1151 Type 2 diabetes mellitus with diabetic peripheral angiopathy without gangrene: Secondary | ICD-10-CM | POA: Diagnosis not present

## 2022-12-18 DIAGNOSIS — L97512 Non-pressure chronic ulcer of other part of right foot with fat layer exposed: Secondary | ICD-10-CM | POA: Diagnosis not present

## 2022-12-18 NOTE — Progress Notes (Addendum)
DEZIYAH, ARVIN ([id number]) [id number] Page 1 of 6 Visit Report for 12/18/2022 Chief Complaint Document Details Patient Name: Date of Service: [name], [name] 12/18/2022 9:45 A M Medical Record Number: [id number] Patient Account Number: [id number] Date of Birth/Sex: Treating RN: 1946/02/07 (77 y.o. Esmeralda Richmond Primary Care Provider: Kara Dies Other Clinician: Referring Provider: Treating Provider/Extender: Claris Gower, Charanpreet Weeks in Treatment: 11 Information Obtained from: Patient Chief Complaint Right foot ulcer Electronic Signature(s) Signed: 12/18/2022 10:07:30 AM By: Allen Derry PA-C Entered By: Allen Derry on 12/18/2022 10:07:30 -------------------------------------------------------------------------------- HPI Details Patient Name: Date of Service: [name], [name]. 12/18/2022 9:45 A M Medical Record Number: [id number] Patient Account Number: [id number] Date of Birth/Sex: Treating RN: 07/13/1945 (77 y.o. Esmeralda Richmond Primary Care Provider: Kara Dies Other Clinician: Referring Provider: Treating Provider/Extender: Claris Gower, Charanpreet Weeks in Treatment: 11 History of Present Illness HPI Description: 09-29-2022 upon evaluation today patient appears to be doing poorly currently in regard to her wound on her right foot medially just at the base of her great toe but more plantar aspect. Unfortunately this has been open since around March 18 according to what the patient tells me although she tells me that she had "a bump there before started opening and draining". Fortunately there does not appear to be any signs of systemic infection though locally this does appear to be quite deep just the one concern I have about the potential for osteomyelitis this is something we can have to keep close eye on and fully evaluate. Fortunately there does not appear to be any signs of active infection locally or  systemically which is great news. She does have a issue here with the ABIs she was noncompressible today so we were not able to determine an exact blood flow currently. With that being said she does have seemingly good capillary refill and likely good arterial flow to be able to heal this. Patient does have a history of diabetes mellitus type 2 with a hemoglobin A1c stated to be 6.0 on September 11, 2022. She also has a history of hypertension, peripheral vascular disease, and she is on oral hypoglycemic agents at this point. 10-06-2022 upon evaluation today patient appears to be doing decently well in regard to her wound she is making some progress which is good news. Fortunately there does not appear to be any signs of active infection locally nor systemically which is also great news. No fevers, chills, nausea, vomiting, or diarrhea. 10-13-2022 upon evaluation today patient appears to be doing well currently with regard to her wound. This is actually showing signs of improvement little by little I do feel like we are getting smaller week by week. Fortunately I do not see any signs of active infection locally or systemically which is great news. No fevers, chills, nausea, vomiting, or diarrhea. [name] ([id number]) [id number] Page 2 of 6 10-20-2022 upon evaluation today patient appears to be doing well currently in regard to her foot ulcer I am actually seeing signs of good improvement I am very pleased with what we are seeing here. Fortunately I do not see any evidence of worsening in general and I think that she is making good progress. 10-28-2022 upon evaluation today patient's wound actually showing signs of getting smaller were getting very close to closure though were not quite there yet. Fortunately I do not see any evidence of infection actively at this time which is great news. No fevers, chills, nausea, vomiting, or diarrhea.  11-04-2022 upon evaluation today  patient appears to be doing excellent in regard to her wound her MRI was reviewed and it was negative for any signs of osteomyelitis which is excellent news. There was some reactive changes but again with a wound present that has not out of the question and extraordinary. With that being said I do think that her wound is measuring much better she is very close to resolution though not completely done yet. 11-11-2022 upon evaluation today patient appears to be doing well currently in regard to her wound. In fact this is showing signs of improvement though she does have some callus I do not think this is completely closed yet but we are getting closer. 11-18-2022 upon evaluation today patient appears to be doing well currently in regard to her wound. She has been tolerating the dressing changes without complication. Fortunately there does not appear to be any signs of active infection at this time she has a little bit of callus buildup and need to work on that. 11-25-2022 upon evaluation today patient's wound still is continuing to progress it seems so close to complete resolution but is not quite done yet. Fortunately I do not see any signs of active infection locally or systemically which is great news. 12-02-2022 upon evaluation today patient appears to be doing well currently in regard to her wound. She has been tolerating the dressing changes although it still continues to get very dry. I do believe that we may want to use some Xeroform. I am going to clean this away and see if there is anything open but if it still open I think Xeroform is probably what I will switch her to. 12-09-2022 upon evaluation patient's wound actually appears to be doing better in regard to the foot. She has been tolerating the dressing changes and in general I think that we are on the right track towards closure in fact today I am hopeful that this wound that we have been treating may be closed. 12-18-2022 upon evaluation today  patient appears to be doing excellent in regard to her wound. She has been tolerating the dressing changes without complication and in general I do feel like you are making good headway towards complete closure. I do not see any evidence of active infection locally nor systemically at this time. Electronic Signature(s) Signed: 12/18/2022 10:27:56 AM By: Allen Derry PA-C Entered By: Allen Derry on 12/18/2022 10:27:56 -------------------------------------------------------------------------------- Physical Exam Details Patient Name: Date of Service: [name], [name]. 12/18/2022 9:45 A M Medical Record Number: [id number] Patient Account Number: [id number] Date of Birth/Sex: Treating RN: [birth year] (77 y.o. Esmeralda Richmond Primary Care Provider: Kara Dies Other Clinician: Referring Provider: Treating Provider/Extender: Claris Gower, Charanpreet Weeks in Treatment: 11 Constitutional Well-nourished and well-hydrated in no acute distress. Respiratory normal breathing without difficulty. Psychiatric this patient is able to make decisions and demonstrates good insight into disease process. Alert and Oriented x 3. pleasant and cooperative. Notes Upon inspection patient's wounds again both appear to be completely healed. I think that a protective dressing will probably be a good idea but in general I do not see anything that seems to be worsening overall. Electronic Signature(s) Signed: 12/18/2022 10:28:09 AM By: Allen Derry PA-C Entered By: Allen Derry on 12/18/2022 10:28:09 [name] ([id number]) [id number] Page 3 of 6 -------------------------------------------------------------------------------- Physician Orders Details Patient Name: Date of Service: [name], [name]. 12/18/2022 9:45 A M Medical Record Number: [id number] Patient Account Number: [id number] Date of Birth/Sex:  Treating RN: [birth year] (77 y.o. Esmeralda Richmond Primary  Care Provider: Kara Dies Other Clinician: Referring Provider: Treating Provider/Extender: Claris Gower, Charanpreet Weeks in Treatment: 11 Verbal / Phone Orders: No Diagnosis Coding ICD-10 Coding Code Description E11.621 Type 2 diabetes mellitus with foot ulcer L97.512 Non-pressure chronic ulcer of other part of right foot with fat layer exposed I10 Essential (primary) hypertension I73.89 Other specified peripheral vascular diseases Z79.84 Long term (current) use of oral hypoglycemic drugs Discharge From [facility] Services Discharge from [facility] Care Center Treatment Complete - Protective dressing to both healed sites for a week and then you no longer need to cover. Days with more than normal walking, suggest placing band aids over areas. long term you may benefit from corn or callus pads to prevent areas of rubbing. Please call us with any issues to healed areas. Electronic Signature(s) Signed: 12/18/2022 4:35:02 PM By: Angelina Pih Signed: 12/29/2022 3:00:23 PM By: Allen Derry PA-C Entered By: Angelina Pih on 12/18/2022 10:31:16 -------------------------------------------------------------------------------- Problem List Details Patient Name: Date of Service: [name], [name]. 12/18/2022 9:45 A M Medical Record Number: [id number] Patient Account Number: [id number] Date of Birth/Sex: Treating RN: 1945-09-01 (77 y.o. Esmeralda Richmond Primary Care Provider: Kara Dies Other Clinician: Referring Provider: Treating Provider/Extender: Claris Gower, Charanpreet Weeks in Treatment: 11 Active Problems ICD-10 Encounter Code Description Active Date MDM Diagnosis E11.621 Type 2 diabetes mellitus with foot ulcer 09/29/2022 No Yes [name], [name] ([id number]) [id number] Page 4 of 6 [phone] Non-pressure chronic ulcer of other part of right foot with fat layer exposed 09/29/2022 No Yes I10 Essential (primary) hypertension 09/29/2022 No  Yes I73.89 Other specified peripheral vascular diseases 09/29/2022 No Yes Z79.84 Long term (current) use of oral hypoglycemic drugs 09/29/2022 No Yes Inactive Problems Resolved Problems Electronic Signature(s) Signed: 12/18/2022 10:07:23 AM By: Allen Derry PA-C Entered By: Allen Derry on 12/18/2022 10:07:23 -------------------------------------------------------------------------------- Progress Note Details Patient Name: Date of Service: [name], [name]. 12/18/2022 9:45 A M Medical Record Number: [id number] Patient Account Number: [id number] Date of Birth/Sex: Treating RN: [birth year] (77 y.o. Esmeralda Richmond Primary Care Provider: Kara Dies Other Clinician: Referring Provider: Treating Provider/Extender: Claris Gower, Charanpreet Weeks in Treatment: 11 Subjective Chief Complaint Information obtained from Patient Right foot ulcer History of Present Illness (HPI) 09-29-2022 upon evaluation today patient appears to be doing poorly currently in regard to her wound on her right foot medially just at the base of her great toe but more plantar aspect. Unfortunately this has been open since around March 18 according to what the patient tells me although she tells me that she had "a bump there before started opening and draining". Fortunately there does not appear to be any signs of systemic infection though locally this does appear to be quite deep just the one concern I have about the potential for osteomyelitis this is something we can have to keep close eye on and fully evaluate. Fortunately there does not appear to be any signs of active infection locally or systemically which is great news. She does have a issue here with the ABIs she was noncompressible today so we were not able to determine an exact blood flow currently. With that being said she does have seemingly good capillary refill and likely good arterial flow to be able to heal this. Patient does have a history of  diabetes mellitus type 2 with a hemoglobin A1c stated to be 6.0 on September 11, 2022. She also has a history of hypertension, peripheral  vascular disease, and she is on oral hypoglycemic agents at this point. 10-06-2022 upon evaluation today patient appears to be doing decently well in regard to her wound she is making some progress which is good news. Fortunately there does not appear to be any signs of active infection locally nor systemically which is also great news. No fevers, chills, nausea, vomiting, or diarrhea. 10-13-2022 upon evaluation today patient appears to be doing well currently with regard to her wound. This is actually showing signs of improvement little by little I do feel like we are getting smaller week by week. Fortunately I do not see any signs of active infection locally or systemically which is great news. No fevers, chills, nausea, vomiting, or diarrhea. 10-20-2022 upon evaluation today patient appears to be doing well currently in regard to her foot ulcer I am actually seeing signs of good improvement I am very pleased with what we are seeing here. Fortunately I do not see any evidence of worsening in general and I think that she is making good progress. 10-28-2022 upon evaluation today patient's wound actually showing signs of getting smaller were getting very close to closure though were not quite there yet. Fortunately I do not see any evidence of infection actively at this time which is great news. No fevers, chills, nausea, vomiting, or diarrhea. [name], [name] ([id number]) [id number] Page 5 of 6 11-04-2022 upon evaluation today patient appears to be doing excellent in regard to her wound her MRI was reviewed and it was negative for any signs of osteomyelitis which is excellent news. There was some reactive changes but again with a wound present that has not out of the question and extraordinary. With that being said I do think that her wound is  measuring much better she is very close to resolution though not completely done yet. 11-11-2022 upon evaluation today patient appears to be doing well currently in regard to her wound. In fact this is showing signs of improvement though she does have some callus I do not think this is completely closed yet but we are getting closer. 11-18-2022 upon evaluation today patient appears to be doing well currently in regard to her wound. She has been tolerating the dressing changes without complication. Fortunately there does not appear to be any signs of active infection at this time she has a little bit of callus buildup and need to work on that. 11-25-2022 upon evaluation today patient's wound still is continuing to progress it seems so close to complete resolution but is not quite done yet. Fortunately I do not see any signs of active infection locally or systemically which is great news. 12-02-2022 upon evaluation today patient appears to be doing well currently in regard to her wound. She has been tolerating the dressing changes although it still continues to get very dry. I do believe that we may want to use some Xeroform. I am going to clean this away and see if there is anything open but if it still open I think Xeroform is probably what I will switch her to. 12-09-2022 upon evaluation patient's wound actually appears to be doing better in regard to the foot. She has been tolerating the dressing changes and in general I think that we are on the right track towards closure in fact today I am hopeful that this wound that we have been treating may be closed. 12-18-2022 upon evaluation today patient appears to be doing excellent in regard to her wound. She has been tolerating the  dressing changes without complication and in general I do feel like you are making good headway towards complete closure. I do not see any evidence of active infection locally nor systemically at this  time. Objective Constitutional Well-nourished and well-hydrated in no acute distress. Vitals Time Taken: 9:51 AM, Height: 63 in, Weight: 126 lbs, BMI: 22.3, Temperature: 98 F, Pulse: 86 bpm, Respiratory Rate: 18 breaths/min, Blood Pressure: 144/75 mmHg. Respiratory normal breathing without difficulty. Psychiatric this patient is able to make decisions and demonstrates good insight into disease process. Alert and Oriented x 3. pleasant and cooperative. General Notes: Upon inspection patient's wounds again both appear to be completely healed. I think that a protective dressing will probably be a good idea but in general I do not see anything that seems to be worsening overall. Integumentary (Hair, Skin) Wound #1 status is Healed - Epithelialized. Original cause of wound was Gradually Appeared. The date acquired was: 08/18/2022. The wound has been in treatment 11 weeks. The wound is located on the Right,Medial Foot. The wound measures 0cm length x 0cm width x 0cm depth; 0cm^2 area and 0cm^3 volume. There is no tunneling or undermining noted. There is a none present amount of drainage noted. There is no granulation within the wound bed. There is no necrotic tissue within the wound bed. Wound #2 status is Healed - Epithelialized. Original cause of wound was Gradually Appeared. The date acquired was: 12/09/2022. The wound has been in treatment 1 weeks. The wound is located on the Right T Fifth. The wound measures 0cm length x 0cm width x 0cm depth; 0cm^2 area and 0cm^3 volume. oe There is no tunneling or undermining noted. There is a none present amount of drainage noted. There is no granulation within the wound bed. There is no necrotic tissue within the wound bed. Assessment Active Problems ICD-10 Type 2 diabetes mellitus with foot ulcer Non-pressure chronic ulcer of other part of right foot with fat layer exposed Essential (primary) hypertension Other specified peripheral vascular  diseases Long term (current) use of oral hypoglycemic drugs Plan Discharge From [facility] Services: Discharge from Wound Care Center Treatment Complete - Protective dressing to both healed sites for a week and then you no longer need to cover. Days with more than normal walking, suggest placing band aids over areas. long term you may benefit from corn or callus pads to prevent areas of rubbing. Please call us with any issues to healed areas. [name], [name] ([id number]) [id number] Page 6 of 6 1. I would recommend that we have the patient continue to monitor for any evidence of infection or worsening. 2. The patient does appear to be completely healed we will have her continue currently with the wound care measures as before and she is in agreement with the plan this to just be protection at this point it does not appear to be any need for any debridement and I think she is completely healed. Will see her back for follow-up visit as needed. Electronic Signature(s) Signed: 12/18/2022 10:29:43 AM By: Allen Derry PA-C Entered By: Allen Derry on 12/18/2022 10:29:43 -------------------------------------------------------------------------------- SuperBill Details Patient Name: Date of Service: [name], [name]. 12/18/2022 Medical Record Number: [id number] Patient Account Number: [id number] Date of Birth/Sex: Treating RN: 01/23/1946 (77 y.o. Esmeralda Richmond Primary Care Provider: Kara Dies Other Clinician: Referring Provider: Treating Provider/Extender: Claris Gower, Charanpreet Weeks in Treatment: 11 Diagnosis Coding ICD-10 Codes Code Description E11.621 Type 2 diabetes mellitus with foot ulcer L97.512 Non-pressure chronic ulcer of other  part of right foot with fat layer exposed I10 Essential (primary) hypertension I73.89 Other specified peripheral vascular diseases Z79.84 Long term (current) use of oral hypoglycemic drugs Facility  Procedures : CPT4 Code: [id number] Description: [phone] - WOUND CARE VISIT-LEV 2 EST PT Modifier: Quantity: 1 Physician Procedures : CPT4 Code Description Modifier [id number] - WC PHYS LEVEL 3 - EST PT ICD-10 Diagnosis Description E11.621 Type 2 diabetes mellitus with foot ulcer L97.512 Non-pressure chronic ulcer of other part of right foot with fat layer exposed I10 Essential  (primary) hypertension I73.89 Other specified peripheral vascular diseases Quantity: 1 Electronic Signature(s) Signed: 12/18/2022 11:22:00 AM By: Angelina Pih Signed: 12/29/2022 3:00:23 PM By: Allen Derry PA-C Previous Signature: 12/18/2022 10:34:58 AM Version By: Allen Derry PA-C Entered By: Angelina Pih on 12/18/2022 11:22:00

## 2022-12-18 NOTE — Progress Notes (Signed)
BROOKIE, WAYMENT ([id number]) [id number] Page 1 of 9 Visit Report for 12/18/2022 Arrival Information Details Patient Name: Date of Service: [name], [name] 12/18/2022 9:45 A M Medical Record Number: [id number] Patient Account Number: [id number] Date of Birth/Sex: Treating RN: 10-27-1945 (77 y.o. [name] Primary Care Theresea Trautmann: Kara Dies Other Clinician: Referring Cordel Drewes: Treating Arieliz Latino/Extender: Claris Gower, Charanpreet Weeks in Treatment: 11 Visit Information History Since Last Visit Added or deleted any medications: No Patient Arrived: Ambulatory Any new allergies or adverse reactions: No Arrival Time: 09:51 Hospitalized since last visit: No Accompanied By: self Has Dressing in Place as Prescribed: Yes Transfer Assistance: None Pain Present Now: No Patient Identification Verified: Yes Secondary Verification Process Completed: Yes Patient Has Alerts: Yes Patient Alerts: type 2 diabetic ABI 09/29/22 L/R [place] Electronic Signature(s) Signed: 12/18/2022 4:35:02 PM By: Angelina Pih Entered By: Angelina Pih on 12/18/2022 09:51:50 -------------------------------------------------------------------------------- Clinic Level of Care Assessment Details Patient Name: Date of Service: [name], Anabel L. 12/18/2022 9:45 A M Medical Record Number: [id number] Patient Account Number: [id number] Date of Birth/Sex: Treating RN: 10-23-1945 (77 y.o. [name] Primary Care Radiance Deady: Kara Dies Other Clinician: Referring Timia Casselman: Treating Camillo Quadros/Extender: Claris Gower, Charanpreet Weeks in Treatment: 11 Clinic Level of Care Assessment Items TOOL 4 Quantity Score []  - 0 Use when only an EandM is performed on FOLLOW-UP visit ASSESSMENTS - Nursing Assessment / Reassessment X- 1 10 Reassessment of Co-morbidities (includes updates in patient status) X- 1 5 Reassessment of Adherence to Treatment Plan ASSESSMENTS  - Wound and Skin A ssessment / Reassessment []  - 0 Simple Wound Assessment / Reassessment - one wound Blatchford, Ymani L ([id number]) [id number] Page 2 of 9 []  - 0 Complex Wound Assessment / Reassessment - multiple wounds []  - 0 Dermatologic / Skin Assessment (not related to wound area) ASSESSMENTS - Focused Assessment []  - 0 Circumferential Edema Measurements - multi extremities []  - 0 Nutritional Assessment / Counseling / Intervention []  - 0 Lower Extremity Assessment (monofilament, tuning fork, pulses) []  - 0 Peripheral Arterial Disease Assessment (using hand held doppler) ASSESSMENTS - Ostomy and/or Continence Assessment and Care []  - 0 Incontinence Assessment and Management []  - 0 Ostomy Care Assessment and Management (repouching, etc.) PROCESS - Coordination of Care X - Simple Patient / Family Education for ongoing care 1 15 []  - 0 Complex (extensive) Patient / Family Education for ongoing care X- 1 10 Staff obtains Chiropractor, Records, T Results / Process Orders est []  - 0 Staff telephones HHA, Nursing Homes / Clarify orders / etc []  - 0 Routine Transfer to another Facility (non-emergent condition) []  - 0 Routine Hospital Admission (non-emergent condition) []  - 0 New Admissions / Manufacturing engineer / Ordering NPWT Apligraf, etc. , []  - 0 Emergency Hospital Admission (emergent condition) X- 1 10 Simple Discharge Coordination []  - 0 Complex (extensive) Discharge Coordination PROCESS - Special Needs []  - 0 Pediatric / Minor Patient Management []  - 0 Isolation Patient Management []  - 0 Hearing / Language / Visual special needs []  - 0 Assessment of Community assistance (transportation, D/C planning, etc.) []  - 0 Additional assistance / Altered mentation []  - 0 Support Surface(s) Assessment (bed, cushion, seat, etc.) INTERVENTIONS - Wound Cleansing / Measurement X - Simple Wound Cleansing - one wound 1 5 []  - 0 Complex Wound  Cleansing - multiple wounds X- 1 5 Wound Imaging (photographs - any number of wounds) []  - 0 Wound Tracing (instead of photographs) []  - 0 Simple Wound Measurement - one  wound []  - 0 Complex Wound Measurement - multiple wounds INTERVENTIONS - Wound Dressings []  - 0 Small Wound Dressing one or multiple wounds []  - 0 Medium Wound Dressing one or multiple wounds []  - 0 Large Wound Dressing one or multiple wounds X- 1 5 Application of Medications - topical []  - 0 Application of Medications - injection INTERVENTIONS - Miscellaneous []  - 0 External ear exam []  - 0 Specimen Collection (cultures, biopsies, blood, body fluids, etc.) []  - 0 Specimen(s) / Culture(s) sent or taken to Lab for analysis Pandya, Jaretssi L ([id number]) [phone].pdf Page 3 of 9 []  - 0 Patient Transfer (multiple staff / Nurse, adult / Similar devices) []  - 0 Simple Staple / Suture removal (25 or less) []  - 0 Complex Staple / Suture removal (26 or more) []  - 0 Hypo / Hyperglycemic Management (close monitor of Blood Glucose) []  - 0 Ankle / Brachial Index (ABI) - do not check if billed separately X- 1 5 Vital Signs Has the patient been seen at the hospital within the last three years: Yes Total Score: 70 Level Of Care: New/Established - Level 2 Electronic Signature(s) Signed: 12/18/2022 4:35:02 PM By: Angelina Pih Entered By: Angelina Pih on 12/18/2022 11:21:52 -------------------------------------------------------------------------------- Encounter Discharge Information Details Patient Name: Date of Service: [name], Laikyn L. 12/18/2022 9:45 A M Medical Record Number: [id number] Patient Account Number: [id number] Date of Birth/Sex: Treating RN: 1945-10-16 (77 y.o. [name] Primary Care Jarid Sasso: Kara Dies Other Clinician: Referring Dnya Hickle: Treating Adyn Hoes/Extender: Lionel December Weeks in Treatment: 11 Encounter Discharge  Information Items Discharge Condition: Stable Ambulatory Status: Ambulatory Discharge Destination: Home Transportation: Private Auto Accompanied By: self Schedule Follow-up Appointment: No Clinical Summary of Care: Electronic Signature(s) Signed: 12/18/2022 11:22:58 AM By: Angelina Pih Entered By: Angelina Pih on 12/18/2022 11:22:58 -------------------------------------------------------------------------------- Lower Extremity Assessment Details Patient Name: Date of Service: [name], Khushi L. 12/18/2022 9:45 A M Medical Record Number: [id number] Patient Account Number: [id number] Date of Birth/Sex: Treating RN: 1945-12-01 (77 y.o. [name] Primary Care Jerre Vandrunen: Kara Dies Other Clinician: KIANNAH, GRUNOW ([id number]) [id number] Page 4 of 9 Referring Kayana Thoen: Treating Cerita Rabelo/Extender: Claris Gower, Charanpreet Weeks in Treatment: 11 Edema Assessment Assessed: [Left: No] [Right: No] Edema: [Left: N] [Right: o] Vascular Assessment Pulses: Dorsalis Pedis Palpable: [Right:Yes] Electronic Signature(s) Signed: 12/18/2022 4:35:02 PM By: Angelina Pih Entered By: Angelina Pih on 12/18/2022 09:59:15 -------------------------------------------------------------------------------- Multi Wound Chart Details Patient Name: Date of Service: [name], Nija L. 12/18/2022 9:45 A M Medical Record Number: [id number] Patient Account Number: [id number] Date of Birth/Sex: Treating RN: 1945-11-05 (77 y.o. [name] Primary Care Runa Whittingham: Kara Dies Other Clinician: Referring Arden Tinoco: Treating Elaine Middleton/Extender: Claris Gower, Charanpreet Weeks in Treatment: 11 Vital Signs Height(in): 63 Pulse(bpm): 86 Weight(lbs): 126 Blood Pressure(mmHg): 144/75 Body Mass Index(BMI): 22.3 Temperature(F): 98 Respiratory Rate(breaths/min): 18 [1:Photos:] [N/A:N/A] Right, Medial Foot Right T Fifth oe N/A Wound  Location: Gradually Appeared Gradually Appeared N/A Wounding Event: Diabetic Wound/Ulcer of the Lower Diabetic Wound/Ulcer of the Lower N/A Primary Etiology: Extremity Extremity Cataracts, Anemia, Hypertension, Cataracts, Anemia, Hypertension, N/A Comorbid History: Type II Diabetes, Rheumatoid Arthritis Type II Diabetes, Rheumatoid Arthritis 08/18/2022 12/09/2022 N/A Date Acquired: 11 1 N/A Weeks of Treatment: Open Open N/A Wound Status: No No N/A Wound Recurrence: 0.1x0.1x0.1 0.1x0.1x0.1 N/A Measurements L x W x D (cm) 0.008 0.008 N/A A (cm) : rea 0.001 0.001 N/A Volume (cm) : 98.90% 88.70% N/A % Reduction in Area: 99.50% 85.70% N/A % Reduction in Volume:  Grade 2 Grade 1 N/A Classification: DESHONDRA, WORST ([id number]) [phone] Page 5 of 9 None Present None Present N/A Exudate A mount: None Present (0%) None Present (0%) N/A Granulation A mount: None Present (0%) None Present (0%) N/A Necrotic A mount: Fat Layer (Subcutaneous Tissue): No Fat Layer (Subcutaneous Tissue): No N/A Exposed Structures: Large (67-100%) Large (67-100%) N/A Epithelialization: Treatment Notes Electronic Signature(s) Signed: 12/18/2022 4:35:02 PM By: Angelina Pih Entered By: Angelina Pih on 12/18/2022 09:59:32 -------------------------------------------------------------------------------- Multi-Disciplinary Care Plan Details Patient Name: Date of Service: [name], Carmie L. 12/18/2022 9:45 A M Medical Record Number: [id number] Patient Account Number: [id number] Date of Birth/Sex: Treating RN: 03/17/1946 (77 y.o. [name] Primary Care Bodhi Moradi: Kara Dies Other Clinician: Referring Yonathan Perrow: Treating Rojelio Uhrich/Extender: Claris Gower, Charanpreet Weeks in Treatment: 11 Active Inactive Electronic Signature(s) Signed: 12/18/2022 11:22:12 AM By: Angelina Pih Entered By: Angelina Pih on 12/18/2022  11:22:12 -------------------------------------------------------------------------------- Pain Assessment Details Patient Name: Date of Service: [name], Indyah L. 12/18/2022 9:45 A M Medical Record Number: [id number] Patient Account Number: [id number] Date of Birth/Sex: Treating RN: Oct 08, 1945 (77 y.o. [name] Primary Care Asami Lambright: Kara Dies Other Clinician: Referring Annalissa Murphey: Treating Alease Fait/Extender: Claris Gower, Charanpreet Weeks in Treatment: 11 Active Problems Location of Pain Severity and Description of Pain Patient Has Paino No Site Locations Rate the pain. RASHAUNDA, RAHL ([id number]) [id number] Page 6 of 9 Rate the pain. Current Pain Level: 0 Pain Management and Medication Current Pain Management: Electronic Signature(s) Signed: 12/18/2022 4:35:02 PM By: Angelina Pih Entered By: Angelina Pih on 12/18/2022 09:53:35 -------------------------------------------------------------------------------- Patient/Caregiver Education Details Patient Name: Date of Service: [name], Larry L. 7/18/2024andnbsp9:45 A M Medical Record Number: [id number] Patient Account Number: [id number] Date of Birth/Gender: Treating RN: 09/26/1945 (77 y.o. [name] Primary Care Physician: Kara Dies Other Clinician: Referring Physician: Treating Physician/Extender: Lionel December Weeks in Treatment: 11 Education Assessment Education Provided To: Patient Education Topics Provided Wound/Skin Impairment: Handouts: Other: care of healed wounds Methods: Explain/Verbal Responses: State content correctly Electronic Signature(s) Signed: 12/18/2022 4:35:02 PM By: Angelina Pih Entered By: Angelina Pih on 12/18/2022 11:22:27 Kathreen Devoid ([id number]) [id number] Page 7 of 9 -------------------------------------------------------------------------------- Wound Assessment  Details Patient Name: Date of Service: [name], Dava L. 12/18/2022 9:45 A M Medical Record Number: [id number] Patient Account Number: [id number] Date of Birth/Sex: Treating RN: 27-Jun-1945 (77 y.o. [name] Primary Care Morgann Woodburn: Kara Dies Other Clinician: Referring Jahnay Lantier: Treating Oskar Cretella/Extender: Claris Gower, Charanpreet Weeks in Treatment: 11 Wound Status Wound Number: 1 Primary Diabetic Wound/Ulcer of the Lower Extremity Etiology: Wound Location: Right, Medial Foot Wound Status: Healed - Epithelialized Wounding Event: Gradually Appeared Comorbid Cataracts, Anemia, Hypertension, Type II Diabetes, Date Acquired: 08/18/2022 History: Rheumatoid Arthritis Weeks Of Treatment: 11 Clustered Wound: No Photos Wound Measurements Length: (cm) 0 Width: (cm) 0 Depth: (cm) 0 Area: (cm) Volume: (cm) % Reduction in Area: 100% % Reduction in Volume: 100% Epithelialization: Large (67-100%) 0 Tunneling: No 0 Undermining: No Wound Description Classification: Grade 2 Exudate Amount: None Present Foul Odor After Cleansing: No Slough/Fibrino No Wound Bed Granulation Amount: None Present (0%) Exposed Structure Necrotic Amount: None Present (0%) Fat Layer (Subcutaneous Tissue) Exposed: No Treatment Notes Wound #1 (Foot) Wound Laterality: Right, Medial Cleanser Peri-Wound Care Topical Primary Dressing Secondary Dressing Secured With Compression Wrap Mchugh, Tziporah L ([id number]) [id number] Page 8 of 9 Compression Stockings Add-Ons Electronic Signature(s) Signed: 12/18/2022 4:35:02 PM By: Angelina Pih Entered By: Angelina Pih on 12/18/2022 10:22:34 -------------------------------------------------------------------------------- Wound Assessment  Details Patient Name: Date of Service: [name], AZALYN SLIWA 12/18/2022 9:45 A M Medical Record Number: [id number] Patient Account Number: [id number] Date of Birth/Sex: Treating  RN: 28-May-1946 (77 y.o. [name] Primary Care Heavyn Yearsley: Kara Dies Other Clinician: Referring Klarissa Mcilvain: Treating Skai Lickteig/Extender: Claris Gower, Charanpreet Weeks in Treatment: 11 Wound Status Wound Number: 2 Primary Diabetic Wound/Ulcer of the Lower Extremity Etiology: Wound Location: Right T Fifth oe Wound Status: Healed - Epithelialized Wounding Event: Gradually Appeared Comorbid Cataracts, Anemia, Hypertension, Type II Diabetes, Date Acquired: 12/09/2022 History: Rheumatoid Arthritis Weeks Of Treatment: 1 Clustered Wound: No Photos Wound Measurements Length: (cm) Width: (cm) Depth: (cm) Area: (cm) Volume: (cm) 0 % Reduction in Area: 100% 0 % Reduction in Volume: 100% 0 Epithelialization: Large (67-100%) 0 Tunneling: No 0 Undermining: No Wound Description Classification: Grade 1 Exudate Amount: None Present Foul Odor After Cleansing: No Slough/Fibrino No Wound Bed Granulation Amount: None Present (0%) Exposed Structure Necrotic Amount: None Present (0%) Fat Layer (Subcutaneous Tissue) Exposed: No Treatment Notes Wound #2 (Toe Fifth) Wound Laterality: Right Morden, Cristal L ([id number]) [id number] Page 9 of 9 Cleanser Peri-Wound Care Topical Primary Dressing Secondary Dressing Secured With Compression Wrap Compression Stockings Add-Ons Electronic Signature(s) Signed: 12/18/2022 4:35:02 PM By: Angelina Pih Entered By: Angelina Pih on 12/18/2022 10:22:45 -------------------------------------------------------------------------------- Vitals Details Patient Name: Date of Service: [name], Laverna L. 12/18/2022 9:45 A M Medical Record Number: [id number] Patient Account Number: [id number] Date of Birth/Sex: Treating RN: [birth year] (77 y.o. [name] Primary Care Ginnifer Creelman: Kara Dies Other Clinician: Referring Alaena Strader: Treating Gunhild Bautch/Extender: Claris Gower, Charanpreet Weeks in  Treatment: 11 Vital Signs Time Taken: 09:51 Temperature (F): 98 Height (in): 63 Pulse (bpm): 86 Weight (lbs): 126 Respiratory Rate (breaths/min): 18 Body Mass Index (BMI): 22.3 Blood Pressure (mmHg): 144/75 Reference Range: 80 - 120 mg / dl Electronic Signature(s) Signed: 12/18/2022 4:35:02 PM By: Angelina Pih Entered By: Angelina Pih on 12/18/2022 09:53:20

## 2022-12-29 ENCOUNTER — Encounter: Payer: Self-pay | Admitting: Nurse Practitioner

## 2022-12-31 ENCOUNTER — Other Ambulatory Visit: Payer: Self-pay | Admitting: Nurse Practitioner

## 2022-12-31 DIAGNOSIS — Z1231 Encounter for screening mammogram for malignant neoplasm of breast: Secondary | ICD-10-CM

## 2023-01-12 DIAGNOSIS — E113211 Type 2 diabetes mellitus with mild nonproliferative diabetic retinopathy with macular edema, right eye: Secondary | ICD-10-CM | POA: Diagnosis not present

## 2023-01-12 LAB — HM DIABETES EYE EXAM

## 2023-01-16 ENCOUNTER — Ambulatory Visit
Admission: RE | Admit: 2023-01-16 | Discharge: 2023-01-16 | Disposition: A | Discharge status: Home / Self Care | Payer: Medicare PPO | Source: Ambulatory Visit | Attending: Nurse Practitioner | Admitting: Nurse Practitioner

## 2023-01-16 DIAGNOSIS — Z1231 Encounter for screening mammogram for malignant neoplasm of breast: Secondary | ICD-10-CM | POA: Insufficient documentation

## 2023-01-20 ENCOUNTER — Other Ambulatory Visit: Payer: Self-pay | Admitting: Nurse Practitioner

## 2023-01-20 NOTE — Progress Notes (Signed)
Please inform the patient: The mammogram is normal. Please continue regular screening.

## 2023-01-29 ENCOUNTER — Telehealth: Payer: Self-pay | Admitting: Nurse Practitioner

## 2023-01-29 NOTE — Telephone Encounter (Signed)
Copied from CRM [id number]. Topic: Medicare AWV >> Jan 29, 2023 11:49 AM Payton Doughty wrote: Reason for CRM: Called 01/29/2023 to sched AWV - NO VOICEMAIL  Verlee Rossetti; Care Guide Ambulatory Clinical Support [facility] Direct Dial: [phone]

## 2023-02-04 ENCOUNTER — Other Ambulatory Visit: Payer: Self-pay | Admitting: Nurse Practitioner

## 2023-03-09 DIAGNOSIS — E113211 Type 2 diabetes mellitus with mild nonproliferative diabetic retinopathy with macular edema, right eye: Secondary | ICD-10-CM | POA: Diagnosis not present

## 2023-03-21 ENCOUNTER — Other Ambulatory Visit: Payer: Self-pay | Admitting: Internal Medicine

## 2023-03-21 DIAGNOSIS — E1143 Type 2 diabetes mellitus with diabetic autonomic (poly)neuropathy: Secondary | ICD-10-CM

## 2023-03-26 DIAGNOSIS — H40003 Preglaucoma, unspecified, bilateral: Secondary | ICD-10-CM | POA: Diagnosis not present

## 2023-03-26 DIAGNOSIS — H35379 Puckering of macula, unspecified eye: Secondary | ICD-10-CM | POA: Diagnosis not present

## 2023-03-26 DIAGNOSIS — H16003 Unspecified corneal ulcer, bilateral: Secondary | ICD-10-CM | POA: Diagnosis not present

## 2023-03-26 DIAGNOSIS — Z0100 Encounter for examination of eyes and vision without abnormal findings: Secondary | ICD-10-CM | POA: Diagnosis not present

## 2023-03-26 DIAGNOSIS — E113211 Type 2 diabetes mellitus with mild nonproliferative diabetic retinopathy with macular edema, right eye: Secondary | ICD-10-CM | POA: Diagnosis not present

## 2023-03-31 ENCOUNTER — Other Ambulatory Visit: Payer: Self-pay

## 2023-03-31 ENCOUNTER — Telehealth: Payer: Self-pay

## 2023-03-31 DIAGNOSIS — Z860100 Personal history of colon polyps, unspecified: Secondary | ICD-10-CM

## 2023-03-31 MED ORDER — NA SULFATE-K SULFATE-MG SULF 17.5-3.13-1.6 GM/177ML PO SOLN: 1.0000 | Freq: Once | ORAL | 0 refills | Status: AC

## 2023-03-31 NOTE — Telephone Encounter (Signed)
Gastroenterology Pre-Procedure Review  Request Date: 04/28/23 Requesting Physician: Dr. Tobi Bastos  PATIENT REVIEW QUESTIONS: The patient responded to the following health history questions as indicated:    1. Are you having any GI issues? no 2. Do you have a personal history of Polyps? Yes last colonoscopy 12/30/2019 3. Do you have a family history of Colon Cancer or Polyps? no 4. Diabetes Mellitus? yes (takes metformin has been advised to stop it 2 days before) 5. Joint replacements in the past 12 months?no 6. Major health problems in the past 3 months?no 7. Any artificial heart valves, MVP, or defibrillator?no    MEDICATIONS & ALLERGIES:    Patient reports the following regarding taking any anticoagulation/antiplatelet therapy:   Plavix, Coumadin, Eliquis, Xarelto, Lovenox, Pradaxa, Brilinta, or Effient? no Aspirin? no  Patient confirms/reports the following medications:  Current Outpatient Medications  Medication Sig Dispense Refill   Na Sulfate-K Sulfate-Mg Sulf 17.5-3.13-1.6 GM/177ML SOLN Take 1 kit by mouth once for 1 dose. 354 mL 0   ACCU-CHEK AVIVA PLUS test strip TEST BLOOD SUGAR EVERY DAY 100 strip 10   Accu-Chek Softclix Lancets lancets CHECK BLOOD SUGAR EVERY DAY 100 each 6   acetaminophen (TYLENOL) 500 MG tablet Take 500 mg by mouth every 6 (six) hours as needed for moderate pain or headache.     alendronate (FOSAMAX) 70 MG tablet TAKE 1 TABLET (70 MG TOTAL) BY MOUTH ONCE A WEEK. TAKE WITH A FULL GLASS OF WATER ON AN EMPTY STOMACH. 12 tablet 0   amLODipine (NORVASC) 5 MG tablet Take 1 tablet (5 mg total) by mouth daily. 90 tablet 2   atorvastatin (LIPITOR) 10 MG tablet Take 1 tablet (10 mg total) by mouth daily. 90 tablet 3   Blood Glucose Monitoring Suppl (ACCU-CHEK GUIDE ME) w/Device KIT 1 Device by Does not apply route 4 (four) times daily as needed. 1 kit 0   Calcium Carbonate (CALCIUM 600 PO) Take by mouth daily.     Cholecalciferol (VITAMIN D3) 50 MCG (2000 UT) capsule  Take 2,000 Units by mouth daily.     hydrochlorothiazide (HYDRODIURIL) 25 MG tablet Take 1 tablet (25 mg total) by mouth daily. 90 tablet 2   metFORMIN (GLUCOPHAGE) 500 MG tablet Take 4 tablets by mouth every day. 360 tablet 3   Multiple Vitamin (MULTIVITAMIN WITH MINERALS) TABS tablet Take 1 tablet by mouth daily.     mupirocin ointment (BACTROBAN) 2 % Apply 1 Application topically 2 (two) times daily. 22 g 0   Polyethyl Glycol-Propyl Glycol (SYSTANE) 0.4-0.3 % SOLN Apply to eye.     potassium chloride SA (KLOR-CON) 20 MEQ tablet Take 1 tablet (20 mEq total) by mouth 2 (two) times daily. 30 tablet 4   vitamin B-12 (CYANOCOBALAMIN) 1000 MCG tablet Take 1,000 mcg by mouth daily.     No current facility-administered medications for this visit.    Patient confirms/reports the following allergies:  Allergies  Allergen Reactions   Oxycodone Nausea And Vomiting    Orders Placed This Encounter  Procedures   Ambulatory referral to Gastroenterology    Referral Priority:   Routine    Referral Type:   Consultation    Referral Reason:   Specialty Services Required    Referred to Provider:   Wyline Mood, MD    Number of Visits Requested:   1    AUTHORIZATION INFORMATION Primary Insurance: 1D#: Group #:  Secondary Insurance: 1D#: Group #:  SCHEDULE INFORMATION: Date: 04/28/23 Time: Location: [facility]

## 2023-03-31 NOTE — Telephone Encounter (Signed)
Patient called in to schedule repeat colonoscopy. Per Dr. Maximino Greenland I recommend you have a repeat colonoscopy in 3 years to determine if you have developed any new polyps and to screen for colorectal cancer.

## 2023-04-09 DIAGNOSIS — Z23 Encounter for immunization: Secondary | ICD-10-CM | POA: Diagnosis not present

## 2023-04-15 ENCOUNTER — Other Ambulatory Visit: Payer: Self-pay | Admitting: Nurse Practitioner

## 2023-04-28 ENCOUNTER — Encounter
Admission: RE | Disposition: A | Discharge status: Home / Self Care | Payer: Self-pay | Source: Home / Self Care | Attending: Gastroenterology

## 2023-04-28 ENCOUNTER — Encounter: Payer: Self-pay | Admitting: Gastroenterology

## 2023-04-28 ENCOUNTER — Ambulatory Visit
Admission: RE | Admit: 2023-04-28 | Discharge: 2023-04-28 | Disposition: A | Discharge status: Home / Self Care | Payer: Medicare PPO | Attending: Gastroenterology | Admitting: Gastroenterology

## 2023-04-28 ENCOUNTER — Other Ambulatory Visit: Payer: Self-pay

## 2023-04-28 ENCOUNTER — Ambulatory Visit: Payer: Medicare PPO | Admitting: Anesthesiology

## 2023-04-28 DIAGNOSIS — Z860100 Personal history of colon polyps, unspecified: Secondary | ICD-10-CM | POA: Diagnosis not present

## 2023-04-28 DIAGNOSIS — I10 Essential (primary) hypertension: Secondary | ICD-10-CM | POA: Insufficient documentation

## 2023-04-28 DIAGNOSIS — K5730 Diverticulosis of large intestine without perforation or abscess without bleeding: Secondary | ICD-10-CM | POA: Insufficient documentation

## 2023-04-28 DIAGNOSIS — Z860101 Personal history of adenomatous and serrated colon polyps: Secondary | ICD-10-CM | POA: Diagnosis not present

## 2023-04-28 DIAGNOSIS — Z09 Encounter for follow-up examination after completed treatment for conditions other than malignant neoplasm: Secondary | ICD-10-CM | POA: Insufficient documentation

## 2023-04-28 DIAGNOSIS — Z1211 Encounter for screening for malignant neoplasm of colon: Secondary | ICD-10-CM | POA: Insufficient documentation

## 2023-04-28 HISTORY — PX: COLONOSCOPY WITH PROPOFOL: SHX5780

## 2023-04-28 LAB — GLUCOSE, CAPILLARY: Glucose-Capillary: 111 mg/dL — ABNORMAL HIGH (ref 70–99)

## 2023-04-28 SURGERY — COLONOSCOPY WITH PROPOFOL
Anesthesia: General

## 2023-04-28 MED ORDER — SODIUM CHLORIDE 0.9 % IV SOLN
INTRAVENOUS | Status: DC
Start: 2023-04-28 — End: 2023-04-28

## 2023-04-28 MED ORDER — LIDOCAINE HCL (CARDIAC) PF 100 MG/5ML IV SOSY
PREFILLED_SYRINGE | INTRAVENOUS | Status: DC | PRN
  Administered 2023-04-28: 60 mg via INTRAVENOUS

## 2023-04-28 MED ORDER — PROPOFOL 500 MG/50ML IV EMUL
INTRAVENOUS | Status: DC | PRN
  Administered 2023-04-28: 100 ug/kg/min via INTRAVENOUS
  Administered 2023-04-28: 100 mg via INTRAVENOUS

## 2023-04-28 NOTE — Anesthesia Preprocedure Evaluation (Signed)
Anesthesia Evaluation  Patient identified by MRN, date of birth, ID band Patient awake    Reviewed: Allergy & Precautions, H&P , NPO status , Patient's Chart, lab work & pertinent test results  History of Anesthesia Complications Negative for: history of anesthetic complications  Airway Mallampati: II  TM Distance: >3 FB Neck ROM: full    Dental  (+) Upper Dentures, Lower Dentures, Dental Advidsory Given   Pulmonary neg pulmonary ROS   breath sounds clear to auscultation       Cardiovascular Exercise Tolerance: Good hypertension, (-) angina (-) Past MI and (-) Cardiac Stents (-) dysrhythmias (-) Valvular Problems/Murmurs Rhythm:regular Rate:Normal     Neuro/Psych negative neurological ROS  negative psych ROS   GI/Hepatic negative GI ROS, Neg liver ROS,,,  Endo/Other  diabetes    Renal/GU negative Renal ROS  negative genitourinary   Musculoskeletal  (+) Arthritis , Rheumatoid disorders,    Abdominal   Peds  Hematology  (+) Blood dyscrasia, anemia   Anesthesia Other Findings Past Medical History: No date: Anemia No date: Arthritis     Comment:  hands and knees 01/30/2016: Colon polyp     Comment:  Tubulovillous adenoma 2016: Diabetes mellitus without complication (HCC) 09/19/2019: Diabetic eye exam (HCC)     Comment:  Performed at [facility] Background diabetic               retinopathy & diabetic macular edema, follow up one with               Dr. Chipper Herb No date: Hypertension No date: Wears dentures     Comment:  full upper and lower  Past Surgical History: 07/04/2018: ANTERIOR APPROACH HEMI HIP ARTHROPLASTY; Right     Comment:  [facility] 08/05/2016: CATARACT EXTRACTION W/PHACO; Right     Comment:  Procedure: CATARACT EXTRACTION PHACO AND INTRAOCULAR               LENS PLACEMENT (IOC);  Surgeon: Galen Manila, MD;                Location: [facility] ORS;  Service: Ophthalmology;  Laterality:               Right;  [place] 39.5 AP% 21.4 CDE 8.45 Fluid pack lot #               [id number] 02/01/2019: CATARACT EXTRACTION W/PHACO; Left     Comment:  Procedure: CATARACT EXTRACTION PHACO AND INTRAOCULAR               LENS PLACEMENT (IOC) LEFT DIABETIC  2:00.2  15.0%  18.58;              Surgeon: Galen Manila, MD;  Location: [facility];  Service: Ophthalmology;  Laterality: Left;                Diabeetic - oral meds 01/09/2016: COLONOSCOPY WITH PROPOFOL; N/A     Comment:  Procedure: COLONOSCOPY WITH PROPOFOL;  Surgeon: Christena Deem, MD;  Location: [facility] ENDOSCOPY;  Service:               Endoscopy;  Laterality: N/A; 01/28/2017: COLONOSCOPY WITH PROPOFOL; N/A     Comment:  Procedure: COLONOSCOPY WITH PROPOFOL;  Surgeon: Kieth Brightly, MD;  Location:  [facility] ENDOSCOPY;  Service:               Endoscopy;  Laterality: N/A; 01/30/2016: LAPAROSCOPIC SIGMOID COLECTOMY; N/A     Comment:  Tubulovillous adenoma     Reproductive/Obstetrics negative OB ROS                             Anesthesia Physical Anesthesia Plan  ASA: 3  Anesthesia Plan: General   Post-op Pain Management:    Induction: Intravenous  PONV Risk Score and Plan: Propofol infusion and TIVA  Airway Management Planned: Nasal Cannula and Natural Airway  Additional Equipment:   Intra-op Plan:   Post-operative Plan:   Informed Consent: I have reviewed the patients History and Physical, chart, labs and discussed the procedure including the risks, benefits and alternatives for the proposed anesthesia with the patient or authorized representative who has indicated his/her understanding and acceptance.     Dental Advisory Given  Plan Discussed with: Anesthesiologist, CRNA and Surgeon  Anesthesia Plan Comments:         Anesthesia Quick Evaluation

## 2023-04-28 NOTE — Transfer of Care (Signed)
Immediate Anesthesia Transfer of Care Note  Patient: [name]  Procedure(s) Performed: COLONOSCOPY WITH PROPOFOL  Patient Location: PACU  Anesthesia Type:General  Level of Consciousness: awake, alert , and oriented  Airway & Oxygen Therapy: Patient Spontanous Breathing  Post-op Assessment: Report given to RN and Post -op Vital signs reviewed and stable  Post vital signs: Reviewed and stable  Last Vitals: See PACU flow sheet All VSS  Pt awake and talking.  Vitals Value Taken Time  BP    Temp 36.2 C 04/28/23 1032  Pulse    Resp    SpO2      Last Pain:  Vitals:   04/28/23 0925  TempSrc: Temporal  PainSc: 0-No pain         Complications: No notable events documented.

## 2023-04-28 NOTE — Op Note (Signed)
[facility] Gastroenterology Patient Name: [name] Procedure Date: 04/28/2023 10:08 AM MRN: [id number] Account #: [id number] Date of Birth: [birth year] Admit Type: Outpatient Age: 77 Room: [facility] ENDO ROOM 2 Gender: Female Note Status: Finalized Instrument Name: Peds Colonoscope [id number] Procedure:             Colonoscopy Indications:           Surveillance: Personal history of adenomatous polyps                         on last colonoscopy > 5 years ago Providers:             Wyline Mood MD, MD Referring MD:          Wyline Mood MD, MD (Referring MD), Kara Dies                         (Referring MD) Medicines:             Monitored Anesthesia Care Complications:         No immediate complications. Procedure:             Pre-Anesthesia Assessment:                        - Prior to the procedure, a History and Physical was                         performed, and patient medications, allergies and                         sensitivities were reviewed. The patient's tolerance                         of previous anesthesia was reviewed.                        - The risks and benefits of the procedure and the                         sedation options and risks were discussed with the                         patient. All questions were answered and informed                         consent was obtained.                        - The risks and benefits of the procedure and the                         sedation options and risks were discussed with the                         patient. All questions were answered and informed                         consent was obtained.  After obtaining informed consent, the colonoscope was                         passed under direct vision. Throughout the procedure,                         the patient's blood pressure, pulse, and oxygen                         saturations were monitored continuously. The                          Colonoscope was introduced through the anus and                         advanced to the the cecum, identified by the                         appendiceal orifice. The colonoscopy was performed                         with ease. The patient tolerated the procedure well.                         The quality of the bowel preparation was excellent.                         The ileocecal valve, appendiceal orifice, and rectum                         were photographed. Findings:      The perianal and digital rectal examinations were normal.      Multiple large-mouthed diverticula were found in the sigmoid colon.      The exam was otherwise without abnormality on direct and retroflexion       views. Impression:            - Diverticulosis in the sigmoid colon.                        - The examination was otherwise normal on direct and                         retroflexion views.                        - No specimens collected. Recommendation:        - Discharge patient to home (with escort).                        - Resume previous diet.                        - Continue present medications.                        - Repeat colonoscopy is not recommended due to current                         age (36 years or older) for surveillance. Procedure Code(s):     ---  Professional ---                        [phone], Colonoscopy, flexible; diagnostic, including                         collection of specimen(s) by brushing or washing, when                         performed (separate procedure) Diagnosis Code(s):     --- Professional ---                        Z86.010, Personal history of colonic polyps                        K57.30, Diverticulosis of large intestine without                         perforation or abscess without bleeding CPT copyright 2022 American [employer] Association. All rights reserved. The codes documented in this report are preliminary and upon coder review may  be revised  to meet current compliance requirements. Wyline Mood, MD Wyline Mood MD, MD 04/28/2023 10:32:50 AM This report has been signed electronically. Number of Addenda: 0 Note Initiated On: 04/28/2023 10:08 AM Scope Withdrawal Time: 0 hours 8 minutes 30 seconds  Total Procedure Duration: 0 hours 11 minutes 15 seconds  Estimated Blood Loss:  Estimated blood loss: none.      [facility]

## 2023-04-28 NOTE — H&P (Signed)
Hannah Mood, MD [address], [place], [place], [address] [address], [place], [place], [address] Phone: [phone]  Fax: [phone]  Primary Care Physician:  Kara Dies, NP   Pre-Procedure History & Physical: HPI:  [name] is a 77 y.o. female is here for an colonoscopy.   Past Medical History:  Diagnosis Date   Anemia    Arthritis    hands and knees   Colon polyp 01/30/2016   Tubulovillous adenoma   Diabetes mellitus without complication (HCC) 2016   Diabetic eye exam (HCC) 09/19/2019   Performed at [facility].. Background diabetic retinopathy & diabetic macular edema, follow up one with Dr. Chipper Herb   Hypertension    Wears dentures    full upper and lower    Past Surgical History:  Procedure Laterality Date   ANTERIOR APPROACH HEMI HIP ARTHROPLASTY Right 07/04/2018   [facility]   CATARACT EXTRACTION W/PHACO Right 08/05/2016   Procedure: CATARACT EXTRACTION PHACO AND INTRAOCULAR LENS PLACEMENT (IOC);  Surgeon: Galen Manila, MD;  Location: [facility] ORS;  Service: Ophthalmology;  Laterality: Right;  [place] 39.5 AP% 21.4 CDE 8.45 Fluid pack lot # [id number]   CATARACT EXTRACTION W/PHACO Left 02/01/2019   Procedure: CATARACT EXTRACTION PHACO AND INTRAOCULAR LENS PLACEMENT (IOC) LEFT DIABETIC  2:00.2  15.0%  18.58;  Surgeon: Galen Manila, MD;  Location: [facility];  Service: Ophthalmology;  Laterality: Left;  Diabeetic - oral meds   COLONOSCOPY WITH PROPOFOL N/A 01/09/2016   Procedure: COLONOSCOPY WITH PROPOFOL;  Surgeon: Christena Deem, MD;  Location: [facility] ENDOSCOPY;  Service: Endoscopy;  Laterality: N/A;   COLONOSCOPY WITH PROPOFOL N/A 01/28/2017   Procedure: COLONOSCOPY WITH PROPOFOL;  Surgeon: Kieth Brightly, MD;  Location: [facility] ENDOSCOPY;  Service: Endoscopy;  Laterality: N/A;   COLONOSCOPY WITH PROPOFOL N/A 12/30/2019   Procedure: COLONOSCOPY WITH PROPOFOL;  Surgeon: Pasty Spillers, MD;  Location: [facility] ENDOSCOPY;   Service: Endoscopy;  Laterality: N/A;   LAPAROSCOPIC SIGMOID COLECTOMY N/A 01/30/2016   Tubulovillous adenoma    Prior to Admission medications   Medication Sig Start Date End Date Taking? Authorizing Provider  acetaminophen (TYLENOL) 500 MG tablet Take 500 mg by mouth every 6 (six) hours as needed for moderate pain or headache.   Yes [provider]  alendronate (FOSAMAX) 70 MG tablet TAKE 1 TABLET (70 MG TOTAL) BY MOUTH ONCE A WEEK. TAKE WITH A FULL GLASS OF WATER ON AN EMPTY STOMACH. 04/16/23  Yes Kara Dies, NP  ACCU-CHEK AVIVA PLUS test strip TEST BLOOD SUGAR EVERY DAY 03/31/22   Hannah Downs, MD  Accu-Chek Softclix Lancets lancets CHECK BLOOD SUGAR EVERY DAY 11/07/19   Hannah Downs, MD  amLODipine (NORVASC) 5 MG tablet Take 1 tablet (5 mg total) by mouth daily. 09/11/22   Kara Dies, NP  atorvastatin (LIPITOR) 10 MG tablet Take 1 tablet (10 mg total) by mouth daily. 10/21/22   Kara Dies, NP  Blood Glucose Monitoring Suppl (ACCU-CHEK GUIDE ME) w/Device KIT 1 Device by Does not apply route 4 (four) times daily as needed. 06/10/22   Hannah Downs, MD  Calcium Carbonate (CALCIUM 600 PO) Take by mouth daily.    [provider]  Cholecalciferol (VITAMIN D3) 50 MCG (2000 UT) capsule Take 2,000 Units by mouth daily.    [provider]  hydrochlorothiazide (HYDRODIURIL) 25 MG tablet Take 1 tablet (25 mg total) by mouth daily. 09/11/22   Kara Dies, NP  metFORMIN (GLUCOPHAGE) 500 MG tablet Take 4 tablets by mouth  every day. 10/21/22   Kara Dies, NP  Multiple Vitamin (MULTIVITAMIN WITH MINERALS) TABS tablet Take 1 tablet by mouth daily.    [provider]  mupirocin ointment (BACTROBAN) 2 % Apply 1 Application topically 2 (two) times daily. 09/11/22   Kara Dies, NP  Polyethyl Glycol-Propyl Glycol (SYSTANE) 0.4-0.3 % SOLN Apply to eye. Patient not taking: Reported on 04/28/2023    [provider]  potassium chloride SA  (KLOR-CON) 20 MEQ tablet Take 1 tablet (20 mEq total) by mouth 2 (two) times daily. 10/02/20   Hannah Downs, MD  vitamin B-12 (CYANOCOBALAMIN) 1000 MCG tablet Take 1,000 mcg by mouth daily.    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  Tobacco Use   Smoking status: Never   Smokeless tobacco: Never  Vaping Use   Vaping status: Never Used  Substance and Sexual Activity   Alcohol use: No   Drug use: No   Sexual activity: Not Currently  Other Topics Concern   Not on file  Social History Narrative   Lives at home with daughter   Social Determinants of Health   Financial Resource Strain: Low Risk  (04/05/2021)   Overall Financial Resource Strain (CARDIA)    Difficulty of Paying Living Expenses: Not very hard  Food Insecurity: No Food Insecurity (04/05/2021)   Hunger Vital Sign    Worried About Running Out of Food in the Last Year: Never true    Ran Out of Food in the Last Year: Never true  Transportation Needs: No Transportation Needs (04/05/2021)   PRAPARE - Administrator, Civil Service (Medical): No    Lack of Transportation (Non-Medical): No  Physical Activity: Insufficiently Active (04/05/2021)   Exercise Vital Sign    Days of Exercise per Week: 4 days    Minutes of Exercise per Session: 20 min  Stress: No Stress Concern Present (04/05/2021)   [employer] of Occupational Health - Occupational Stress Questionnaire    Feeling of Stress : Not at all   Social Connections: Moderately Integrated (04/05/2021)   Social Connection and Isolation Panel [NHANES]    Frequency of Communication with Friends and Family: More than three times a week    Frequency of Social Gatherings with Friends and Family: More than three times a week    Attends Religious Services: More than 4 times per year    Active Member of [employer] or Organizations: Yes    Attends Banker Meetings: More than 4 times per year    Marital Status: Widowed  Intimate Partner Violence: Not At Risk (04/05/2021)   Humiliation, Afraid, Rape, and Kick questionnaire    Fear of Current or Ex-Partner: No    Emotionally Abused: No    Physically Abused: No    Sexually Abused: No    Review of Systems: See HPI, otherwise negative ROS  Physical Exam:  BP (!) 171/79   Pulse (!) 106   Temp (!) 97.5 F (36.4 C) (Temporal)   Resp 20   Ht 5\' 3"  (1.6 m)   Wt 56.4 kg   SpO2 100%   BMI 22.04 kg/m  General:   Alert,  pleasant and cooperative in NAD Head:  Normocephalic and atraumatic. Neck:  Supple; no masses or thyromegaly. Lungs:  Clear throughout to auscultation, normal respiratory effort.    Heart:  +S1, +S2, Regular rate and rhythm, No edema. Abdomen:  Soft, nontender and nondistended. Normal bowel sounds, without guarding, and without rebound.   Neurologic:  Alert and  oriented x4;  grossly normal neurologically.  Impression/Plan: Kathreen Devoid is here for an colonoscopy to be performed for surveillance due to prior history of colon polyps   Risks, benefits, limitations, and alternatives regarding  colonoscopy have been reviewed with the patient.  Questions have been answered.  All parties agreeable.   Hannah Mood, MD  04/28/2023, 9:35 AM

## 2023-05-04 NOTE — Anesthesia Postprocedure Evaluation (Signed)
Anesthesia Post Note  Patient: [name]  Procedure(s) Performed: COLONOSCOPY WITH PROPOFOL  Patient location during evaluation: Endoscopy Anesthesia Type: General Level of consciousness: awake and alert Pain management: pain level controlled Vital Signs Assessment: post-procedure vital signs reviewed and stable Respiratory status: spontaneous breathing, nonlabored ventilation, respiratory function stable and patient connected to nasal cannula oxygen Cardiovascular status: blood pressure returned to baseline and stable Postop Assessment: no apparent nausea or vomiting Anesthetic complications: no   No notable events documented.   Last Vitals:  Vitals:   04/28/23 1042 04/28/23 1052  BP: 121/64 110/71  Pulse: 77 79  Resp: 14 14  Temp: (!) 36.2 C (!) 36.2 C  SpO2:      Last Pain:  Vitals:   04/29/23 0740  TempSrc:   PainSc: 0-No pain                 Lenard Simmer

## 2023-05-11 DIAGNOSIS — E113211 Type 2 diabetes mellitus with mild nonproliferative diabetic retinopathy with macular edema, right eye: Secondary | ICD-10-CM | POA: Diagnosis not present

## 2023-05-29 ENCOUNTER — Other Ambulatory Visit: Payer: Self-pay | Admitting: Nurse Practitioner

## 2023-06-04 ENCOUNTER — Ambulatory Visit (INDEPENDENT_AMBULATORY_CARE_PROVIDER_SITE_OTHER): Payer: Medicare PPO | Admitting: Nurse Practitioner

## 2023-06-04 ENCOUNTER — Encounter: Payer: Self-pay | Admitting: Nurse Practitioner

## 2023-06-04 VITALS — BP 120/74 | HR 96 | Temp 97.9°F | Ht 63.0 in | Wt 130.2 lb

## 2023-06-04 DIAGNOSIS — M1990 Unspecified osteoarthritis, unspecified site: Secondary | ICD-10-CM

## 2023-06-04 DIAGNOSIS — E1143 Type 2 diabetes mellitus with diabetic autonomic (poly)neuropathy: Secondary | ICD-10-CM | POA: Diagnosis not present

## 2023-06-04 DIAGNOSIS — I10 Essential (primary) hypertension: Secondary | ICD-10-CM

## 2023-06-04 DIAGNOSIS — E785 Hyperlipidemia, unspecified: Secondary | ICD-10-CM

## 2023-06-04 DIAGNOSIS — Z7984 Long term (current) use of oral hypoglycemic drugs: Secondary | ICD-10-CM

## 2023-06-04 DIAGNOSIS — Z9189 Other specified personal risk factors, not elsewhere classified: Secondary | ICD-10-CM

## 2023-06-04 MED ORDER — LANCETS MISC: 2 refills | Status: AC

## 2023-06-04 NOTE — Patient Instructions (Addendum)
 Please go to the lab for blood work

## 2023-06-04 NOTE — Progress Notes (Signed)
 Established Patient Office Visit  Subjective:  Patient ID: [name], female    DOB: Feb 07, 1946  Age: 78 y.o. MRN: [id number]  CC:  Chief Complaint  Patient presents with   Medical Management of Chronic Issues    HPI  [name] with a history of hypertension, diabetes, and rheumatoid arthritis, presents for a routine follow-up. They report feeling pretty good with no new health concerns. They have been adherent to their medication regimen.  The patient has been managing a skin condition on their foot, which they describe as a scale that has hardened. They have been applying a topical treatment to soften the area. They report that the color of the skin is returning to normal.  The patient's blood sugars, checked every morning, are consistently in the nineties. They report no numbness or tingling sensations. They have been experiencing some pain in their hands, which they attribute to their long-standing rheumatoid arthritis. They manage this pain with Tylenol  as needed.  The patient also mentions a recent colonoscopy, which revealed diverticulosis but no inflammation. They have been taking iron supplements for slightly low hemoglobin levels. They also report taking care of their daughter, who has cerebral palsy, as their primary daily activity.  HPI   Past Medical History:  Diagnosis Date   Anemia    Arthritis    hands and knees   Colon polyp 01/30/2016   Tubulovillous adenoma   Diabetes mellitus without complication (HCC) 2016   Diabetic eye exam (HCC) 09/19/2019   Performed at [name] Background diabetic retinopathy & diabetic macular edema, follow up one with Dr. Laurita   Hypertension    Wears dentures    full upper and lower    Past Surgical History:  Procedure Laterality Date   ANTERIOR APPROACH HEMI HIP ARTHROPLASTY Right 07/04/2018   [facility]   CATARACT EXTRACTION W/PHACO Right 08/05/2016   Procedure: CATARACT EXTRACTION PHACO AND INTRAOCULAR  LENS PLACEMENT (IOC);  Surgeon: Hannah Carmine, MD;  Location: [facility];  Service: Ophthalmology;  Laterality: Right;  US  39.5 AP% 21.4 CDE 8.45 Fluid pack lot # [id number]   CATARACT EXTRACTION W/PHACO Left 02/01/2019   Procedure: CATARACT EXTRACTION PHACO AND INTRAOCULAR LENS PLACEMENT (IOC) LEFT DIABETIC  2:00.2  15.0%  18.58;  Surgeon: [facility] Elsie, MD;  Location: [facility];  Service: Ophthalmology;  Laterality: Left;  Diabeetic - oral meds   COLONOSCOPY WITH PROPOFOL  N/A 01/09/2016   Procedure: COLONOSCOPY WITH PROPOFOL ;  Surgeon: Hannah RAYMOND Mariner, MD;  Location: [facility];  Service: Endoscopy;  Laterality: N/A;   COLONOSCOPY WITH PROPOFOL  N/A 01/28/2017   Procedure: COLONOSCOPY WITH PROPOFOL ;  Surgeon: Hannah Louanne MATSU, MD;  Location: [facility];  Service: Endoscopy;  Laterality: N/A;   COLONOSCOPY WITH PROPOFOL  N/A 12/30/2019   Procedure: COLONOSCOPY WITH PROPOFOL ;  Surgeon: Hannah Keene NOVAK, MD;  Location: [facility] ENDOS[facility];  Service: Endoscopy;  Laterality: N/A;   COLONOSCOPY WITH PROPOFOL  N/A 04/28/2023   Procedure: COLONOSCOPY WITH PROPOFOL ;  Surgeon: Hannah Bi, MD;  Location: [facility];  Service: Gastroenterology;  Laterality: N/A;   LAPAROSCOPIC SIGMOID COLECTOMY N/A 01/30/2016   Tubulovillous adenoma    Family History  Problem Relation Age of Onset   Breast cancer Sister        70's   Colon cancer Sister     Social History   Socioeconomic History   Marital status: Widowed    Spouse name: Not on file   Number of children: 3   Years of education: Not on  file   Highest education level: 12th grade  Occupational History   Occupation: Retired    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  Tobacco Use   Smoking status: Never   Smokeless tobacco: Never  Vaping Use   Vaping status: Never Used  Substance and Sexual Activity   Alcohol use: No   Drug use: No   Sexual activity: Not Currently  Other Topics Concern   Not on file   Social History Narrative   Lives at home with daughter   Social Drivers of Health   Financial Resource Strain: Low Risk  (04/05/2021)   Overall Financial Resource Strain (CARDIA)    Difficulty of Paying Living Expenses: Not very hard  Food Insecurity: No Food Insecurity (04/05/2021)   Hunger Vital Sign    Worried About Running Out of Food in the Last Year: Never true    Ran Out of Food in the Last Year: Never true  Transportation Needs: No Transportation Needs (04/05/2021)   PRAPARE - Administrator, Civil Service (Medical): No    Lack of Transportation (Non-Medical): No  Physical Activity: Insufficiently Active (04/05/2021)   Exercise Vital Sign    Days of Exercise per Week: 4 days    Minutes of Exercise per Session: 20 min  Stress: No Stress Concern Present (04/05/2021)   [employer] of Occupational Health - Occupational Stress Questionnaire    Feeling of Stress : Not at all  Social Connections: Moderately Integrated (04/05/2021)   Social Connection and Isolation Panel [NHANES]    Frequency of Communication with Friends and Family: More than three times a week    Frequency of Social Gatherings with Friends and Family: More than three times a week    Attends Religious Services: More than 4 times per year    Active Member of [employer] or Organizations: Yes    Attends Banker Meetings: More than 4 times per year    Marital Status: Widowed  Intimate Partner Violence: Not At Risk (04/05/2021)   Humiliation, Afraid, Rape, and Kick questionnaire    Fear of Current or Ex-Partner: No    Emotionally Abused: No    Physically Abused: No    Sexually Abused: No     Outpatient Medications Prior to Visit  Medication Sig Dispense Refill   ACCU-CHEK AVIVA PLUS test strip TEST BLOOD SUGAR EVERY DAY 100 strip 10   Accu-Chek Softclix Lancets lancets CHECK BLOOD SUGAR EVERY DAY 100 each 6   acetaminophen  (TYLENOL ) 500 MG tablet Take 500 mg by mouth every 6 (six) hours as  needed for moderate pain or headache.     alendronate  (FOSAMAX ) 70 MG tablet TAKE 1 TABLET (70 MG TOTAL) BY MOUTH ONCE A WEEK. TAKE WITH A FULL GLASS OF WATER ON AN EMPTY STOMACH. 12 tablet 3   amLODipine  (NORVASC ) 5 MG tablet TAKE 1 TABLET EVERY DAY 90 tablet 3   atorvastatin  (LIPITOR) 10 MG tablet Take 1 tablet (10 mg total) by mouth daily. 90 tablet 3   Blood Glucose Monitoring Suppl (ACCU-CHEK GUIDE ME) w/Device KIT 1 Device by Does not apply route 4 (four) times daily as needed. 1 kit 0   Calcium  Carbonate (CALCIUM  600 PO) Take by mouth daily.     Cholecalciferol (VITAMIN D3) 50 MCG (2000 UT) capsule Take 2,000 Units by mouth daily.     hydrochlorothiazide  (HYDRODIURIL ) 25 MG tablet TAKE 1 TABLET EVERY DAY 90 tablet 3   metFORMIN  (GLUCOPHAGE ) 500 MG tablet Take 4 tablets  by mouth every day. 360 tablet 3   Multiple Vitamin (MULTIVITAMIN WITH MINERALS) TABS tablet Take 1 tablet by mouth daily.     mupirocin  ointment (BACTROBAN ) 2 % Apply 1 Application topically 2 (two) times daily. 22 g 0   Polyethyl Glycol-Propyl Glycol (SYSTANE) 0.4-0.3 % SOLN Apply to eye.     potassium chloride  SA (KLOR-CON ) 20 MEQ tablet Take 1 tablet (20 mEq total) by mouth 2 (two) times daily. 30 tablet 4   vitamin B-12 (CYANOCOBALAMIN) 1000 MCG tablet Take 1,000 mcg by mouth daily.     No facility-administered medications prior to visit.    Allergies  Allergen Reactions   Oxycodone  Nausea And Vomiting    ROS Review of Systems Negative unless indicated in HPI.    Objective:    Physical Exam Constitutional:      Appearance: Normal appearance.  HENT:     Mouth/Throat:     Mouth: Mucous membranes are moist.  Eyes:     Conjunctiva/sclera: Conjunctivae normal.     Pupils: Pupils are equal, round, and reactive to light.  Cardiovascular:     Rate and Rhythm: Normal rate and regular rhythm.     Pulses: Normal pulses.     Heart sounds: Normal heart sounds.  Pulmonary:     Effort: Pulmonary effort is  normal.     Breath sounds: Normal breath sounds.  Abdominal:     General: Bowel sounds are normal.     Palpations: Abdomen is soft.  Musculoskeletal:     Cervical back: Normal range of motion. No tenderness.  Skin:    General: Skin is warm.     Findings: No bruising.  Neurological:     General: No focal deficit present.     Mental Status: She is alert and oriented to person, place, and time. Mental status is at baseline.  Psychiatric:        Mood and Affect: Mood normal.        Behavior: Behavior normal.        Thought Content: Thought content normal.        Judgment: Judgment normal.     BP 120/74   Pulse 96   Temp 97.9 F (36.6 C)   Ht 5' 3 (1.6 m)   Wt 130 lb 3.2 oz (59.1 kg)   SpO2 99%   BMI 23.06 kg/m  Wt Readings from Last 3 Encounters:  06/04/23 130 lb 3.2 oz (59.1 kg)  04/28/23 124 lb 6.4 oz (56.4 kg)  09/11/22 126 lb 9.6 oz (57.4 kg)     Health Maintenance  Topic Date Due   FOOT EXAM  Never done   Hepatitis C Screening  Never done   DTaP/Tdap/Td (1 - Tdap) Never done   Zoster Vaccines- Shingrix (1 of 2) Never done   DEXA SCAN  Never done   Pneumonia Vaccine 17+ Years old (2 of 2 - PCV) 01/31/2017   Medicare Annual Wellness (AWV)  04/05/2022   COVID-19 Vaccine (4 - 2024-25 season) 06/20/2023 (Originally 02/01/2023)   INFLUENZA VACCINE  08/31/2023 (Originally 01/01/2023)   HEMOGLOBIN A1C  12/02/2023   OPHTHALMOLOGY EXAM  01/12/2024   Diabetic kidney evaluation - eGFR measurement  06/03/2024   Diabetic kidney evaluation - Urine ACR  06/03/2024   Colonoscopy  04/27/2026   HPV VACCINES  Aged Out    There are no preventive care reminders to display for this patient.  Lab Results  Component Value Date   TSH 0.64 06/04/2023   Lab Results  Component Value Date   WBC 7.9 06/04/2023   HGB 11.6 (L) 06/04/2023   HCT 34.2 (L) 06/04/2023   MCV 82.0 06/04/2023   PLT 210.0 06/04/2023   Lab Results  Component Value Date   NA 134 (L) 06/04/2023   K 3.7  06/04/2023   CO2 24 06/04/2023   GLUCOSE 95 06/04/2023   BUN 22 06/04/2023   CREATININE 1.27 (H) 06/04/2023   BILITOT 0.5 06/04/2023   ALKPHOS 71 06/04/2023   AST 35 06/04/2023   ALT 37 (H) 06/04/2023   PROT 9.0 (H) 06/04/2023   ALBUMIN 4.1 06/04/2023   CALCIUM  10.7 (H) 06/04/2023   ANIONGAP 7 01/31/2016   GFR 41.27 (L) 06/04/2023   Lab Results  Component Value Date   CHOL 129 06/04/2023   Lab Results  Component Value Date   HDL 54.10 06/04/2023   Lab Results  Component Value Date   LDLCALC 62 06/04/2023   Lab Results  Component Value Date   TRIG 61.0 06/04/2023   Lab Results  Component Value Date   CHOLHDL 2 06/04/2023   Lab Results  Component Value Date   HGBA1C 6.2 06/04/2023      Assessment & Plan:  Type 2 diabetes mellitus with diabetic autonomic neuropathy, without long-term current use of insulin (HCC) Assessment & Plan: Fasting blood sugars in the 90s to low 100s. -Continue Metformin  500mg , 2 tablets twice daily. -Refer to podiatry for foot examination and nail care. -Advise patient to allow feet to air out to prevent fungal growth.  Orders: -     Hemoglobin A1c -     Comprehensive metabolic panel -     Lipid panel -     TSH -     Microalbumin / creatinine urine ratio -     Ambulatory referral to Podiatry  Benign essential hypertension Assessment & Plan: Well controlled on Amlodipine  10mg  daily. -Continue current regimen.  Orders: -     CBC with Differential/Platelet -     Comprehensive metabolic panel -     Lipid panel -     TSH  At risk for loss of bone density -     HM DEXA SCAN; Future  Hyperlipidemia, unspecified hyperlipidemia type Assessment & Plan: Managed on Atorvastatin  10mg  daily. -Continue current regimen.   Arthritis Assessment & Plan: Continue Fosamax  and vitamin D and calcium  supplement.   Other orders -     Lancets; Accu-Check Guide Me Lancets  Dispense: 100 each; Refill: 2    Follow-up: Return in about 6  months (around 12/02/2023).   Avanish Cerullo, NP

## 2023-06-05 ENCOUNTER — Telehealth: Payer: Self-pay

## 2023-06-05 LAB — LIPID PANEL
Cholesterol: 129 mg/dL (ref 0–200)
HDL: 54.1 mg/dL (ref >39.00)
LDL Cholesterol: 62 mg/dL (ref 0–99)
NonHDL: 74.62
Total CHOL/HDL Ratio: 2
Triglycerides: 61 mg/dL (ref 0.0–149.0)
VLDL: 12.2 mg/dL (ref 0.0–40.0)

## 2023-06-05 LAB — CBC WITH DIFFERENTIAL/PLATELET
Basophils Absolute: 0.1 10*3/uL (ref 0.0–0.1)
Basophils Relative: 1.1 % (ref 0.0–3.0)
Eosinophils Absolute: 0.1 10*3/uL (ref 0.0–0.7)
Eosinophils Relative: 0.9 % (ref 0.0–5.0)
HCT: 34.2 % — ABNORMAL LOW (ref 36.0–46.0)
Hemoglobin: 11.6 g/dL — ABNORMAL LOW (ref 12.0–15.0)
Lymphocytes Relative: 36.7 % (ref 12.0–46.0)
Lymphs Abs: 2.9 10*3/uL (ref 0.7–4.0)
MCHC: 33.9 g/dL (ref 30.0–36.0)
MCV: 82 fL (ref 78.0–100.0)
Monocytes Absolute: 0.5 10*3/uL (ref 0.1–1.0)
Monocytes Relative: 5.9 % (ref 3.0–12.0)
Neutro Abs: 4.4 10*3/uL (ref 1.4–7.7)
Neutrophils Relative %: 55.4 % (ref 43.0–77.0)
Platelets: 210 10*3/uL (ref 150.0–400.0)
RBC: 4.17 Mil/uL (ref 3.87–5.11)
RDW: 12.8 % (ref 11.5–15.5)
WBC: 7.9 10*3/uL (ref 4.0–10.5)

## 2023-06-05 LAB — COMPREHENSIVE METABOLIC PANEL
ALT: 37 U/L — ABNORMAL HIGH (ref 0–35)
AST: 35 U/L (ref 0–37)
Albumin: 4.1 g/dL (ref 3.5–5.2)
Alkaline Phosphatase: 71 U/L (ref 39–117)
BUN: 22 mg/dL (ref 6–23)
CO2: 24 meq/L (ref 19–32)
Calcium: 10.7 mg/dL — ABNORMAL HIGH (ref 8.4–10.5)
Chloride: 96 meq/L (ref 96–112)
Creatinine, Ser: 1.27 mg/dL — ABNORMAL HIGH (ref 0.40–1.20)
GFR: 41.27 mL/min — ABNORMAL LOW (ref >60.00)
Glucose, Bld: 95 mg/dL (ref 70–99)
Potassium: 3.7 meq/L (ref 3.5–5.1)
Sodium: 134 meq/L — ABNORMAL LOW (ref 135–145)
Total Bilirubin: 0.5 mg/dL (ref 0.2–1.2)
Total Protein: 9 g/dL — ABNORMAL HIGH (ref 6.0–8.3)

## 2023-06-05 LAB — TSH: TSH: 0.64 u[IU]/mL (ref 0.35–5.50)

## 2023-06-05 LAB — HEMOGLOBIN A1C: Hgb A1c MFr Bld: 6.2 % (ref 4.6–6.5)

## 2023-06-05 LAB — MICROALBUMIN / CREATININE URINE RATIO
Creatinine,U: 61.1 mg/dL
Microalb Creat Ratio: 13.3 mg/g (ref 0.0–30.0)
Microalb, Ur: 8.1 mg/dL — ABNORMAL HIGH (ref 0.0–1.9)

## 2023-06-05 NOTE — Telephone Encounter (Signed)
 Copied from CRM [id number]. Topic: Clinical - Medical Advice >> Jun 05, 2023  9:12 AM Fredrich Romans wrote: Reason for CRM: Patient was advised to call back and give Dr Evelene Croon the strength of her amlodipine after she got home.She called reporting that it is 5mg .

## 2023-06-08 ENCOUNTER — Encounter: Payer: Self-pay | Admitting: Nurse Practitioner

## 2023-06-08 DIAGNOSIS — M069 Rheumatoid arthritis, unspecified: Secondary | ICD-10-CM | POA: Insufficient documentation

## 2023-06-08 DIAGNOSIS — E785 Hyperlipidemia, unspecified: Secondary | ICD-10-CM | POA: Insufficient documentation

## 2023-06-08 NOTE — Assessment & Plan Note (Deleted)
 Chronic condition with some hand pain. -Continue over-the-counter Tylenol as needed for pain.

## 2023-06-08 NOTE — Assessment & Plan Note (Signed)
Continue Fosamax and vitamin D and calcium supplement.

## 2023-06-08 NOTE — Assessment & Plan Note (Signed)
 Well controlled on Amlodipine 10mg  daily. -Continue current regimen.

## 2023-06-08 NOTE — Assessment & Plan Note (Signed)
 Managed on Atorvastatin 10mg  daily. -Continue current regimen.

## 2023-06-08 NOTE — Assessment & Plan Note (Addendum)
 Fasting blood sugars in the 90s to low 100s. -Continue Metformin 500mg , 2 tablets twice daily. -Refer to podiatry for foot examination and nail care. -Advise patient to allow feet to air out to prevent fungal growth.

## 2023-06-09 ENCOUNTER — Telehealth: Payer: Self-pay

## 2023-06-09 ENCOUNTER — Other Ambulatory Visit: Payer: Self-pay

## 2023-06-09 MED ORDER — ACCU-CHEK SOFTCLIX LANCETS MISC: 6 refills | Status: AC

## 2023-06-09 NOTE — Telephone Encounter (Signed)
 Accu check softclix lancet and directions sent to [facility].

## 2023-06-09 NOTE — Telephone Encounter (Signed)
 Copied from CRM [id number]. Topic: Clinical - Prescription Issue >> Jun 09, 2023 10:06 AM Corean SAUNDERS wrote: Reason for CRM:  Patient is requesting Chelsea Aurora to call [facility] - Phone: [phone] Fax: [phone] and give directions for Accu-Chek Softclix Lancets lancets. Patient states that the pharmacy will not send this out until this is complete.

## 2023-07-06 ENCOUNTER — Ambulatory Visit (INDEPENDENT_AMBULATORY_CARE_PROVIDER_SITE_OTHER): Payer: Medicare HMO | Admitting: *Deleted

## 2023-07-06 VITALS — Ht 63.0 in | Wt 130.0 lb

## 2023-07-06 DIAGNOSIS — Z0000 Encounter for general adult medical examination without abnormal findings: Secondary | ICD-10-CM | POA: Diagnosis not present

## 2023-07-06 NOTE — Progress Notes (Signed)
Subjective:   [name] is a 78 y.o. female who presents for Medicare Annual (Subsequent) preventive examination.  Visit Complete: Virtual I connected with  Kathreen Devoid on 07/06/23 by a audio enabled telemedicine application and verified that I am speaking with the correct person using two identifiers. This patient declined Interactive audio and Acupuncturist. Therefore the visit was completed with audio only.   Patient Location: Home  Provider Location: Office/Clinic  I discussed the limitations of evaluation and management by telemedicine. The patient expressed understanding and agreed to proceed.  Vital Signs: Because this visit was a virtual/telehealth visit, some criteria may be missing or patient reported. Any vitals not documented were not able to be obtained and vitals that have been documented are patient reported.   Cardiac Risk Factors include: advanced age (>44men, >66 women);diabetes mellitus;dyslipidemia;hypertension     Objective:    Today's Vitals   07/06/23 0839  Weight: 130 lb (59 kg)  Height: 5\' 3"  (1.6 m)   Body mass index is 23.03 kg/m.     07/06/2023    8:49 AM 04/28/2023    9:23 AM 04/05/2021    2:18 PM 12/30/2019    8:07 AM 02/01/2019    9:53 AM 07/13/2018    3:15 PM 01/28/2017    9:00 AM  Advanced Directives  Does Patient Have a Medical Advance Directive? No No No No No No No  Would patient like information on creating a medical advance directive? No - Patient declined No - Patient declined  No - Patient declined No - Patient declined No - Patient declined     Current Medications (verified) Outpatient Encounter Medications as of 07/06/2023  Medication Sig   ACCU-CHEK AVIVA PLUS test strip TEST BLOOD SUGAR EVERY DAY   Accu-Chek Softclix Lancets lancets CHECK BLOOD SUGAR EVERY DAY   acetaminophen (TYLENOL) 500 MG tablet Take 500 mg by mouth every 6 (six) hours as needed for moderate pain or headache.   alendronate (FOSAMAX) 70 MG  tablet TAKE 1 TABLET (70 MG TOTAL) BY MOUTH ONCE A WEEK. TAKE WITH A FULL GLASS OF WATER ON AN EMPTY STOMACH.   amLODipine (NORVASC) 5 MG tablet TAKE 1 TABLET EVERY DAY   atorvastatin (LIPITOR) 10 MG tablet Take 1 tablet (10 mg total) by mouth daily.   Blood Glucose Monitoring Suppl (ACCU-CHEK GUIDE ME) w/Device KIT 1 Device by Does not apply route 4 (four) times daily as needed.   Calcium Carbonate (CALCIUM 600 PO) Take by mouth daily.   Cholecalciferol (VITAMIN D3) 50 MCG (2000 UT) capsule Take 2,000 Units by mouth daily.   hydrochlorothiazide (HYDRODIURIL) 25 MG tablet TAKE 1 TABLET EVERY DAY   Lancets MISC Accu-Check Guide Me Lancets   metFORMIN (GLUCOPHAGE) 500 MG tablet Take 4 tablets by mouth every day.   Multiple Vitamin (MULTIVITAMIN WITH MINERALS) TABS tablet Take 1 tablet by mouth daily.   Polyethyl Glycol-Propyl Glycol (SYSTANE) 0.4-0.3 % SOLN Apply to eye.   potassium chloride SA (KLOR-CON) 20 MEQ tablet Take 1 tablet (20 mEq total) by mouth 2 (two) times daily.   vitamin B-12 (CYANOCOBALAMIN) 1000 MCG tablet Take 1,000 mcg by mouth daily.   [DISCONTINUED] mupirocin ointment (BACTROBAN) 2 % Apply 1 Application topically 2 (two) times daily. (Patient not taking: Reported on 07/06/2023)   No facility-administered encounter medications on file as of 07/06/2023.    Allergies (verified) Oxycodone   History: Past Medical History:  Diagnosis Date   Anemia    Arthritis  hands and knees   Colon polyp 01/30/2016   Tubulovillous adenoma   Diabetes mellitus without complication (HCC) 2016   Diabetic eye exam (HCC) 09/19/2019   Performed at [facility].. Background diabetic retinopathy & diabetic macular edema, follow up one with Dr. Chipper Herb   Hypertension    Wears dentures    full upper and lower   Past Surgical History:  Procedure Laterality Date   ANTERIOR APPROACH HEMI HIP ARTHROPLASTY Right 07/04/2018   [facility]   CATARACT EXTRACTION W/PHACO Right 08/05/2016   Procedure:  CATARACT EXTRACTION PHACO AND INTRAOCULAR LENS PLACEMENT (IOC);  Surgeon: Galen Manila, MD;  Location: [facility] ORS;  Service: Ophthalmology;  Laterality: Right;  [place] 39.5 AP% 21.4 CDE 8.45 Fluid pack lot # [id number]   CATARACT EXTRACTION W/PHACO Left 02/01/2019   Procedure: CATARACT EXTRACTION PHACO AND INTRAOCULAR LENS PLACEMENT (IOC) LEFT DIABETIC  2:00.2  15.0%  18.58;  Surgeon: Galen Manila, MD;  Location: [facility];  Service: Ophthalmology;  Laterality: Left;  Diabeetic - oral meds   COLONOSCOPY WITH PROPOFOL N/A 01/09/2016   Procedure: COLONOSCOPY WITH PROPOFOL;  Surgeon: Christena Deem, MD;  Location: [facility];  Service: Endoscopy;  Laterality: N/A;   COLONOSCOPY WITH PROPOFOL N/A 01/28/2017   Procedure: COLONOSCOPY WITH PROPOFOL;  Surgeon: Kieth Brightly, MD;  Location: [facility] ENDOSCOPY;  Service: Endoscopy;  Laterality: N/A;   COLONOSCOPY WITH PROPOFOL N/A 12/30/2019   Procedure: COLONOSCOPY WITH PROPOFOL;  Surgeon: Pasty Spillers, MD;  Location: [facility] ENDOSCOPY;  Service: Endoscopy;  Laterality: N/A;   COLONOSCOPY WITH PROPOFOL N/A 04/28/2023   Procedure: COLONOSCOPY WITH PROPOFOL;  Surgeon: Wyline Mood, MD;  Location: [facility] ENDOSCOPY;  Service: Gastroenterology;  Laterality: N/A;   LAPAROSCOPIC SIGMOID COLECTOMY N/A 01/30/2016   Tubulovillous adenoma   Family History  Problem Relation Age of Onset   Breast cancer Sister        24's   Colon cancer Sister    Social History   Socioeconomic History   Marital status: Widowed    Spouse name: Not on file   Number of children: 3   Years of education: Not on file   Highest education level: 12th grade  Occupational History   Occupation: Retired    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  Tobacco Use   Smoking status: Never   Smokeless tobacco: Never  Vaping Use   Vaping status: Never Used  Substance and Sexual Activity   Alcohol use: No   Drug use: No   Sexual activity: Not Currently   Other Topics Concern   Not on file  Social History Narrative   Lives at home with daughter   Social Drivers of Health   Financial Resource Strain: Low Risk  (07/06/2023)   Overall Financial Resource Strain (CARDIA)    Difficulty of Paying Living Expenses: Not hard at all  Food Insecurity: No Food Insecurity (07/06/2023)   Hunger Vital Sign    Worried About Running Out of Food in the Last Year: Never true    Ran Out of Food in the Last Year: Never true  Transportation Needs: No Transportation Needs (07/06/2023)   PRAPARE - Administrator, Civil Service (Medical): No    Lack of Transportation (Non-Medical): No  Physical Activity: Inactive (07/06/2023)   Exercise Vital Sign    Days of Exercise per Week: 0 days    Minutes of Exercise per Session: 0 min  Stress: No Stress Concern Present (07/06/2023)   [employer] of Occupational  Health - Occupational Stress Questionnaire    Feeling of Stress : Not at all  Social Connections: Moderately Integrated (07/06/2023)   Social Connection and Isolation Panel [NHANES]    Frequency of Communication with Friends and Family: More than three times a week    Frequency of Social Gatherings with Friends and Family: More than three times a week    Attends Religious Services: More than 4 times per year    Active Member of [employer] or Organizations: Yes    Attends Banker Meetings: More than 4 times per year    Marital Status: Widowed    Tobacco Counseling Counseling given: Not Answered   Clinical Intake:  Pre-visit preparation completed: Yes  Pain : No/denies pain     BMI - recorded: 23.03 Nutritional Status: BMI of 19-24  Normal Nutritional Risks: None Diabetes: Yes CBG done?: Yes (FBS 94 per patient) CBG resulted in Enter/ Edit results?: No Did pt. bring in CBG monitor from home?: No  How often do you need to have someone help you when you read instructions, pamphlets, or other written materials from your doctor or  pharmacy?: 1 - Never  Interpreter Needed?: No  Information entered by :: R. Daouda Lonzo LPN   Activities of Daily Living    07/06/2023    8:41 AM  In your present state of health, do you have any difficulty performing the following activities:  Hearing? 0  Vision? 0  Difficulty concentrating or making decisions? 0  Walking or climbing stairs? 0  Dressing or bathing? 0  Doing errands, shopping? 0  Preparing Food and eating ? N  Using the Toilet? N  In the past six months, have you accidently leaked urine? N  Do you have problems with loss of bowel control? N  Managing your Medications? N  Managing your Finances? N  Housekeeping or managing your Housekeeping? N    Patient Care Team: Kara Dies, NP as PCP - General (Nurse Practitioner) Daryll Drown, FNP as Nurse Practitioner (Nurse Practitioner) Kieth Brightly, MD (General Surgery)  Indicate any recent Medical Services you may have received from other than [facility] providers in the past year (date may be approximate).     Assessment:   This is a routine wellness examination for Marka.  Hearing/Vision screen Hearing Screening - Comments:: No issues Vision Screening - Comments:: glasses   Goals Addressed             This Visit's Progress    Patient Stated       Wants to go out more and connect more with people       Depression Screen    07/06/2023    8:45 AM 06/04/2023    1:55 PM 09/11/2022   10:09 AM 04/05/2021    2:14 PM  PHQ 2/9 Scores  PHQ - 2 Score 0 0 0 0  PHQ- 9 Score 0 0 0     Fall Risk    07/06/2023    8:43 AM 06/04/2023    1:55 PM 09/11/2022   10:09 AM 04/05/2021    2:19 PM 11/28/2019    1:20 PM  Fall Risk   Falls in the past year? 0 0 0 0 1  Number falls in past yr: 0 0 0 0 0  Injury with Fall? 0 0 0 0 1  Risk for fall due to : No Fall Risks No Fall Risks No Fall Risks No Fall Risks   Follow up Falls prevention discussed;Falls evaluation completed Falls  evaluation completed Falls evaluation  completed Falls evaluation completed     MEDICARE RISK AT HOME: Medicare Risk at Home Any stairs in or around the home?: Yes If so, are there any without handrails?: No Home free of loose throw rugs in walkways, pet beds, electrical cords, etc?: Yes Adequate lighting in your home to reduce risk of falls?: Yes Life alert?: No Use of a cane, walker or w/c?: No Grab bars in the bathroom?: No Shower chair or bench in shower?: Yes Elevated toilet seat or a handicapped toilet?: No  Cognitive Function:        07/06/2023    8:49 AM 04/05/2021    2:21 PM  6CIT Screen  What Year? 0 points 0 points  What month? 0 points 0 points  What time? 0 points 0 points  Count back from 20 0 points 0 points  Months in reverse 0 points 0 points  Repeat phrase 0 points 0 points  Total Score 0 points 0 points    Immunizations Immunization History  Administered Date(s) Administered   Influenza-Unspecified 03/02/2018   Moderna Sars-Covid-2 Vaccination 07/27/2019, 08/24/2019, 04/10/2020   Pneumococcal Polysaccharide-23 02/01/2016    TDAP status: Due, Education has been provided regarding the importance of this vaccine. Advised may receive this vaccine at local pharmacy or Health Dept. Aware to provide a copy of the vaccination record if obtained from local pharmacy or Health Dept. Verbalized acceptance and understanding.  Flu Vaccine status: Up to date  Pneumococcal vaccine status: Due, Education has been provided regarding the importance of this vaccine. Advised may receive this vaccine at local pharmacy or Health Dept. Aware to provide a copy of the vaccination record if obtained from local pharmacy or Health Dept. Verbalized acceptance and understanding.  Covid-19 vaccine status: Completed vaccines  Qualifies for Shingles Vaccine? Yes   Zostavax completed No   Shingrix Completed?: No.    Education has been provided regarding the importance of this vaccine. Patient has been advised to call  insurance company to determine out of pocket expense if they have not yet received this vaccine. Advised may also receive vaccine at local pharmacy or Health Dept. Verbalized acceptance and understanding.  Screening Tests Health Maintenance  Topic Date Due   FOOT EXAM  Never done   Hepatitis C Screening  Never done   DTaP/Tdap/Td (1 - Tdap) Never done   Zoster Vaccines- Shingrix (1 of 2) Never done   DEXA SCAN  Never done   Pneumonia Vaccine 83+ Years old (2 of 2 - PCV) 01/31/2017   Medicare Annual Wellness (AWV)  04/05/2022   COVID-19 Vaccine (4 - 2024-25 season) 02/01/2023   INFLUENZA VACCINE  08/31/2023 (Originally 01/01/2023)   HEMOGLOBIN A1C  12/02/2023   OPHTHALMOLOGY EXAM  01/12/2024   Diabetic kidney evaluation - eGFR measurement  06/03/2024   Diabetic kidney evaluation - Urine ACR  06/03/2024   Colonoscopy  04/27/2026   HPV VACCINES  Aged Out    Health Maintenance  Health Maintenance Due  Topic Date Due   FOOT EXAM  Never done   Hepatitis C Screening  Never done   DTaP/Tdap/Td (1 - Tdap) Never done   Zoster Vaccines- Shingrix (1 of 2) Never done   DEXA SCAN  Never done   Pneumonia Vaccine 61+ Years old (2 of 2 - PCV) 01/31/2017   Medicare Annual Wellness (AWV)  04/05/2022   COVID-19 Vaccine (4 - 2024-25 season) 02/01/2023    Colorectal cancer screening: Type of screening: Colonoscopy. Completed 04/2023. Repeat  every 3 years  Mammogram status: Completed 01/2023. Repeat every year   Bone Density Status Never done  Patient has never had one. Order placed. 06/04/2023 Patient stated that she will call and schedule.   Lung Cancer Screening: (Low Dose CT Chest recommended if Age 32-80 years, 20 pack-year currently smoking OR have quit w/in 15years.) does not qualify.     Additional Screening:  Hepatitis C Screening: does qualify; Completed patient stated that she does not think she needs this.   Vision Screening: Recommended annual ophthalmology exams for early  detection of glaucoma and other disorders of the eye. Is the patient up to date with their annual eye exam?  Yes  Who is the provider or what is the name of the office in which the patient attends annual eye exams? [facility] If pt is not established with a provider, would they like to be referred to a provider to establish care? No .   Dental Screening: Recommended annual dental exams for proper oral hygiene  Diabetic Foot Exam: Diabetic Foot Exam: Overdue, Pt has been advised about the importance in completing this exam. Pt is scheduled for diabetic foot exam on Order was placed for podiatry referral 06/04/2023.Marland Kitchen Patient has an appointment 07/13/23  Community Resource Referral / Chronic Care Management: CRR required this visit?  No   CCM required this visit?  No     Plan:     I have personally reviewed and noted the following in the patient's chart:   Medical and social history Use of alcohol, tobacco or illicit drugs  Current medications and supplements including opioid prescriptions. Patient is not currently taking opioid prescriptions. Functional ability and status Nutritional status Physical activity Advanced directives List of other physicians Hospitalizations, surgeries, and ER visits in previous 12 months Vitals Screenings to include cognitive, depression, and falls Referrals and appointments  In addition, I have reviewed and discussed with patient certain preventive protocols, quality metrics, and best practice recommendations. A written personalized care plan for preventive services as well as general preventive health recommendations were provided to patient.     Sydell Axon, LPN   0/01/8118   After Visit Summary: (Pick Up) Due to this being a telephonic visit, with patients personalized plan was offered to patient and patient has requested to Pick up at office.  Nurse Notes: None

## 2023-07-06 NOTE — Patient Instructions (Addendum)
[name] , Thank you for taking time to come for your Medicare Wellness Visit. I appreciate your ongoing commitment to your health goals. Please review the following plan we discussed and let me know if I can assist you in the future.   Referrals/Orders/Follow-Ups/Clinician Recommendations: Remember to update your vaccines. Tetanus, pneumonia and shingles.  This is a list of the screening recommended for you and due dates:  Health Maintenance  Topic Date Due   Complete foot exam   Never done   Hepatitis C Screening  Never done   DTaP/Tdap/Td vaccine (1 - Tdap) Never done   Zoster (Shingles) Vaccine (1 of 2) Never done   DEXA scan (bone density measurement)  Never done   Pneumonia Vaccine (2 of 2 - PCV) 01/31/2017   Hemoglobin A1C  12/02/2023   Eye exam for diabetics  01/12/2024   Mammogram  01/16/2024   Yearly kidney function blood test for diabetes  06/03/2024   Yearly kidney health urinalysis for diabetes  06/03/2024   Medicare Annual Wellness Visit  07/05/2024   Colon Cancer Screening  04/27/2026   Flu Shot  Completed   COVID-19 Vaccine  Completed   HPV Vaccine  Aged Out    Advanced directives: (Declined) Advance directive discussed with you today. Even though you declined this today, please call our office should you change your mind, and we can give you the proper paperwork for you to fill out.  Next Medicare Annual Wellness Visit scheduled for next year: Yes 07/06/24 @ 8:50

## 2023-07-13 ENCOUNTER — Ambulatory Visit: Payer: Medicare HMO | Admitting: Podiatry

## 2023-07-13 ENCOUNTER — Encounter: Payer: Self-pay | Admitting: Podiatry

## 2023-07-13 DIAGNOSIS — M79674 Pain in right toe(s): Secondary | ICD-10-CM

## 2023-07-13 DIAGNOSIS — M79675 Pain in left toe(s): Secondary | ICD-10-CM

## 2023-07-13 DIAGNOSIS — E119 Type 2 diabetes mellitus without complications: Secondary | ICD-10-CM

## 2023-07-13 DIAGNOSIS — E1143 Type 2 diabetes mellitus with diabetic autonomic (poly)neuropathy: Secondary | ICD-10-CM | POA: Diagnosis not present

## 2023-07-13 DIAGNOSIS — B351 Tinea unguium: Secondary | ICD-10-CM

## 2023-07-13 DIAGNOSIS — Z0189 Encounter for other specified special examinations: Secondary | ICD-10-CM

## 2023-07-13 DIAGNOSIS — L84 Corns and callosities: Secondary | ICD-10-CM | POA: Diagnosis not present

## 2023-07-13 NOTE — Patient Instructions (Signed)

## 2023-07-19 ENCOUNTER — Encounter: Payer: Self-pay | Admitting: Podiatry

## 2023-07-19 NOTE — Progress Notes (Signed)
Subjective: Chief Complaint  Patient presents with   Diabetes    "Cut my toenails." N - toenails L - 1-5 bilateral D - 1 month O - gradually gotten worse C - thick, Diabetic A - none T - I tried to clip them.   [name] presents today for diabetic foot evaluation.  Patient denies any h/o foot wounds.  Patient has h/o foot ulcer of right foot, which healed via help of Wound Care at [facility]..  Patient has been diagnosed with neuropathy.  PCP is Kara Dies, NP.  Past Medical History:  Diagnosis Date   Anemia    Arthritis    hands and knees   Colon polyp 01/30/2016   Tubulovillous adenoma   Diabetes mellitus without complication (HCC) 2016   Diabetic eye exam (HCC) 09/19/2019   Performed at [facility].. Background diabetic retinopathy & diabetic macular edema, follow up one with Dr. Chipper Herb   Hypertension    Wears dentures    full upper and lower    Patient Active Problem List   Diagnosis Date Noted   Hyperlipidemia 06/08/2023   Wound of right foot 09/21/2022   Arthritis 09/21/2022   History of colonic polyps    Polyp of colon    Anemia 07/04/2018   Diabetes (HCC) 07/04/2018   Fall 07/04/2018   Fracture of right hip requiring operative repair [facility]) 07/04/2018   Hypertension 07/04/2018   Visit for screening mammogram 10/20/2017   Benign neoplasm of sigmoid colon 01/30/2016   Benign essential hypertension 01/16/2016    Past Surgical History:  Procedure Laterality Date   ANTERIOR APPROACH HEMI HIP ARTHROPLASTY Right 07/04/2018   [facility]   CATARACT EXTRACTION W/PHACO Right 08/05/2016   Procedure: CATARACT EXTRACTION PHACO AND INTRAOCULAR LENS PLACEMENT (IOC);  Surgeon: Galen Manila, MD;  Location: [facility] ORS;  Service: Ophthalmology;  Laterality: Right;  [place] 39.5 AP% 21.4 CDE 8.45 Fluid pack lot # [id number]   CATARACT EXTRACTION W/PHACO Left 02/01/2019   Procedure: CATARACT EXTRACTION PHACO AND INTRAOCULAR LENS PLACEMENT (IOC) LEFT  DIABETIC  2:00.2  15.0%  18.58;  Surgeon: Galen Manila, MD;  Location: [facility];  Service: Ophthalmology;  Laterality: Left;  Diabeetic - oral meds   COLONOSCOPY WITH PROPOFOL N/A 01/09/2016   Procedure: COLONOSCOPY WITH PROPOFOL;  Surgeon: Christena Deem, MD;  Location: [facility]COPY;  Service: Endoscopy;  Laterality: N/A;   COLONOSCOPY WITH PROPOFOL N/A 01/28/2017   Procedure: COLONOSCOPY WITH PROPOFOL;  Surgeon: Kieth Brightly, MD;  Location: [facility] ENDOSCOPY;  Service: Endoscopy;  Laterality: N/A;   COLONOSCOPY WITH PROPOFOL N/A 12/30/2019   Procedure: COLONOSCOPY WITH PROPOFOL;  Surgeon: Pasty Spillers, MD;  Location: [facility] ENDOSCOPY;  Service: Endoscopy;  Laterality: N/A;   COLONOSCOPY WITH PROPOFOL N/A 04/28/2023   Procedure: COLONOSCOPY WITH PROPOFOL;  Surgeon: Wyline Mood, MD;  Location: [facility] ENDOSCOPY;  Service: Gastroenterology;  Laterality: N/A;   LAPAROSCOPIC SIGMOID COLECTOMY N/A 01/30/2016   Tubulovillous adenoma    Current Outpatient Medications on File Prior to Visit  Medication Sig Dispense Refill   ACCU-CHEK AVIVA PLUS test strip TEST BLOOD SUGAR EVERY DAY 100 strip 10   Accu-Chek Softclix Lancets lancets CHECK BLOOD SUGAR EVERY DAY 100 each 6   acetaminophen (TYLENOL) 500 MG tablet Take 500 mg by mouth every 6 (six) hours as needed for moderate pain or headache.     alendronate (FOSAMAX) 70 MG tablet TAKE 1 TABLET (70 MG TOTAL) BY MOUTH ONCE A WEEK. TAKE WITH A FULL GLASS OF WATER  ON AN EMPTY STOMACH. 12 tablet 3   amLODipine (NORVASC) 5 MG tablet TAKE 1 TABLET EVERY DAY 90 tablet 3   atorvastatin (LIPITOR) 10 MG tablet Take 1 tablet (10 mg total) by mouth daily. 90 tablet 3   Blood Glucose Monitoring Suppl (ACCU-CHEK GUIDE ME) w/Device KIT 1 Device by Does not apply route 4 (four) times daily as needed. 1 kit 0   Calcium Carbonate (CALCIUM 600 PO) Take by mouth daily.     Cholecalciferol (VITAMIN D3) 50 MCG (2000 UT) capsule Take 2,000 Units by  mouth daily.     hydrochlorothiazide (HYDRODIURIL) 25 MG tablet TAKE 1 TABLET EVERY DAY 90 tablet 3   Lancets MISC Accu-Check Guide Me Lancets 100 each 2   metFORMIN (GLUCOPHAGE) 500 MG tablet Take 4 tablets by mouth every day. 360 tablet 3   Multiple Vitamin (MULTIVITAMIN WITH MINERALS) TABS tablet Take 1 tablet by mouth daily.     Polyethyl Glycol-Propyl Glycol (SYSTANE) 0.4-0.3 % SOLN Apply to eye.     potassium chloride SA (KLOR-CON) 20 MEQ tablet Take 1 tablet (20 mEq total) by mouth 2 (two) times daily. 30 tablet 4   vitamin B-12 (CYANOCOBALAMIN) 1000 MCG tablet Take 1,000 mcg by mouth daily.     No current facility-administered medications on file prior to visit.     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  Tobacco Use   Smoking status: Never   Smokeless tobacco: Never  Vaping Use   Vaping status: Never Used  Substance and Sexual Activity   Alcohol use: No   Drug use: No   Sexual activity: Not Currently    Family History  Problem Relation Age of Onset   Breast cancer Sister        10's   Colon cancer Sister     Immunization History  Administered Date(s) Administered   Influenza, High Dose Seasonal PF 04/09/2023   Influenza-Unspecified 03/02/2018   Moderna Covid-19 Fall Seasonal Vaccine 63yrs & older 03/12/2023   Moderna Sars-Covid-2 Vaccination 07/27/2019, 08/24/2019, 04/10/2020   Pneumococcal Polysaccharide-23 02/01/2016    Objective: There were no vitals filed for this visit.  [name] is a pleasant 78 y.o. female WD, WN in NAD. AAO X 3.   Title   Diabetic Foot Exam - detailed Date & Time: 07/13/2023 10:30 AM Diabetic Foot exam was performed with the following findings: Yes  Visual Foot Exam completed.: Yes  Is there a history of foot ulcer?: Yes Is there a foot ulcer now?: No Is there swelling?: No Is  there elevated skin temperature?: No Is there abnormal foot shape?: Yes Is there a claw toe deformity?: No Are the toenails long?: Yes Are the toenails thick?: Yes Are the toenails ingrown?: No Is the skin thin, fragile, shiny and hairless?": No Normal Range of Motion?: No Is there foot or ankle muscle weakness?: No Do you have pain in calf while walking?: No Are the shoes appropriate in style and fit?: Yes Can the patient see the bottom of their feet?: No Pulse Foot Exam completed.: Yes   Right Posterior Tibialis: Present Left posterior Tibialis: Present   Right Dorsalis Pedis: Present Left Dorsalis Pedis: Present     Sensory Foot Exam Completed.: Yes Semmes-Weinstein Monofilament Test "+" means "has sensation" and "-" means "no sensation"  R Foot Test Control: Pos L  Foot Test Control: Pos   R Site 1-Great Toe: Pos L Site 1-Great Toe: Pos   R Site 4: Pos L Site 4: Pos   R site 5: Pos L Site 5: Pos  R Site 6: Pos L Site 6: Pos     Image components are not supported.   Image components are not supported. Image components are not supported.  [place] Right vibratory: present Left vibratory: present  Comments Hyperkeratotic lesion(s) 1st metatarsal head right foot.  No erythema, no edema, no drainage, no fluctuance. Preulcerative lesion noted submet head 1 left foot. There is visible subdermal hemorrhage. There is no surrounding erythema, no edema, no drainage, no odor, no fluctuance.       Lab Results  Component Value Date   HGBA1C 6.2 06/04/2023   No results found. Assessment: 1. Pain due to onychomycosis of toenails of both feet   2. Pre-ulcerative calluses   3. Type 2 diabetes mellitus with diabetic autonomic neuropathy, without long-term current use of insulin (HCC)   4. Encounter for diabetic foot exam (HCC)      ADA Risk Categorization: Low Risk:  Patient has all of the following: Intact protective sensation No prior foot ulcer  No severe  deformity Pedal pulses present  Plan: -Patient was evaluated today. All questions/concerns addressed on today's visit. -Diabetic foot examination performed today. -Continue diabetic foot care principles: inspect feet daily, monitor glucose as recommended by PCP and/or Endocrinologist, and follow prescribed diet per PCP, Endocrinologist and/or dietician. -Patient to continue soft, supportive shoe gear daily. -Toenails 1-5 b/l were debrided in length and girth with sterile nail nippers and dremel without iatrogenic bleeding.  -Callus(es) 1st metatarsal head right foot pared utilizing sterile scalpel blade without complication or incident. Total number debrided =1. -Preulcerative lesion pared submet head 1 left foot utilizing sterile scalpel blade. Total number pared=1. -Patient/POA to call should there be question/concern in the interim.  Return in about 10 weeks (around 09/21/2023).  Freddie Breech, DPM      [place] LOCATION: [address], [place] [address]                   Office [phone]   [facility] LOCATION: [address] [place], [place] [address] Office [phone]

## 2023-07-21 DIAGNOSIS — E113211 Type 2 diabetes mellitus with mild nonproliferative diabetic retinopathy with macular edema, right eye: Secondary | ICD-10-CM | POA: Diagnosis not present

## 2023-09-02 ENCOUNTER — Other Ambulatory Visit: Payer: Self-pay | Admitting: Nurse Practitioner

## 2023-09-02 DIAGNOSIS — E1143 Type 2 diabetes mellitus with diabetic autonomic (poly)neuropathy: Secondary | ICD-10-CM

## 2023-09-02 DIAGNOSIS — I159 Secondary hypertension, unspecified: Secondary | ICD-10-CM

## 2023-09-04 ENCOUNTER — Other Ambulatory Visit: Payer: Medicare HMO

## 2023-09-15 DIAGNOSIS — E113211 Type 2 diabetes mellitus with mild nonproliferative diabetic retinopathy with macular edema, right eye: Secondary | ICD-10-CM | POA: Diagnosis not present

## 2023-09-25 ENCOUNTER — Encounter: Payer: Self-pay | Admitting: Podiatry

## 2023-09-25 ENCOUNTER — Ambulatory Visit: Payer: Medicare HMO | Admitting: Podiatry

## 2023-09-25 VITALS — Ht 63.0 in | Wt 130.0 lb

## 2023-09-25 DIAGNOSIS — M79675 Pain in left toe(s): Secondary | ICD-10-CM

## 2023-09-25 DIAGNOSIS — B351 Tinea unguium: Secondary | ICD-10-CM

## 2023-09-25 DIAGNOSIS — E1143 Type 2 diabetes mellitus with diabetic autonomic (poly)neuropathy: Secondary | ICD-10-CM

## 2023-09-25 DIAGNOSIS — M79674 Pain in right toe(s): Secondary | ICD-10-CM | POA: Diagnosis not present

## 2023-09-25 DIAGNOSIS — L84 Corns and callosities: Secondary | ICD-10-CM | POA: Diagnosis not present

## 2023-10-01 NOTE — Progress Notes (Signed)
 Subjective:  Patient ID: [name], female    DOB: [birth year],  MRN: [id number]  78 y.o. female presents at risk foot care with history of diabetic neuropathy and preulcerative lesion(s) left foot and painful mycotic toenails that limit ambulation. Painful toenails interfere with ambulation. Aggravating factors include wearing enclosed shoe gear. Pain is relieved with periodic professional debridement. Painful preulcerative lesion(s) is/are aggravated when weightbearing with and without shoegear. Pain is relieved with periodic professional debridement.  Chief Complaint  Patient presents with   Nail Problem    Pt is here for [facility] last A1C was 6 PCP is Dr Deborra Falter and LOV was in January.    New problem(s): None   PCP is Tona Francis, NP.  Allergies  Allergen Reactions   Oxycodone  Nausea And Vomiting    Review of Systems: Negative except as noted in the HPI.   Objective:  AREANA BROSSETT is a pleasant 78 y.o. female WD, WN in NAD. AAO x 3.  Vascular Examination: Vascular status intact b/l with palpable pedal pulses. CFT immediate b/l. Pedal hair present. No edema. No pain with calf compression b/l. Skin temperature gradient WNL b/l. No varicosities noted. No cyanosis or clubbing noted.  Neurological Examination: Sensation grossly intact b/l with 10 gram monofilament. Vibratory sensation intact b/l.  Dermatological Examination: Pedal skin with normal turgor, texture and tone b/l. No open wounds nor interdigital macerations noted. Toenails 1-5 b/l thick, discolored, elongated with subungual debris and pain on dorsal palpation. Hyperkeratotic lesion(s) submet heads 4, 5 right foot.  No erythema, no edema, no drainage, no fluctuance. Preulcerative lesion noted submet head 1, 3 left foot. There is visible subdermal hemorrhage. There is no surrounding erythema, no edema, no drainage, no odor, no fluctuance.  Musculoskeletal Examination: Muscle strength 5/5 to b/l LE.  No pain,  crepitus noted b/l. No gross pedal deformities. Patient ambulates independently without assistive aids.   Radiographs: None  Last A1c:      Latest Ref Rng & Units 06/04/2023    2:25 PM  Hemoglobin A1C  Hemoglobin-A1c 4.6 - 6.5 % 6.2      Assessment:   1. Pain due to onychomycosis of toenails of both feet   2. Pre-ulcerative calluses   3. Type 2 diabetes mellitus with diabetic autonomic neuropathy, without long-term current use of insulin (HCC)     Plan:  -Consent given for treatment as described below: -Examined patient. -Continue foot and shoe inspections daily. Monitor blood glucose per PCP/Endocrinologist's recommendations. -Rx for [employer] [facility] [employer] dispensed to patient for one pair diabetic shoes and 3 pair total contact insoles. -Toenails 1-5 b/l were debrided in length and girth with sterile nail nippers and dremel without iatrogenic bleeding.  -Callus(es) submet head 4 right foot and submet head 5 right foot pared utilizing sterile scalpel blade without complication or incident. Total number debrided =2. -Preulcerative lesion pared submet head 1 left foot and submet head 3 left foot utilizing sterile scalpel blade. Total number pared=2. -Patient/POA to call should there be question/concern in the interim.  Return in about 3 months (around 12/25/2023).  Luella Sager, DPM      [place] LOCATION: [address], [place] [address]  Office [phone]   [facility] LOCATION: [address] [place], [place] [address] Office [phone]

## 2023-11-10 DIAGNOSIS — E113211 Type 2 diabetes mellitus with mild nonproliferative diabetic retinopathy with macular edema, right eye: Secondary | ICD-10-CM | POA: Diagnosis not present

## 2023-11-16 ENCOUNTER — Other Ambulatory Visit: Payer: Self-pay | Admitting: Nurse Practitioner

## 2023-11-16 DIAGNOSIS — E1143 Type 2 diabetes mellitus with diabetic autonomic (poly)neuropathy: Secondary | ICD-10-CM

## 2023-11-16 DIAGNOSIS — I159 Secondary hypertension, unspecified: Secondary | ICD-10-CM

## 2023-11-27 ENCOUNTER — Other Ambulatory Visit

## 2023-12-03 ENCOUNTER — Ambulatory Visit: Payer: Medicare PPO | Admitting: Nurse Practitioner

## 2023-12-25 ENCOUNTER — Telehealth: Payer: Self-pay

## 2023-12-25 ENCOUNTER — Ambulatory Visit: Admitting: Nurse Practitioner

## 2023-12-25 ENCOUNTER — Encounter: Payer: Self-pay | Admitting: Podiatry

## 2023-12-25 ENCOUNTER — Ambulatory Visit: Admitting: Podiatry

## 2023-12-25 DIAGNOSIS — M2041 Other hammer toe(s) (acquired), right foot: Secondary | ICD-10-CM

## 2023-12-25 DIAGNOSIS — B351 Tinea unguium: Secondary | ICD-10-CM

## 2023-12-25 DIAGNOSIS — E1143 Type 2 diabetes mellitus with diabetic autonomic (poly)neuropathy: Secondary | ICD-10-CM

## 2023-12-25 DIAGNOSIS — L84 Corns and callosities: Secondary | ICD-10-CM

## 2023-12-25 DIAGNOSIS — M79675 Pain in left toe(s): Secondary | ICD-10-CM | POA: Diagnosis not present

## 2023-12-25 DIAGNOSIS — M2042 Other hammer toe(s) (acquired), left foot: Secondary | ICD-10-CM | POA: Diagnosis not present

## 2023-12-25 DIAGNOSIS — M79674 Pain in right toe(s): Secondary | ICD-10-CM | POA: Diagnosis not present

## 2023-12-25 MED ORDER — MUPIROCIN 2 % EX OINT: TOPICAL_OINTMENT | CUTANEOUS | 1 refills | Status: AC

## 2023-12-25 NOTE — Telephone Encounter (Signed)
 Referral/order form, office notes and demographics faxed to [facility] for diabetic shoes Phone [phone], fax [phone]

## 2023-12-25 NOTE — Progress Notes (Signed)
 Subjective:  Patient ID: [name], female    DOB: 01/31/1946,  MRN: [id number]  [name] presents to clinic today for at risk foot care with history of diabetic neuropathy and preulcerative lesion(s) of both feet and painful mycotic toenails that limit ambulation. Painful toenails interfere with ambulation. Aggravating factors include wearing enclosed shoe gear. Pain is relieved with periodic professional debridement. Painful preulcerative lesion(s) is/are aggravated when weightbearing with and without shoegear. Pain is relieved with periodic professional debridement.  Patient states she has been unsuccessful in obtaining her diabetic shoes. Her appointment was cancelled due to staffing and she has not been able to obtain a new one. She has h/o wound right foot treated at [facility]. Chief Complaint  Patient presents with   Nail Problem    Thick painful toenails, 3 month follow up     PCP is Vincente Saber, NP. [name] 06/04/2023.  Allergies  Allergen Reactions   Oxycodone  Nausea And Vomiting    Review of Systems: Negative except as noted in the HPI.  Objective: No changes noted in today's physical examination. There were no vitals filed for this visit. [name] is a pleasant 78 y.o. female WD, WN in NAD. AAO x 3.  Vascular Examination: Vascular status intact b/l with palpable pedal pulses. CFT immediate b/l. Pedal hair present. No edema. No pain with calf compression b/l. Skin temperature gradient WNL b/l. No varicosities noted. No cyanosis or clubbing noted.  Neurological Examination: Sensation grossly intact b/l with 10 gram monofilament. Vibratory sensation intact b/l.  Dermatological Examination: Pedal skin with normal turgor, texture and tone b/l. No open wounds nor interdigital macerations noted. Toenails left great toe and 2-5 b/l thick, discolored, elongated with subungual debris and pain on dorsal palpation.   There is noted onchyolysis  of entire nailplate of right great toe.  The nailbed remains intact. There is no erythema, no edema, no drainage, no underlying fluctuance.  Hyperkeratotic lesion(s) submet heads 4, 5 right foot.  No erythema, no edema, no drainage, no fluctuance.   Preulcerative lesion noted submet head 1, 4 left foot. There is visible subdermal hemorrhage. There is no surrounding erythema, no edema, no drainage, no odor, no fluctuance.  Musculoskeletal Examination: Muscle strength 5/5 to b/l LE.  No pain, crepitus noted b/l. Hammertoes 2-5 b/l.  Radiographs: None  Assessment/Plan: 1. Pain due to onychomycosis of toenails of both feet   2. Pre-ulcerative calluses   3. Acquired hammertoes of both feet   4. Type 2 diabetes mellitus with diabetic autonomic neuropathy, without long-term current use of insulin (HCC)    Orders Placed This Encounter  Procedures   For home use only DME Other see comment    To [facility]:  Dispense one pair extra depth shoes and 3 pair custom insoles. Offload preulcerative lesion submet head 1 bilaterally, submet head 4 bilaterally and submet head 5 right foot.    Length of Need:   6 Months    Meds ordered this encounter  Medications   mupirocin  ointment (BACTROBAN ) 2 %    Sig: Apply to bottom of left foot once daily.    Dispense:  30 g    Refill:  1    -Patient was evaluated today. All questions/concerns addressed on today's visit. -Patient to continue soft, supportive shoe gear daily. -Rx to be sent to [facility] for one pair diabetic shoes and 3 pair custom insoles. To [facility]  [address] [place], [place] [address] Phone: [phone] Fax: [phone]. -Beryl were debrided in length and girth 2-5 bilaterally and left great toe with sterile nail nippers and dremel without iatrogenic bleeding.  -Loose nailplate right great toe gently debrided en toto. Digital nailbed cleansed  with alcohol. Triple antibiotic ointment applied to nailbed followed by light dressing. No further treatment required by patient. -Preulcerative lesion pared submet head 1 b/l, submet head 4 b/l, and submet head 5 right foot utilizing sterile scalpel blade. Total number pared=5. -Prescription written for Mupirocin  Ointment. Patient is to apply to plantar aspect of left foot once daily and cover with dressing.  -Patient/POA to call should there be question/concern in the interim.   Return in about 3 months (around 03/26/2024).  [name], DPM      [place] LOCATION: [address], [place] [address]                   Office [phone]   [facility] LOCATION: [address] [place], [place] [address] Office [phone]

## 2023-12-29 ENCOUNTER — Ambulatory Visit (INDEPENDENT_AMBULATORY_CARE_PROVIDER_SITE_OTHER): Admitting: Nurse Practitioner

## 2023-12-29 ENCOUNTER — Encounter: Payer: Self-pay | Admitting: Nurse Practitioner

## 2023-12-29 VITALS — BP 118/70 | HR 73 | Temp 97.8°F | Ht 63.0 in | Wt 141.4 lb

## 2023-12-29 DIAGNOSIS — E1122 Type 2 diabetes mellitus with diabetic chronic kidney disease: Secondary | ICD-10-CM

## 2023-12-29 DIAGNOSIS — M810 Age-related osteoporosis without current pathological fracture: Secondary | ICD-10-CM

## 2023-12-29 DIAGNOSIS — E785 Hyperlipidemia, unspecified: Secondary | ICD-10-CM

## 2023-12-29 DIAGNOSIS — N1831 Chronic kidney disease, stage 3a: Secondary | ICD-10-CM

## 2023-12-29 DIAGNOSIS — I10 Essential (primary) hypertension: Secondary | ICD-10-CM

## 2023-12-29 DIAGNOSIS — Z13820 Encounter for screening for osteoporosis: Secondary | ICD-10-CM

## 2023-12-29 DIAGNOSIS — Z7984 Long term (current) use of oral hypoglycemic drugs: Secondary | ICD-10-CM | POA: Diagnosis not present

## 2023-12-29 NOTE — Progress Notes (Signed)
 Established Patient Office Visit  Subjective:  Patient ID: [name], female    DOB: 04-14-1946  Age: 78 y.o. MRN: [id number]  CC:  Chief Complaint  Patient presents with   Medical Management of Chronic Issues   Discussed the use of a AI scribe software for clinical note transcription with the patient, who gave verbal consent to proceed.  HPI [name] is a 78 year old female who presents for routine      follow up. history of hypertension, diabetes, and rheumatoid arthritis, presents for a routine follow-up. She has no new health concerns. They have been adherent to their medication regimen.  Diabetes : Managed by metformin .  She is arranging a diabetic shoe fitting with [facility]. She applies an ointment to the bottom of her left foot.   She receives eye injections every eight to nine weeks at [facility]. Her medications include amlodipine , hydrochlorothiazide , metformin , Fosamax , and a cholesterol medication, with no recent changes. She occasionally experiences numbness and tingling in her feet and hands but has not been diagnosed with neuropathy or arthritis.  HPI   Past Medical History:  Diagnosis Date   Anemia    Arthritis    hands and knees   Colon polyp 01/30/2016   Tubulovillous adenoma   Diabetes mellitus without complication (HCC) 2016   Diabetic eye exam (HCC) 09/19/2019   Performed at [facility].. Background diabetic retinopathy & diabetic macular edema, follow up one with Dr. Laurita   Hypertension    Wears dentures    full upper and lower    Past Surgical History:  Procedure Laterality Date   ANTERIOR APPROACH HEMI HIP ARTHROPLASTY Right 07/04/2018   [facility]   CATARACT EXTRACTION W/PHACO Right 08/05/2016   Procedure: CATARACT EXTRACTION PHACO AND INTRAOCULAR LENS PLACEMENT (IOC);  Surgeon: Elsie Carmine, MD;  Location: [facility] ORS;  Service: Ophthalmology;  Laterality: Right;  US  39.5 AP% 21.4 CDE 8.45 Fluid pack lot #  [id number]   CATARACT EXTRACTION W/PHACO Left 02/01/2019   Procedure: CATARACT EXTRACTION PHACO AND INTRAOCULAR LENS PLACEMENT (IOC) LEFT DIABETIC  2:00.2  15.0%  18.58;  Surgeon: Carmine Elsie, MD;  Location: [facility];  Service: Ophthalmology;  Laterality: Left;  Diabeetic - oral meds   COLONOSCOPY WITH PROPOFOL  N/A 01/09/2016   Procedure: COLONOSCOPY WITH PROPOFOL ;  Surgeon: Gladis RAYMOND Mariner, MD;  Location: [facility] ENDOSCOPY;  Service: Endoscopy;  Laterality: N/A;   COLONOSCOPY WITH PROPOFOL  N/A 01/28/2017   Procedure: COLONOSCOPY WITH PROPOFOL ;  Surgeon: Dellie Louanne MATSU, MD;  Location: [facility] ENDOSCOPY;  Service: Endoscopy;  Laterality: N/A;   COLONOSCOPY WITH PROPOFOL  N/A 12/30/2019   Procedure: COLONOSCOPY WITH PROPOFOL ;  Surgeon: Janalyn Keene NOVAK, MD;  Location: [facility] ENDOSCOPY;  Service: Endoscopy;  Laterality: N/A;   COLONOSCOPY WITH PROPOFOL  N/A 04/28/2023   Procedure: COLONOSCOPY WITH PROPOFOL ;  Surgeon: Therisa Bi, MD;  Location: [facility] ENDOSCOPY;  Service: Gastroenterology;  Laterality: N/A;   LAPAROSCOPIC SIGMOID COLECTOMY N/A 01/30/2016   Tubulovillous adenoma    Family History  Problem Relation Age of Onset   Breast cancer Sister        97's   Colon cancer Sister     Social History   Socioeconomic History   Marital status: Widowed    Spouse name: Not on file   Number of children: 3   Years of education: Not on file   Highest education level: 12th grade  Occupational History   Occupation: Retired    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  Tobacco Use   Smoking status: Never   Smokeless tobacco: Never  Vaping Use   Vaping status: Never Used  Substance and Sexual Activity   Alcohol use: No   Drug use: No   Sexual activity: Not Currently  Other Topics Concern   Not on file  Social History Narrative   Lives at home with daughter   Social Drivers of Health   Financial Resource Strain: Low Risk  (07/06/2023)   Overall Financial Resource  Strain (CARDIA)    Difficulty of Paying Living Expenses: Not hard at all  Food Insecurity: No Food Insecurity (07/06/2023)   Hunger Vital Sign    Worried About Running Out of Food in the Last Year: Never true    Ran Out of Food in the Last Year: Never true  Transportation Needs: No Transportation Needs (07/06/2023)   PRAPARE - Administrator, Civil Service (Medical): No    Lack of Transportation (Non-Medical): No  Physical Activity: Inactive (07/06/2023)   Exercise Vital Sign    Days of Exercise per Week: 0 days    Minutes of Exercise per Session: 0 min  Stress: No Stress Concern Present (07/06/2023)   [employer] of Occupational Health - Occupational Stress Questionnaire    Feeling of Stress : Not at all  Social Connections: Moderately Integrated (07/06/2023)   Social Connection and Isolation Panel    Frequency of Communication with Friends and Family: More than three times a week    Frequency of Social Gatherings with Friends and Family: More than three times a week    Attends Religious Services: More than 4 times per year    Active Member of [employer] or Organizations: Yes    Attends Banker Meetings: More than 4 times per year    Marital Status: Widowed  Intimate Partner Violence: Not At Risk (07/06/2023)   Humiliation, Afraid, Rape, and Kick questionnaire    Fear of Current or Ex-Partner: No    Emotionally Abused: No    Physically Abused: No    Sexually Abused: No     Outpatient Medications Prior to Visit  Medication Sig Dispense Refill   ACCU-CHEK AVIVA PLUS test strip TEST BLOOD SUGAR EVERY DAY 100 strip 10   Accu-Chek Softclix Lancets lancets CHECK BLOOD SUGAR EVERY DAY 100 each 6   acetaminophen  (TYLENOL ) 500 MG tablet Take 500 mg by mouth every 6 (six) hours as needed for moderate pain or headache.     alendronate  (FOSAMAX ) 70 MG tablet TAKE 1 TABLET (70 MG TOTAL) BY MOUTH ONCE A WEEK. TAKE WITH A FULL GLASS OF WATER ON AN EMPTY STOMACH. 12 tablet 3    amLODipine  (NORVASC ) 5 MG tablet TAKE 1 TABLET EVERY DAY 90 tablet 3   atorvastatin  (LIPITOR) 10 MG tablet TAKE 1 TABLET EVERY DAY 90 tablet 1   Blood Glucose Monitoring Suppl (ACCU-CHEK GUIDE ME) w/Device KIT 1 Device by Does not apply route 4 (four) times daily as needed. 1 kit 0   Calcium  Carbonate (CALCIUM  600 PO) Take by mouth daily.     Cholecalciferol (VITAMIN D3) 50 MCG (2000 UT) capsule Take 2,000 Units by mouth daily.     hydrochlorothiazide  (HYDRODIURIL ) 25 MG tablet TAKE 1 TABLET EVERY DAY 90 tablet 3   Lancets MISC Accu-Check Guide Me Lancets 100 each 2   metFORMIN  (GLUCOPHAGE ) 500 MG tablet TAKE 4 TABLETS EVERY DAY 360 tablet 1   Multiple Vitamin (MULTIVITAMIN WITH MINERALS) TABS tablet Take  1 tablet by mouth daily.     mupirocin  ointment (BACTROBAN ) 2 % Apply to bottom of left foot once daily. 30 g 1   Polyethyl Glycol-Propyl Glycol (SYSTANE) 0.4-0.3 % SOLN Apply to eye.     potassium chloride  SA (KLOR-CON ) 20 MEQ tablet Take 1 tablet (20 mEq total) by mouth 2 (two) times daily. 30 tablet 4   vitamin B-12 (CYANOCOBALAMIN) 1000 MCG tablet Take 1,000 mcg by mouth daily.     No facility-administered medications prior to visit.    Allergies  Allergen Reactions   Oxycodone  Nausea And Vomiting    ROS Review of Systems Negative unless indicated in HPI.    Objective:    Physical Exam Constitutional:      Appearance: Normal appearance.  HENT:     Mouth/Throat:     Mouth: Mucous membranes are moist.  Eyes:     Conjunctiva/sclera: Conjunctivae normal.     Pupils: Pupils are equal, round, and reactive to light.  Cardiovascular:     Rate and Rhythm: Normal rate and regular rhythm.     Pulses: Normal pulses.     Heart sounds: Normal heart sounds.  Pulmonary:     Effort: Pulmonary effort is normal.     Breath sounds: Normal breath sounds.  Abdominal:     General: Bowel sounds are normal.     Palpations: Abdomen is soft.  Musculoskeletal:     Cervical back: Normal  range of motion. No tenderness.  Skin:    General: Skin is warm.     Findings: No bruising.  Neurological:     General: No focal deficit present.     Mental Status: She is alert and oriented to person, place, and time. Mental status is at baseline.  Psychiatric:        Mood and Affect: Mood normal.        Behavior: Behavior normal.        Thought Content: Thought content normal.        Judgment: Judgment normal.     BP 118/70   Pulse 73   Temp 97.8 F (36.6 C)   Ht 5' 3 (1.6 m)   Wt 141 lb 6.4 oz (64.1 kg)   SpO2 97%   BMI 25.05 kg/m  Wt Readings from Last 3 Encounters:  12/29/23 141 lb 6.4 oz (64.1 kg)  09/25/23 130 lb (59 kg)  07/06/23 130 lb (59 kg)     Health Maintenance  Topic Date Due   Hepatitis C Screening  Never done   DTaP/Tdap/Td (1 - Tdap) Never done   Zoster Vaccines- Shingrix (1 of 2) Never done   Pneumococcal Vaccine: 50+ Years (2 of 2 - PCV) 01/31/2017   INFLUENZA VACCINE  01/01/2024   COVID-19 Vaccine (5 - 2024-25 season) 01/13/2024 (Originally 09/10/2023)   OPHTHALMOLOGY EXAM  01/12/2024   MAMMOGRAM  01/16/2024   HEMOGLOBIN A1C  06/30/2024   Medicare Annual Wellness (AWV)  07/05/2024   FOOT EXAM  07/12/2024   Diabetic kidney evaluation - eGFR measurement  12/28/2024   Diabetic kidney evaluation - Urine ACR  01/04/2025   Colonoscopy  04/27/2026   DEXA SCAN  Completed   Hepatitis B Vaccines  Aged Out   HPV VACCINES  Aged Out   Meningococcal B Vaccine  Aged Out    There are no preventive care reminders to display for this patient.  Lab Results  Component Value Date   TSH 0.64 06/04/2023   Lab Results  Component Value Date  WBC 7.9 06/04/2023   HGB 11.6 (L) 06/04/2023   HCT 34.2 (L) 06/04/2023   MCV 82.0 06/04/2023   PLT 210.0 06/04/2023   Lab Results  Component Value Date   NA 139 12/29/2023   K 3.6 12/29/2023   CO2 29 12/29/2023   GLUCOSE 91 12/29/2023   BUN 19 12/29/2023   CREATININE 1.22 (H) 12/29/2023   BILITOT 0.4  12/29/2023   ALKPHOS 75 12/29/2023   AST 51 (H) 12/29/2023   ALT 58 (H) 12/29/2023   PROT 8.5 (H) 12/29/2023   ALBUMIN 3.9 12/29/2023   CALCIUM  10.0 12/29/2023   ANIONGAP 7 01/31/2016   GFR 43.13 (L) 12/29/2023   Lab Results  Component Value Date   CHOL 129 06/04/2023   Lab Results  Component Value Date   HDL 54.10 06/04/2023   Lab Results  Component Value Date   LDLCALC 62 06/04/2023   Lab Results  Component Value Date   TRIG 61.0 06/04/2023   Lab Results  Component Value Date   CHOLHDL 2 06/04/2023   Lab Results  Component Value Date   HGBA1C 6.5 12/29/2023      Assessment & Plan:  Hyperlipidemia, unspecified hyperlipidemia type Assessment & Plan: Managed on Atorvastatin  10mg  daily. -Continue current regimen.   Benign essential hypertension Assessment & Plan: Stable on medication. - Continue amlodipine  and hydrochlorothiazide  as prescribed. - Will check labs as outlined   Orders: -     Comprehensive metabolic panel with GFR  Screening for osteoporosis -     HM DEXA SCAN; Future  Type 2 diabetes mellitus with stage 3a chronic kidney disease, without long-term current use of insulin (HCC) Assessment & Plan: Ongoing management with stable medication regimen.  Previous A1c 6.2 - Await call from [facility] for diabetic shoe fitting appointment. - Perform A1c test to assess glycemic control. - Continue metformin  as prescribed.  Orders: -     Hemoglobin A1c -     Microalbumin / creatinine urine ratio; Future  Osteoporosis, unspecified osteoporosis type, unspecified pathological fracture presence Assessment & Plan: Managed with Fosamax , DEXA scan needed to assess bone density. - Order DEXA scan to assess bone density. - Continue Fosamax  as prescribed.  Orders: -     HM DEXA SCAN    Follow-up: No follow-ups on file.   Jionni Helming, NP

## 2023-12-29 NOTE — Patient Instructions (Signed)
 VISIT SUMMARY:  Today, we discussed your diabetic shoe fitting, eye injections, and reviewed your current medications. We also planned some tests to monitor your diabetes and bone health.  YOUR PLAN:  TYPE 2 DIABETES MELLITUS: You are managing your diabetes with a stable medication regimen. -Wait for a call from [facility] to schedule your diabetic shoe fitting. -We will perform an A1c test to check your blood sugar control. -Continue taking metformin  as prescribed.  HYPERTENSION: Your blood pressure is well-controlled with your current medications. -Continue taking amlodipine  and hydrochlorothiazide  as prescribed.  HYPERLIPIDEMIA: Your cholesterol levels are being managed with your current medication. -Continue taking your cholesterol medication as prescribed.  OSTEOPOROSIS: We are managing your bone health with Fosamax  and need to assess your bone density. -We will order a DEXA scan to check your bone density. -Continue taking Fosamax  as prescribed.  DRY EYES: You are receiving regular injections for dry eyes, which sometimes cause blurriness in your right eye. -Continue your regular eye injections at [facility] every eight to nine weeks.

## 2023-12-30 LAB — COMPREHENSIVE METABOLIC PANEL WITH GFR
ALT: 58 U/L — ABNORMAL HIGH (ref 0–35)
AST: 51 U/L — ABNORMAL HIGH (ref 0–37)
Albumin: 3.9 g/dL (ref 3.5–5.2)
Alkaline Phosphatase: 75 U/L (ref 39–117)
BUN: 19 mg/dL (ref 6–23)
CO2: 29 meq/L (ref 19–32)
Calcium: 10 mg/dL (ref 8.4–10.5)
Chloride: 101 meq/L (ref 96–112)
Creatinine, Ser: 1.22 mg/dL — ABNORMAL HIGH (ref 0.40–1.20)
GFR: 43.13 mL/min — ABNORMAL LOW (ref >60.00)
Glucose, Bld: 91 mg/dL (ref 70–99)
Potassium: 3.6 meq/L (ref 3.5–5.1)
Sodium: 139 meq/L (ref 135–145)
Total Bilirubin: 0.4 mg/dL (ref 0.2–1.2)
Total Protein: 8.5 g/dL — ABNORMAL HIGH (ref 6.0–8.3)

## 2023-12-30 LAB — HEMOGLOBIN A1C: Hgb A1c MFr Bld: 6.5 % (ref 4.6–6.5)

## 2024-01-05 DIAGNOSIS — E113211 Type 2 diabetes mellitus with mild nonproliferative diabetic retinopathy with macular edema, right eye: Secondary | ICD-10-CM | POA: Diagnosis not present

## 2024-01-05 LAB — MICROALBUMIN / CREATININE URINE RATIO
Creatinine,U: 46 mg/dL
Microalb Creat Ratio: 26.2 mg/g (ref 0.0–30.0)
Microalb, Ur: 1.2 mg/dL (ref 0.0–1.9)

## 2024-01-06 ENCOUNTER — Ambulatory Visit: Payer: Self-pay | Admitting: Nurse Practitioner

## 2024-01-06 DIAGNOSIS — M810 Age-related osteoporosis without current pathological fracture: Secondary | ICD-10-CM | POA: Insufficient documentation

## 2024-01-06 NOTE — Assessment & Plan Note (Signed)
 Managed with Fosamax , DEXA scan needed to assess bone density. - Order DEXA scan to assess bone density. - Continue Fosamax  as prescribed.

## 2024-01-06 NOTE — Assessment & Plan Note (Signed)
 Ongoing management with stable medication regimen.  Previous A1c 6.2 - Await call from [facility] for diabetic shoe fitting appointment. - Perform A1c test to assess glycemic control. - Continue metformin  as prescribed.

## 2024-01-06 NOTE — Assessment & Plan Note (Signed)
 Managed on Atorvastatin 10mg  daily. -Continue current regimen.

## 2024-01-06 NOTE — Assessment & Plan Note (Signed)
 Stable on medication. - Continue amlodipine  and hydrochlorothiazide  as prescribed. - Will check labs as outlined

## 2024-01-07 ENCOUNTER — Other Ambulatory Visit: Payer: Self-pay

## 2024-01-07 DIAGNOSIS — E785 Hyperlipidemia, unspecified: Secondary | ICD-10-CM

## 2024-01-07 DIAGNOSIS — E1122 Type 2 diabetes mellitus with diabetic chronic kidney disease: Secondary | ICD-10-CM

## 2024-01-22 ENCOUNTER — Telehealth: Payer: Self-pay | Admitting: Nurse Practitioner

## 2024-01-22 NOTE — Telephone Encounter (Signed)
 lab order needed

## 2024-01-29 NOTE — Telephone Encounter (Signed)
 Labs are in. It has to be released when pt comes.

## 2024-02-04 ENCOUNTER — Other Ambulatory Visit

## 2024-02-04 DIAGNOSIS — N1831 Chronic kidney disease, stage 3a: Secondary | ICD-10-CM | POA: Diagnosis not present

## 2024-02-04 DIAGNOSIS — E785 Hyperlipidemia, unspecified: Secondary | ICD-10-CM

## 2024-02-04 DIAGNOSIS — E1122 Type 2 diabetes mellitus with diabetic chronic kidney disease: Secondary | ICD-10-CM | POA: Diagnosis not present

## 2024-02-04 LAB — HEPATIC FUNCTION PANEL
ALT: 57 U/L — ABNORMAL HIGH (ref 0–35)
AST: 53 U/L — ABNORMAL HIGH (ref 0–37)
Albumin: 3.8 g/dL (ref 3.5–5.2)
Alkaline Phosphatase: 69 U/L (ref 39–117)
Bilirubin, Direct: 0.1 mg/dL (ref 0.0–0.3)
Total Bilirubin: 0.5 mg/dL (ref 0.2–1.2)
Total Protein: 8.5 g/dL — ABNORMAL HIGH (ref 6.0–8.3)

## 2024-02-04 NOTE — Addendum Note (Signed)
 Addended by: FROYLAN SOR on: 02/04/2024 08:25 AM   Modules accepted: Orders

## 2024-02-08 ENCOUNTER — Other Ambulatory Visit: Payer: Self-pay | Admitting: Nurse Practitioner

## 2024-02-29 ENCOUNTER — Other Ambulatory Visit: Payer: Self-pay | Admitting: Nurse Practitioner

## 2024-02-29 DIAGNOSIS — Z1231 Encounter for screening mammogram for malignant neoplasm of breast: Secondary | ICD-10-CM

## 2024-03-01 LAB — OPHTHALMOLOGY REPORT-SCANNED

## 2024-03-17 ENCOUNTER — Ambulatory Visit
Admission: RE | Admit: 2024-03-17 | Discharge: 2024-03-17 | Disposition: A | Discharge status: Home / Self Care | Source: Ambulatory Visit | Attending: Nurse Practitioner | Admitting: Nurse Practitioner

## 2024-03-17 DIAGNOSIS — Z1231 Encounter for screening mammogram for malignant neoplasm of breast: Secondary | ICD-10-CM | POA: Diagnosis not present

## 2024-03-22 ENCOUNTER — Ambulatory Visit: Payer: Self-pay | Admitting: Nurse Practitioner

## 2024-03-22 NOTE — Telephone Encounter (Signed)
 Patient returned our call.  I read message from Chelsea Aurora, NP, to patient.

## 2024-03-22 NOTE — Progress Notes (Signed)
 The mammogram is normal. Please continue regular screening.

## 2024-03-28 ENCOUNTER — Ambulatory Visit (INDEPENDENT_AMBULATORY_CARE_PROVIDER_SITE_OTHER)

## 2024-03-28 ENCOUNTER — Encounter: Payer: Self-pay | Admitting: Podiatry

## 2024-03-28 ENCOUNTER — Ambulatory Visit: Admitting: Podiatry

## 2024-03-28 ENCOUNTER — Other Ambulatory Visit: Payer: Self-pay

## 2024-03-28 DIAGNOSIS — B351 Tinea unguium: Secondary | ICD-10-CM | POA: Diagnosis not present

## 2024-03-28 DIAGNOSIS — L97422 Non-pressure chronic ulcer of left heel and midfoot with fat layer exposed: Secondary | ICD-10-CM

## 2024-03-28 DIAGNOSIS — M79675 Pain in left toe(s): Secondary | ICD-10-CM | POA: Diagnosis not present

## 2024-03-28 DIAGNOSIS — E08621 Diabetes mellitus due to underlying condition with foot ulcer: Secondary | ICD-10-CM | POA: Diagnosis not present

## 2024-03-28 DIAGNOSIS — M2041 Other hammer toe(s) (acquired), right foot: Secondary | ICD-10-CM

## 2024-03-28 DIAGNOSIS — E1143 Type 2 diabetes mellitus with diabetic autonomic (poly)neuropathy: Secondary | ICD-10-CM

## 2024-03-28 DIAGNOSIS — M79674 Pain in right toe(s): Secondary | ICD-10-CM | POA: Diagnosis not present

## 2024-03-28 MED ORDER — GENTAMICIN SULFATE 0.1 % EX CREA: TOPICAL_CREAM | CUTANEOUS | 1 refills | Status: AC

## 2024-03-28 MED ORDER — DOXYCYCLINE HYCLATE 100 MG PO CAPS: 100.0000 mg | ORAL_CAPSULE | Freq: Two times a day (BID) | ORAL | 0 refills | Status: AC

## 2024-03-28 NOTE — Progress Notes (Unsigned)
 Subjective:  Patient ID: [name], female    DOB: [birth year],  MRN: [id number] Chief Complaint  Patient presents with   Toe Pain    NP Vincente is her PCP.  Last visit was in Jan. A1c is 6 something   78 y.o. female is seen today for at risk foot care with history of diabetic neuropathy and preulcerative lesion(s) b/l feet and painful mycotic toenails that limit ambulation. Painful toenails interfere with ambulation. Aggravating factors include wearing enclosed shoe gear. Pain is relieved with periodic professional debridement. Painful preulcerative lesion(s) is/are aggravated when weightbearing with and without shoegear. Pain is relieved with periodic professional debridement. .   Patient was brought to her appointment by her daughter in law who is waiting in the car. Regarding diabetic shoes, patient states she called [facility] and they quoted her a deductible amount and copay. She then called her insurance company and they gave her the name of a vendor and she cannot remember the name, but this vendor is in network with her insurance company. She states she will get the information to us   Patient The patient is having no constitutional symptoms, denying fever, chills, anorexia, or weight loss.  Current Outpatient Medications on File Prior to Visit  Medication Sig Dispense Refill   ACCU-CHEK AVIVA PLUS test strip TEST BLOOD SUGAR EVERY DAY 100 strip 10   Accu-Chek Softclix Lancets lancets CHECK BLOOD SUGAR EVERY DAY 100 each 6   acetaminophen  (TYLENOL ) 500 MG tablet Take 500 mg by mouth every 6 (six) hours as needed for moderate pain or headache.     alendronate  (FOSAMAX ) 70 MG tablet TAKE 1 TABLET (70 MG TOTAL) BY MOUTH ONCE A WEEK. TAKE WITH A FULL GLASS OF WATER ON AN EMPTY STOMACH. 12 tablet 3   amLODipine  (NORVASC ) 5 MG tablet TAKE 1 TABLET EVERY DAY 90 tablet 3   atorvastatin  (LIPITOR) 10 MG tablet TAKE 1 TABLET EVERY DAY 90 tablet 1   Blood Glucose  Monitoring Suppl (ACCU-CHEK GUIDE ME) w/Device KIT 1 Device by Does not apply route 4 (four) times daily as needed. 1 kit 0   Calcium  Carbonate (CALCIUM  600 PO) Take by mouth daily.     Cholecalciferol (VITAMIN D3) 50 MCG (2000 UT) capsule Take 2,000 Units by mouth daily.     hydrochlorothiazide  (HYDRODIURIL ) 25 MG tablet TAKE 1 TABLET EVERY DAY 90 tablet 3   Lancets MISC Accu-Check Guide Me Lancets 100 each 2   metFORMIN  (GLUCOPHAGE ) 500 MG tablet TAKE 4 TABLETS EVERY DAY 360 tablet 1   Multiple Vitamin (MULTIVITAMIN WITH MINERALS) TABS tablet Take 1 tablet by mouth daily.     mupirocin  ointment (BACTROBAN ) 2 % Apply to bottom of left foot once daily. 30 g 1   Polyethyl Glycol-Propyl Glycol (SYSTANE) 0.4-0.3 % SOLN Apply to eye.     potassium chloride  SA (KLOR-CON ) 20 MEQ tablet Take 1 tablet (20 mEq total) by mouth 2 (two) times daily. 30 tablet 4   vitamin B-12 (CYANOCOBALAMIN) 1000 MCG tablet Take 1,000 mcg by mouth daily.     No current facility-administered medications on file prior to visit.     Allergies  Allergen Reactions   Oxycodone  Nausea And Vomiting     Objective:  Physical Exam: There were no vitals filed for this visit.   [name] is a pleasant 78 y.o. African American female WD, WN in NAD. AAO x 3.  Vascular Examination: CFT <3 seconds b/l LE. Palpable DP/PT pulses b/l  LE. Digital hair absent b/l. Skin temperature gradient WNL b/l. No pain with calf compression b/l. No edema noted b/l. No cyanosis or clubbing noted b/l LE.  Dermatological Examination: {[id number]}       Wound Location: submet head 1 left foot There is a moderate amount of devitalized tissue present in the wound. Predebridement Wound Measurement:  1.0  x 1.0 x 0 cm. Postdebridement Wound Measurement: 1.0 x 2.5 x 0.3 cm. Wound Base: Granular/Healthy Peri-wound: Macerated Exudate: None: wound tissue dry Blood Loss during debridement: 1 cc('s). Sign(s) of clinical bacterial  infection: no clinical signs of infection noted on examination today.  Musculoskeletal Examination: Muscle strength 5/5 to all lower extremity muscle groups bilaterally. No pain, crepitus or joint limitation noted with ROM bilateral LE. Hallux hammertoe noted b/l LE. Hammertoe(s) 2-5 b/l.  Neurological Examination: Protective sensation intact 5/5 intact bilaterally with 10g monofilament b/l.  Labs:    Latest Ref Rng & Units 12/29/2023    2:09 PM 06/04/2023    2:25 PM  Hemoglobin A1C  Hemoglobin-A1c 4.6 - 6.5 % 6.5  6.2    Radiographs left foot:  No gas in tissues left foot. Vessel calcifications noted left foot. Evidence of fusion 1st MPJ. No bone erosion noted at location of ulceration submet head 1 left foot. No evidence of fracture left foot. No foreign body evident left foot.    Assessment:   1. Pain due to onychomycosis of toenails of both feet   2. Diabetic ulcer of left midfoot associated with diabetes mellitus due to underlying condition, with fat layer exposed (HCC)   3. Acquired hammertoes of both feet   4. Type 2 diabetes mellitus with diabetic autonomic neuropathy, without long-term current use of insulin (HCC)   Diabetic ulcer of left midfoot associated with diabetes mellitus due to underlying condition, with fat layer exposed (HCC) (Primary) - gentamicin cream (GARAMYCIN) 0.1 %; Apply to affected toe once daily.  Dispense: 30 g; Refill: 1 - doxycycline (VIBRAMYCIN) 100 MG capsule; Take 1 capsule (100 mg total) by mouth 2 (two) times daily for 10 days.  Dispense: 20 capsule; Refill: 0 - DG Foot Complete Left   Plan:  -Orders on today's visit:  Orders Placed This Encounter  Procedures   DG Foot Complete Left    Reason for Exam (SYMPTOM  OR DIAGNOSIS REQUIRED):   ulcer    Preferred imaging location?:   Internal    -Patient was evaluated and treated and all questions answered.  -Patient/POA/Family member educated on diagnosis and treatment plan of routine ulcer  debridement/wound care.  -Ulceration debridement achieved utilizing sharp excisional debridement with sterile scalpel blade and sterile currette.. Type/amount of devitalized tissue removed: necrotic tissue -Today's ulcer size post-debridement: 3.0 x 2.5 x 0.3 cm. -Ulceration cleansed with wound cleanser. triple antibiotic ointment applied to base of ulceration and secured with light dressing. -Wound responded well to today's debridement. -[name] written instructions on daily wound care for left foot ulceration. -Prescription written for Gentamicin Cream. Patient is to apply to left foot diabetic ulceration once daily and cover with dressing.  -Rx for Doxycyline 100 mg, #20, to be taken one capsule twice daily for 10 days. -Frequency of debridements needed to achieve healing: weekly to biweekly -Radiology ordered today: X-Ray: left foot 3 views. -Mycotic toenails 1-5 bilaterally were debrided in length and girth with sterile nail nippers and dremel without incident. -Preulcerative lesion pared dorsal PIPJ of right fifth digit utilizing sterile scalpel blade. Total number pared=1. -Patient/POA to  call should there be question/concern in the interim. Return in about 3 months (around 06/28/2024).  Delon LITTIE Merlin, DPM      [facility] LOCATION: [address] [place] [address]                   Office [phone]   [facility] LOCATION: [address] [place], [place] [address] Office [phone]

## 2024-03-28 NOTE — Patient Instructions (Signed)
   DRESSING CHANGES LEFT FOOT:   PHARMACY SHOPPING LIST: Saline or Wound Cleanser for cleaning wound 2 x 2 inch sterile gauze for cleaning wound GENTAMICIN CREAM  A. IF DISPENSED, WEAR SURGICAL SHOE OR WALKING BOOT AT ALL TIMES.  B. IF PRESCRIBED ORAL ANTIBIOTICS, TAKE ALL MEDICATION AS PRESCRIBED UNTIL ALL ARE GONE.   KEEP LEFT FOOT DRY AT ALL TIMES!!!!  CLEANSE ULCER WITH SALINE OR WOUND CLEANSER.  DAB DRY WITH GAUZE SPONGE.  APPLY A LIGHT AMOUNT OF GENTAMICIN CREAM TO BASE OF ULCER.  APPLY OUTER DRESSING AS INSTRUCTED.  WEAR SURGICAL SHOE/BOOT DAILY AT ALL TIMES. IF SUPPLIED, WEAR HEEL PROTECTORS AT ALL TIMES WHEN IN BED.  DO NOT WALK BAREFOOT!!!  IF YOU EXPERIENCE ANY FEVER, CHILLS, NIGHTSWEATS, NAUSEA OR VOMITING, ELEVATED OR LOW BLOOD SUGARS, REPORT TO EMERGENCY ROOM.  IF YOU EXPERIENCE INCREASED REDNESS, PAIN, SWELLING, DISCOLORATION, ODOR, PUS, DRAINAGE OR WARMTH OF YOUR FOOT, REPORT TO EMERGENCY ROOM.

## 2024-04-06 ENCOUNTER — Ambulatory Visit: Admitting: Podiatry

## 2024-04-06 VITALS — Ht 63.0 in | Wt 141.0 lb

## 2024-04-06 DIAGNOSIS — E08621 Diabetes mellitus due to underlying condition with foot ulcer: Secondary | ICD-10-CM | POA: Diagnosis not present

## 2024-04-06 DIAGNOSIS — M2062 Acquired deformities of toe(s), unspecified, left foot: Secondary | ICD-10-CM

## 2024-04-06 DIAGNOSIS — L97421 Non-pressure chronic ulcer of left heel and midfoot limited to breakdown of skin: Secondary | ICD-10-CM

## 2024-04-06 NOTE — Progress Notes (Signed)
  Subjective:  Patient ID: [name], female    DOB: 1945/08/31,  MRN: [id number]  Chief Complaint  Patient presents with   Wound Check    Rm 10 Patient is here for ulcer of left foot. Ulcer is scabbed over with no visible drainage. Patient is currently taking antibiotics for an infection associated with ulcer.    78 y.o. female presents with the above complaint. History confirmed with patient.   Objective:  Physical Exam: warm, good capillary refill, no trophic changes or ulcerative lesions, normal DP and PT pulses, reduced sensation at distal plantar toes and forefoot, and ulceration has healed.   Radiographs: Multiple views x-ray of the left foot: Prior radiographs shows previous MTP fusion Assessment:   1. Diabetic ulcer of left midfoot associated with diabetes mellitus due to underlying condition, limited to breakdown of skin (HCC)   2. Acquired deformity of joint of big toe of left foot      Plan:  Patient was evaluated and treated and all questions answered.  She returns for follow-up.  The ulceration has healed.  We discussed possibility of recurrence.  Long-term if this is a recurrent issue then sesamoidectomy may be beneficial for her as she has a fused first MTP joint and in no longer are functional.  I discussed with her that nonsurgically a custom molded foot orthosis for diabetic shoe would be beneficial for her.  We will send a referral to [facility] orthotics and prosthetics and she will follow with me as needed.  No follow-ups on file.

## 2024-04-07 ENCOUNTER — Other Ambulatory Visit: Payer: Self-pay | Admitting: Podiatry

## 2024-04-07 DIAGNOSIS — E1143 Type 2 diabetes mellitus with diabetic autonomic (poly)neuropathy: Secondary | ICD-10-CM

## 2024-04-07 DIAGNOSIS — Z8631 Personal history of diabetic foot ulcer: Secondary | ICD-10-CM

## 2024-04-07 DIAGNOSIS — M2062 Acquired deformities of toe(s), unspecified, left foot: Secondary | ICD-10-CM

## 2024-04-07 NOTE — Progress Notes (Unsigned)
 1. History of diabetic ulcer of foot   2. Acquired deformity of joint of big toe of left foot   3. Type 2 diabetes mellitus with diabetic autonomic neuropathy, without long-term current use of insulin (HCC)

## 2024-04-08 ENCOUNTER — Other Ambulatory Visit (INDEPENDENT_AMBULATORY_CARE_PROVIDER_SITE_OTHER): Payer: Self-pay | Admitting: Podiatry

## 2024-04-08 ENCOUNTER — Telehealth: Payer: Self-pay

## 2024-04-08 DIAGNOSIS — M2062 Acquired deformities of toe(s), unspecified, left foot: Secondary | ICD-10-CM

## 2024-04-08 DIAGNOSIS — M2041 Other hammer toe(s) (acquired), right foot: Secondary | ICD-10-CM

## 2024-04-08 DIAGNOSIS — E1143 Type 2 diabetes mellitus with diabetic autonomic (poly)neuropathy: Secondary | ICD-10-CM

## 2024-04-08 DIAGNOSIS — Z8631 Personal history of diabetic foot ulcer: Secondary | ICD-10-CM

## 2024-04-08 DIAGNOSIS — M2042 Other hammer toe(s) (acquired), left foot: Secondary | ICD-10-CM

## 2024-04-08 NOTE — Progress Notes (Signed)
 1. Type 2 diabetes mellitus with diabetic autonomic neuropathy, without long-term current use of insulin (HCC)   2. History of diabetic ulcer of foot   3. Acquired hammertoes of both feet   4. Acquired deformity of joint of big toe of left foot    Orders Placed This Encounter  Procedures   For home use only DME Other see comment    To [facility] of  [address] [place], [place] [address]  Dispense one pair extra depth shoes and 3 pair of custom molded insoles. Offload submetatarsal head 1 left foot.    Length of Need:   12 Months

## 2024-04-20 ENCOUNTER — Other Ambulatory Visit: Payer: Self-pay | Admitting: Nurse Practitioner

## 2024-04-20 DIAGNOSIS — E1143 Type 2 diabetes mellitus with diabetic autonomic (poly)neuropathy: Secondary | ICD-10-CM

## 2024-04-20 DIAGNOSIS — I159 Secondary hypertension, unspecified: Secondary | ICD-10-CM

## 2024-04-26 DIAGNOSIS — E113211 Type 2 diabetes mellitus with mild nonproliferative diabetic retinopathy with macular edema, right eye: Secondary | ICD-10-CM | POA: Diagnosis not present

## 2024-05-25 NOTE — Telephone Encounter (Signed)
 I called to provide information regarding [facility] per Dr. Gaynel reuest. The voicemail of her cell phone is full. I will mail the information to her. KDR  Outgoing call

## 2024-06-06 ENCOUNTER — Ambulatory Visit: Admitting: Podiatry

## 2024-06-06 DIAGNOSIS — B351 Tinea unguium: Secondary | ICD-10-CM | POA: Diagnosis not present

## 2024-06-06 DIAGNOSIS — M2041 Other hammer toe(s) (acquired), right foot: Secondary | ICD-10-CM | POA: Diagnosis not present

## 2024-06-06 DIAGNOSIS — M2042 Other hammer toe(s) (acquired), left foot: Secondary | ICD-10-CM

## 2024-06-06 DIAGNOSIS — M79674 Pain in right toe(s): Secondary | ICD-10-CM | POA: Diagnosis not present

## 2024-06-06 DIAGNOSIS — Z8631 Personal history of diabetic foot ulcer: Secondary | ICD-10-CM

## 2024-06-06 DIAGNOSIS — M79675 Pain in left toe(s): Secondary | ICD-10-CM | POA: Diagnosis not present

## 2024-06-06 DIAGNOSIS — L84 Corns and callosities: Secondary | ICD-10-CM | POA: Diagnosis not present

## 2024-06-06 DIAGNOSIS — E1143 Type 2 diabetes mellitus with diabetic autonomic (poly)neuropathy: Secondary | ICD-10-CM | POA: Diagnosis not present

## 2024-06-06 DIAGNOSIS — M2062 Acquired deformities of toe(s), unspecified, left foot: Secondary | ICD-10-CM | POA: Diagnosis not present

## 2024-06-07 ENCOUNTER — Encounter: Payer: Self-pay | Admitting: Podiatry

## 2024-06-07 NOTE — Progress Notes (Signed)
 "  Subjective:  Patient ID: [name], female    DOB: 04/24/1946,  MRN: [id number]  [name] presents to clinic today for at risk foot care with history of diabetic neuropathy and preulcerative lesion(s) left foot and painful mycotic toenails that limit ambulation. Painful toenails interfere with ambulation. Aggravating factors include wearing enclosed shoe gear. Pain is relieved with periodic professional debridement. Painful preulcerative lesion(s) is/are aggravated when weightbearing with and without shoegear. Pain is relieved with periodic professional debridement.   Patient has h/o ulceration submet head 1 left foot which has healed. Patient states [name] is requesting documentation for her diabetic shoes. Chief Complaint  Patient presents with   Nail Problem    Thick painful toenails, 3 month follow up    New problem(s): None.   PCP is Vincente Saber, NP.  Allergies[1]  Review of Systems: Negative except as noted in the HPI.  Objective: No changes noted in today's physical examination. There were no vitals filed for this visit. [name] is a pleasant 79 y.o. female WD, WN in NAD. AAO x 3.  Vascular Examination: CFT <3 seconds b/l LE. Palpable DP/PT pulses b/l LE. Digital hair absent b/l. Skin temperature gradient WNL b/l. No pain with calf compression b/l. No edema noted b/l. No cyanosis or clubbing noted b/l LE.  Dermatological Examination: Pedal skin is warm and supple b/l LE. Toenails 1-5 b/l elongated, discolored, dystrophic, thickened, crumbly with subungual debris and tenderness to dorsal palpation. Preulcerative lesion submet head 1, 4 left foot and noted dorsal PIPJ of R 5th toe. There is visible subdermal hemorrhage. There is no surrounding erythema, no edema, no drainage, no odor, no fluctuance.  Musculoskeletal Examination: Muscle strength 5/5 to all lower extremity muscle groups bilaterally. No pain, crepitus or joint limitation noted with ROM  bilateral LE. Hallux hammertoe noted b/l LE. Hammertoe(s) 2-5 b/l.  Neurological Examination: Protective sensation intact 5/5 intact bilaterally with 10g monofilament b/l.  Assessment/Plan: 1. Pain due to onychomycosis of toenails of both feet   2. Pre-ulcerative calluses   3. Acquired deformity of joint of big toe of left foot   4. Acquired hammertoes of both feet   5. History of diabetic ulcer of foot   6. Type 2 diabetes mellitus with diabetic autonomic neuropathy, without long-term current use of insulin (HCC)    Orders Placed This Encounter  Procedures   For home use only DME Other see comment    To [facility]: Dispense one pair extra depth shoes and 3 pair custom insoles. Offload plantar preulcerative calluses submet head 1, 4 left foot. Patient has history of ulceration submet head 1 left foot.    Length of Need:   12 Months    Patient was evaluated and treated. All patient's and/or POA's questions/concerns addressed on today's visit. Mycotic toenails 1-5 b/l debrided in length and girth without incident. Preulcerative lesion(s) submet head 1 left foot and submet head 4 left foot pared with sharp debridement without incident. Current insole left foot offloaded with dancer's pad. Continue daily foot inspections and monitor blood glucose per PCP/Endocrinologist's recommendations. Continue soft, supportive shoe gear daily. Report any pedal injuries to medical professional. Call office if there are any questions/concerns. -Rx to be sent to [facility] dispensed to patient for one pair diabetic shoes and 3 pair custom insoles. -Patient/POA to call should there be question/concern in the interim.   Return in about 9 weeks (around 08/08/2024).  [name], DPM  [facility] LOCATION: [address], [place] [address]                   Office [phone]   [place]  LOCATION: [address] [place], [place] [address] Office [phone]     [1]  Allergies Allergen Reactions   Oxycodone  Nausea And Vomiting   "

## 2024-06-15 ENCOUNTER — Telehealth: Payer: Self-pay

## 2024-06-15 NOTE — Telephone Encounter (Signed)
 Called Patient to schedule a diabetes follow up due to [employer] requesting updated diabetes management office notes with in the last 4 months. [facility] is aware that Patient is scheduled to see Chelsea Aurora on 07/22/24 and that paperwork will be faxed after visit.

## 2024-06-16 NOTE — Telephone Encounter (Signed)
 Noted! Thank you

## 2024-07-06 ENCOUNTER — Ambulatory Visit: Payer: Medicare HMO

## 2024-07-11 ENCOUNTER — Ambulatory Visit

## 2024-07-22 ENCOUNTER — Ambulatory Visit: Admitting: Nurse Practitioner

## 2024-08-11 ENCOUNTER — Ambulatory Visit: Admitting: Podiatry
